# Patient Record
Sex: Female | Born: 1955 | Race: White | Hispanic: No | Marital: Single | Smoking: Former smoker
Health system: Southern US, Community
[De-identification: ages and names within clinical notes are randomized; demographics above are authoritative.]

## PROBLEM LIST (undated history)

## (undated) DIAGNOSIS — I10 Essential (primary) hypertension: Secondary | ICD-10-CM

## (undated) DIAGNOSIS — F200 Paranoid schizophrenia: Secondary | ICD-10-CM

## (undated) DIAGNOSIS — I872 Venous insufficiency (chronic) (peripheral): Secondary | ICD-10-CM

## (undated) DIAGNOSIS — I2699 Other pulmonary embolism without acute cor pulmonale: Secondary | ICD-10-CM

## (undated) DIAGNOSIS — E871 Hypo-osmolality and hyponatremia: Secondary | ICD-10-CM

---

## 2000-09-18 ENCOUNTER — Encounter: Admission: RE | Admit: 2000-09-18 | Discharge: 2000-09-18 | Payer: Self-pay | Admitting: Internal Medicine

## 2000-09-18 ENCOUNTER — Encounter: Payer: Self-pay | Admitting: Internal Medicine

## 2001-08-24 ENCOUNTER — Encounter: Admission: RE | Admit: 2001-08-24 | Discharge: 2001-08-24 | Payer: Self-pay | Admitting: Internal Medicine

## 2001-09-04 ENCOUNTER — Ambulatory Visit: Admission: RE | Admit: 2001-09-04 | Discharge: 2001-09-04 | Payer: Self-pay | Admitting: Internal Medicine

## 2003-04-18 ENCOUNTER — Other Ambulatory Visit: Admission: RE | Admit: 2003-04-18 | Discharge: 2003-04-18 | Payer: Self-pay | Admitting: Internal Medicine

## 2003-04-22 ENCOUNTER — Encounter: Admission: RE | Admit: 2003-04-22 | Discharge: 2003-04-22 | Payer: Self-pay | Admitting: Internal Medicine

## 2003-04-22 ENCOUNTER — Encounter: Payer: Self-pay | Admitting: Internal Medicine

## 2004-04-04 ENCOUNTER — Encounter: Admission: RE | Admit: 2004-04-04 | Discharge: 2004-04-04 | Payer: Self-pay | Admitting: Internal Medicine

## 2004-04-25 ENCOUNTER — Encounter: Admission: RE | Admit: 2004-04-25 | Discharge: 2004-04-25 | Payer: Self-pay | Admitting: Internal Medicine

## 2004-06-26 ENCOUNTER — Other Ambulatory Visit: Admission: RE | Admit: 2004-06-26 | Discharge: 2004-06-26 | Payer: Self-pay | Admitting: Internal Medicine

## 2005-08-08 ENCOUNTER — Other Ambulatory Visit: Admission: RE | Admit: 2005-08-08 | Discharge: 2005-08-08 | Payer: Self-pay | Admitting: Internal Medicine

## 2005-08-15 ENCOUNTER — Encounter: Admission: RE | Admit: 2005-08-15 | Discharge: 2005-08-15 | Payer: Self-pay | Admitting: Internal Medicine

## 2006-09-23 ENCOUNTER — Other Ambulatory Visit: Admission: RE | Admit: 2006-09-23 | Discharge: 2006-09-23 | Payer: Self-pay | Admitting: *Deleted

## 2006-11-03 ENCOUNTER — Other Ambulatory Visit: Admission: RE | Admit: 2006-11-03 | Discharge: 2006-11-03 | Payer: Self-pay | Admitting: Obstetrics and Gynecology

## 2007-10-28 ENCOUNTER — Other Ambulatory Visit: Admission: RE | Admit: 2007-10-28 | Discharge: 2007-10-28 | Payer: Self-pay | Admitting: *Deleted

## 2010-12-21 NOTE — Procedures (Signed)
[facility]  Patient:    Nancy Erickson, Nancy Erickson Visit Number: [id number] MRN: [id number]          Service Type: DSU Location: CARD Attending Physician:  Avie Echevaria Dictated by:   Charlaine Dalton. Sherene Sires, M.D. [facility] Proc. Date: 09/04/01 Admit Date:  09/04/2001                             Procedure Report  REFERRING PHYSICIAN:  Self referred.  HISTORY:  This is a 55 year old white female, smoker, with a history of chronic psychosis and atypical cough that I thought was probably upper airway in nature. She has had a normal chest x-ray and CT scan done within the past several weeks and continues to have coughing paroxysms, hoarseness, and a sensation of "swelling in her throat" that I though was probably, again, reflux related but might be functional but because she was a smoker, wanted to exclude the possibility of an upper airway mechanical abnormality or neoplasm. I discussed these issues further with the patient and her mother on August 18, 2001 and obtained consent for a procedure today after full discussion of risks, benefits, and alternatives.  PROCEDURE:  Fiberoptic bronchoscopy.  DESCRIPTION OF PROCEDURE:  The procedure was performed in the bronchoscopy suite with continuous monitoring by surface ECG and oximetry. The patient maintained greater than 90% saturation throughout the procedure in sinus rhythm. She received a total of 5 mg of IV Versed and 50 mg IV Demerol for adequate sedation and cough suppression.  The patient was premedicated with 1% lidocaine aerosol by _______ nebulizer. The right nares was additionally prepared with 2% lidocaine jelly.  Using a standard video fiberoptic bronchoscope, the right nares was easily cannulated with good visualization of the entire oropharynx and larynx. The cords moved normally on inspiration. On expiration, there was marked adduction of the vocal cords to the point where she had classic  "pseudo-wheeze" visualized that corresponded to upper airway wheezing that was audible with and without the stethoscope. In the absence of the vocal cord adduction that occurred paroxysmally on expiration, there was no wheezing at all nor any coughing.  However, the larynx and vocal cords were perfectly normal otherwise.  Using an additional 1% lidocaine as indicated, the entire tracheobronchial tree was explored bilaterally with the following findings: 1. Trachea, carina, and all the major airways to the subsegmental level were    widely patent with normal mucosa, no evidence of excessive secretions or    mucosal abnormalities to explain her chronic cough.  IMPRESSION:  Probable functional upper airway coughing/vocal cord dysfunction.  RECOMMENDATION:  The patient will be taught to how to use a flutter valve to help during paroxysms of cough and dyspnea and will see her back in the office in the next one to two weeks to see how she is doing with this. If this is not helpful, refer to the [facility] will be considered. Dictated by:   Charlaine Dalton. Sherene Sires, M.D. [facility] Attending Physician:  Avie Echevaria DD:  09/04/01 TD:  09/05/01 Job: [id number]

## 2011-09-04 DIAGNOSIS — F209 Schizophrenia, unspecified: Secondary | ICD-10-CM | POA: Diagnosis not present

## 2011-09-24 DIAGNOSIS — Z79899 Other long term (current) drug therapy: Secondary | ICD-10-CM | POA: Diagnosis not present

## 2012-03-10 DIAGNOSIS — F209 Schizophrenia, unspecified: Secondary | ICD-10-CM | POA: Diagnosis not present

## 2012-05-13 DIAGNOSIS — Z23 Encounter for immunization: Secondary | ICD-10-CM | POA: Diagnosis not present

## 2012-06-09 DIAGNOSIS — F209 Schizophrenia, unspecified: Secondary | ICD-10-CM | POA: Diagnosis not present

## 2012-09-08 DIAGNOSIS — F209 Schizophrenia, unspecified: Secondary | ICD-10-CM | POA: Diagnosis not present

## 2012-09-11 DIAGNOSIS — L259 Unspecified contact dermatitis, unspecified cause: Secondary | ICD-10-CM | POA: Diagnosis not present

## 2012-12-08 DIAGNOSIS — F209 Schizophrenia, unspecified: Secondary | ICD-10-CM | POA: Diagnosis not present

## 2013-03-02 DIAGNOSIS — F209 Schizophrenia, unspecified: Secondary | ICD-10-CM | POA: Diagnosis not present

## 2013-06-01 DIAGNOSIS — F209 Schizophrenia, unspecified: Secondary | ICD-10-CM | POA: Diagnosis not present

## 2013-10-11 ENCOUNTER — Inpatient Hospital Stay (HOSPITAL_COMMUNITY)
Admission: EM | Admit: 2013-10-11 | Discharge: 2013-10-14 | DRG: 305 | Disposition: A | Discharge status: Home / Self Care | Payer: Medicare Other | Attending: Family Medicine | Admitting: Family Medicine

## 2013-10-11 ENCOUNTER — Encounter (HOSPITAL_COMMUNITY): Payer: Self-pay | Admitting: Emergency Medicine

## 2013-10-11 ENCOUNTER — Emergency Department (HOSPITAL_COMMUNITY): Payer: Medicare Other

## 2013-10-11 DIAGNOSIS — Z885 Allergy status to narcotic agent status: Secondary | ICD-10-CM | POA: Diagnosis not present

## 2013-10-11 DIAGNOSIS — I498 Other specified cardiac arrhythmias: Secondary | ICD-10-CM | POA: Diagnosis present

## 2013-10-11 DIAGNOSIS — R4182 Altered mental status, unspecified: Secondary | ICD-10-CM | POA: Diagnosis not present

## 2013-10-11 DIAGNOSIS — Z79899 Other long term (current) drug therapy: Secondary | ICD-10-CM | POA: Diagnosis not present

## 2013-10-11 DIAGNOSIS — Z598 Other problems related to housing and economic circumstances: Secondary | ICD-10-CM | POA: Diagnosis not present

## 2013-10-11 DIAGNOSIS — Z888 Allergy status to other drugs, medicaments and biological substances status: Secondary | ICD-10-CM

## 2013-10-11 DIAGNOSIS — F250 Schizoaffective disorder, bipolar type: Secondary | ICD-10-CM | POA: Diagnosis not present

## 2013-10-11 DIAGNOSIS — I10 Essential (primary) hypertension: Secondary | ICD-10-CM | POA: Diagnosis not present

## 2013-10-11 DIAGNOSIS — R000 Tachycardia, unspecified: Secondary | ICD-10-CM

## 2013-10-11 DIAGNOSIS — I160 Hypertensive urgency: Secondary | ICD-10-CM | POA: Diagnosis present

## 2013-10-11 DIAGNOSIS — R631 Polydipsia: Secondary | ICD-10-CM | POA: Diagnosis present

## 2013-10-11 DIAGNOSIS — F209 Schizophrenia, unspecified: Secondary | ICD-10-CM | POA: Diagnosis not present

## 2013-10-11 DIAGNOSIS — F200 Paranoid schizophrenia: Secondary | ICD-10-CM | POA: Diagnosis not present

## 2013-10-11 DIAGNOSIS — F259 Schizoaffective disorder, unspecified: Secondary | ICD-10-CM | POA: Diagnosis present

## 2013-10-11 DIAGNOSIS — E871 Hypo-osmolality and hyponatremia: Secondary | ICD-10-CM | POA: Diagnosis present

## 2013-10-11 DIAGNOSIS — F17200 Nicotine dependence, unspecified, uncomplicated: Secondary | ICD-10-CM | POA: Diagnosis present

## 2013-10-11 DIAGNOSIS — I517 Cardiomegaly: Secondary | ICD-10-CM | POA: Diagnosis not present

## 2013-10-11 DIAGNOSIS — F29 Unspecified psychosis not due to a substance or known physiological condition: Secondary | ICD-10-CM | POA: Diagnosis not present

## 2013-10-11 DIAGNOSIS — Z5987 Material hardship due to limited financial resources, not elsewhere classified: Secondary | ICD-10-CM

## 2013-10-11 DIAGNOSIS — R0602 Shortness of breath: Secondary | ICD-10-CM | POA: Diagnosis not present

## 2013-10-11 HISTORY — DX: Paranoid schizophrenia: F20.0

## 2013-10-11 LAB — CBC WITH DIFFERENTIAL/PLATELET
Basophils Absolute: 0 10*3/uL (ref 0.0–0.1)
Basophils Relative: 0 % (ref 0–1)
Eosinophils Absolute: 0.1 10*3/uL (ref 0.0–0.7)
Eosinophils Relative: 1 % (ref 0–5)
HCT: 39 % (ref 36.0–46.0)
Hemoglobin: 14.2 g/dL (ref 12.0–15.0)
Lymphocytes Relative: 24 % (ref 12–46)
Lymphs Abs: 2 K/uL (ref 0.7–4.0)
MCH: 30.7 pg (ref 26.0–34.0)
MCHC: 36.4 g/dL — ABNORMAL HIGH (ref 30.0–36.0)
MCV: 84.4 fL (ref 78.0–100.0)
Monocytes Absolute: 0.6 K/uL (ref 0.1–1.0)
Monocytes Relative: 8 % (ref 3–12)
Neutro Abs: 5.6 10*3/uL (ref 1.7–7.7)
Neutrophils Relative %: 67 % (ref 43–77)
Platelets: 427 10*3/uL — ABNORMAL HIGH (ref 150–400)
RBC: 4.62 MIL/uL (ref 3.87–5.11)
RDW: 13.4 % (ref 11.5–15.5)
WBC: 8.3 10*3/uL (ref 4.0–10.5)

## 2013-10-11 LAB — BASIC METABOLIC PANEL
BUN: <3 mg/dL — ABNORMAL LOW (ref 6–23)
CHLORIDE: 99 meq/L (ref 96–112)
CO2: 28 meq/L (ref 19–32)
Calcium: 9.3 mg/dL (ref 8.4–10.5)
Creatinine, Ser: 0.55 mg/dL (ref 0.50–1.10)
GFR calc Af Amer: >90 mL/min (ref >90)
GFR calc non Af Amer: >90 mL/min (ref >90)
Glucose, Bld: 109 mg/dL — ABNORMAL HIGH (ref 70–99)
POTASSIUM: 3.8 meq/L (ref 3.7–5.3)
Sodium: 138 mEq/L (ref 137–147)

## 2013-10-11 LAB — I-STAT CHEM 8, ED
BUN: <3 mg/dL — ABNORMAL LOW (ref 6–23)
Calcium, Ion: 1.13 mmol/L (ref 1.12–1.23)
Chloride: 91 mEq/L — ABNORMAL LOW (ref 96–112)
Creatinine, Ser: 0.5 mg/dL (ref 0.50–1.10)
Glucose, Bld: 116 mg/dL — ABNORMAL HIGH (ref 70–99)
HCT: 44 % (ref 36.0–46.0)
Hemoglobin: 15 g/dL (ref 12.0–15.0)
Potassium: 3.6 meq/L — ABNORMAL LOW (ref 3.7–5.3)
Sodium: 129 meq/L — ABNORMAL LOW (ref 137–147)
TCO2: 26 mmol/L (ref 0–100)

## 2013-10-11 LAB — COMPREHENSIVE METABOLIC PANEL
ALT: 21 U/L (ref 0–35)
AST: 21 U/L (ref 0–37)
CO2: 24 mEq/L (ref 19–32)
Chloride: 89 mEq/L — ABNORMAL LOW (ref 96–112)
GFR calc non Af Amer: >90 mL/min (ref >90)
Sodium: 128 mEq/L — ABNORMAL LOW (ref 137–147)
Total Bilirubin: 0.4 mg/dL (ref 0.3–1.2)

## 2013-10-11 LAB — COMPREHENSIVE METABOLIC PANEL WITH GFR
Albumin: 4.1 g/dL (ref 3.5–5.2)
Alkaline Phosphatase: 68 U/L (ref 39–117)
BUN: <3 mg/dL — ABNORMAL LOW (ref 6–23)
Calcium: 9.4 mg/dL (ref 8.4–10.5)
Creatinine, Ser: 0.48 mg/dL — ABNORMAL LOW (ref 0.50–1.10)
GFR calc Af Amer: >90 mL/min (ref >90)
Glucose, Bld: 114 mg/dL — ABNORMAL HIGH (ref 70–99)
Potassium: 3.8 meq/L (ref 3.7–5.3)
Total Protein: 6.9 g/dL (ref 6.0–8.3)

## 2013-10-11 LAB — RAPID URINE DRUG SCREEN, HOSP PERFORMED
AMPHETAMINES: NOT DETECTED
Barbiturates: NOT DETECTED
Benzodiazepines: NOT DETECTED
COCAINE: NOT DETECTED
OPIATES: NOT DETECTED
Tetrahydrocannabinol: NOT DETECTED

## 2013-10-11 LAB — URINALYSIS, ROUTINE W REFLEX MICROSCOPIC
BILIRUBIN URINE: NEGATIVE
Glucose, UA: NEGATIVE mg/dL
HGB URINE DIPSTICK: NEGATIVE
Ketones, ur: NEGATIVE mg/dL
Leukocytes, UA: NEGATIVE
Nitrite: NEGATIVE
PROTEIN: NEGATIVE mg/dL
Specific Gravity, Urine: 1.005 (ref 1.005–1.030)
Urobilinogen, UA: 0.2 mg/dL (ref 0.0–1.0)
pH: 7.5 (ref 5.0–8.0)

## 2013-10-11 LAB — ETHANOL

## 2013-10-11 LAB — POC URINE PREG, ED: Preg Test, Ur: NEGATIVE

## 2013-10-11 MED ORDER — LORAZEPAM 2 MG/ML IJ SOLN
0.5000 mg | Freq: Once | INTRAMUSCULAR | Status: AC
  Administered 2013-10-11: 0.5 mg via INTRAVENOUS
  Filled 2013-10-11: qty 1

## 2013-10-11 MED ORDER — ACETAMINOPHEN 650 MG RE SUPP: 650.0000 mg | Freq: Four times a day (QID) | RECTAL | Status: DC | PRN

## 2013-10-11 MED ORDER — HEPARIN SODIUM (PORCINE) 5000 UNIT/ML IJ SOLN
5000.0000 [IU] | Freq: Three times a day (TID) | INTRAMUSCULAR | Status: DC
  Administered 2013-10-12 – 2013-10-13 (×3): 5000 [IU] via SUBCUTANEOUS
  Filled 2013-10-11 (×10): qty 1

## 2013-10-11 MED ORDER — LORAZEPAM 2 MG/ML IJ SOLN
1.0000 mg | Freq: Once | INTRAMUSCULAR | Status: AC
  Administered 2013-10-11: 20:00:00 via INTRAVENOUS
  Filled 2013-10-11: qty 1

## 2013-10-11 MED ORDER — SODIUM CHLORIDE 0.9 % IV BOLUS (SEPSIS)
1000.0000 mL | Freq: Once | INTRAVENOUS | Status: AC
  Administered 2013-10-11: 1000 mL via INTRAVENOUS

## 2013-10-11 MED ORDER — BENZTROPINE MESYLATE 1 MG PO TABS
1.0000 mg | ORAL_TABLET | Freq: Every day | ORAL | Status: DC
  Administered 2013-10-12 – 2013-10-14 (×3): 1 mg via ORAL
  Filled 2013-10-11 (×3): qty 1

## 2013-10-11 MED ORDER — ACETAMINOPHEN 325 MG PO TABS: 650.0000 mg | ORAL_TABLET | Freq: Four times a day (QID) | ORAL | Status: DC | PRN

## 2013-10-11 MED ORDER — ONDANSETRON HCL 4 MG PO TABS: 4.0000 mg | ORAL_TABLET | Freq: Four times a day (QID) | ORAL | Status: DC | PRN

## 2013-10-11 MED ORDER — ONDANSETRON HCL 4 MG/2ML IJ SOLN: 4.0000 mg | Freq: Four times a day (QID) | INTRAMUSCULAR | Status: DC | PRN

## 2013-10-11 MED ORDER — SODIUM CHLORIDE 0.9 % IJ SOLN
3.0000 mL | Freq: Two times a day (BID) | INTRAMUSCULAR | Status: DC
  Administered 2013-10-11 – 2013-10-13 (×5): 3 mL via INTRAVENOUS

## 2013-10-11 MED ORDER — HYDRALAZINE HCL 20 MG/ML IJ SOLN
10.0000 mg | INTRAMUSCULAR | Status: DC | PRN
  Administered 2013-10-11: 22:00:00 via INTRAVENOUS
  Filled 2013-10-11: qty 1

## 2013-10-11 MED ORDER — METOPROLOL TARTRATE 1 MG/ML IV SOLN
5.0000 mg | Freq: Once | INTRAVENOUS | Status: AC
  Administered 2013-10-11: 5 mg via INTRAVENOUS
  Filled 2013-10-11: qty 5

## 2013-10-11 MED ORDER — METOPROLOL TARTRATE 1 MG/ML IV SOLN: 5.0000 mg | Freq: Four times a day (QID) | INTRAVENOUS | Status: DC | PRN

## 2013-10-11 MED ORDER — LABETALOL HCL 100 MG PO TABS
100.0000 mg | ORAL_TABLET | Freq: Once | ORAL | Status: AC
  Administered 2013-10-11: 100 mg via ORAL
  Filled 2013-10-11: qty 1

## 2013-10-11 MED ORDER — AMLODIPINE BESYLATE 5 MG PO TABS
5.0000 mg | ORAL_TABLET | Freq: Every day | ORAL | Status: DC
  Administered 2013-10-11 – 2013-10-13 (×3): 5 mg via ORAL
  Filled 2013-10-11 (×3): qty 1

## 2013-10-11 MED ORDER — RISPERIDONE 3 MG PO TABS
6.0000 mg | ORAL_TABLET | Freq: Every day | ORAL | Status: DC
  Administered 2013-10-12 – 2013-10-13 (×2): 6 mg via ORAL
  Filled 2013-10-11 (×3): qty 2

## 2013-10-11 NOTE — ED Notes (Addendum)
Pt presents to department for evaluation of hypertension and confusion. History of paranoid schizophrenia. Family states she is more confused than normal, noticed over the past week, family is concerned she has not been taking medications as directed. Pt has chronic cough and speaking difficulties, but this is normal baseline. Denies pain. Pt is alert, but confused.

## 2013-10-11 NOTE — ED Notes (Signed)
Patel, MD at bedside.  

## 2013-10-11 NOTE — ED Notes (Signed)
[facility]  Pt to go to floor on monitor with EDT accompanying.

## 2013-10-11 NOTE — ED Notes (Signed)
Pt voided >800 ml urine which fell from commode to floor spilling all over one half of the floor from head to foot of bed and to wall.

## 2013-10-11 NOTE — H&P (Signed)
[facility] History and Physical  Patient: Nancy Erickson  ZOX:[id number]  DOB: Jun 27, 1956  DOS: the patient was seen and examined on 10/11/2013 PCP: No primary provider on file.  Chief Complaint: Altered mental status  HPI: Nancy Erickson is a 58 y.o. female with Past medical history of paranoid schizophrenia. The patient is coming from supervised group living facility. The history was obtained from patient as well as Merchandiser, [employer]. As per the supervisor since last one week the patient has not been acting herself. She is more confused. She is knocking on her neighbors to her every night multiple times asking for cigarettes, despite informing her that the neighbors do not have it. She will be out in cold without any protective clothing. She has a prescription that should continue until 20th of March, but she is on her last medications today. She has been having episodes of vomiting. No incontinence noted no syncope or passing episode. No seizure-like activity noted. Patient today was more agitated and was taken to urgent care and was found to have significantly elevated blood pressure and then brought here. There is no alcohol, cigarettes, othe prescription medications identified in her apartment.  Review of Systems: as mentioned in the history of present illness.  A Comprehensive review of the other systems is negative.  Past Medical History  Diagnosis Date  . Paranoid schizophrenia    History reviewed. No pertinent past surgical history. Social History:  reports that she has been smoking Cigarettes.  She has been smoking about 0.00 packs per day. She does not have any smokeless tobacco history on file. She reports that she does not drink alcohol or use illicit drugs. Independent for most of her  ADL.  Allergies  Allergen Reactions  . Phosphate Nausea And Vomiting    No family history on file.  Prior to Admission medications   Medication Sig Start Date End Date Taking?  Authorizing Provider  benztropine (COGENTIN) 1 MG tablet Take 1 mg by mouth daily. 09/23/13  Yes Historical Provider, MD  Multiple Vitamins-Minerals (MULTIVITAMIN WITH MINERALS) tablet Take 1 tablet by mouth daily.   Yes Historical Provider, MD  risperiDONE (RISPERDAL) 3 MG tablet Take 6 mg by mouth at bedtime.  09/22/13  Yes Historical Provider, MD    Physical Exam: Filed Vitals:   10/11/13 2140 10/11/13 2145 10/11/13 2215 10/11/13 2216  BP: 190/94 177/95 193/91 193/91  Pulse:  100  106  Temp:      TempSrc:      Resp: 16 28  28   SpO2: 98% 95%  96%    General: Alert, Awake and Oriented to Time, Place and Person. Appear in mild distress, Slurred speech which is chronic. Eyes: PERRL ENT: Oral Mucosa clear moist. Neck: No JVD Cardiovascular: S1 and S2 Present, no Murmur, Peripheral Pulses Present Respiratory: Bilateral Air entry equal and Decreased, Clear to Auscultation,  No Crackles, no wheezes Abdomen: Bowel Sound Present, Soft and Non tender Skin: No Rash Extremities: No Pedal edema, no calf tenderness Neurologic: Grossly Unremarkable. Labs on Admission:  CBC:  Recent Labs Lab 10/11/13 1417 10/11/13 1440  WBC 8.3  --   NEUTROABS 5.6  --   HGB 14.2 15.0  HCT 39.0 44.0  MCV 84.4  --   PLT 427*  --     CMP     Component Value Date/Time   NA 138 10/11/2013 2133   K 3.8 10/11/2013 2133   CL 99 10/11/2013 2133   CO2 28 10/11/2013 2133   GLUCOSE 109* 10/11/2013  2133   BUN <3* 10/11/2013 2133   CREATININE 0.55 10/11/2013 2133   CALCIUM 9.3 10/11/2013 2133   PROT 6.9 10/11/2013 1417   ALBUMIN 4.1 10/11/2013 1417   AST 21 10/11/2013 1417   ALT 21 10/11/2013 1417   ALKPHOS 68 10/11/2013 1417   BILITOT 0.4 10/11/2013 1417   GFRNONAA >90 10/11/2013 2133   GFRAA >90 10/11/2013 2133    No results found for this basename: LIPASE, AMYLASE,  in the last 168 hours No results found for this basename: AMMONIA,  in the last 168 hours  No results found for this basename: CKTOTAL, CKMB, CKMBINDEX,  TROPONINI,  in the last 168 hours BNP (last 3 results) No results found for this basename: PROBNP,  in the last 8760 hours  Radiological Exams on Admission: Ct Head Wo Contrast  10/11/2013   CLINICAL DATA:  Hypertension and confusion. History of paranoid schizophrenia.  EXAM: CT HEAD WITHOUT CONTRAST  TECHNIQUE: Contiguous axial images were obtained from the base of the skull through the vertex without intravenous contrast.  COMPARISON:  No priors.  FINDINGS: In the right temporal lobe (images 9-12 of series 2) there are areas of low attenuation, with apparent loss of gray-white differentiation. This region is somewhat affected by beam hardening artifact from skullbase, however, the possibility of edema from subacute ischemia in this region is difficult to entirely exclude. The remainder the brain is otherwise normal in appearance. Specifically, no signs of acute intracranial hemorrhage. No definite mass or mass effect. No hydrocephalus. Visualized paranasal sinuses and mastoids are well pneumatized. No acute displaced skull fractures are identified.  IMPRESSION: 1. Findings that could potentially be seen in the setting of subacute ischemia in the right MCA distribution, specifically in the right temporal lobe, as above. Although this may be artifactual, clinical correlation is recommended, and if there is any clinical concern for acute ischemia in this region, further evaluation with brain MRI would be recommended. These results were called by telephone at the time of interpretation on 10/11/2013 at 4:14 PM to Dr. Glynn OctaveSTEPHEN RANCOUR, who verbally acknowledged these results.   Electronically Signed   By: Trudie Reedaniel  Entrikin M.D.   On: 10/11/2013 16:17   Mr Brain Wo Contrast  10/11/2013   CLINICAL DATA:  Altered mental status.  Schizophrenia.  Abnormal CT  EXAM: MRI HEAD WITHOUT CONTRAST  TECHNIQUE: Multiplanar, multiecho pulse sequences of the brain and surrounding structures were obtained without intravenous  contrast.  COMPARISON:  CT head from today  FINDINGS: Incomplete study. The patient was unable to tolerate scanning. Sagittal T1, axial and coronal diffusion-weighted imaging was performed. No T2 weighted imaging. There is motion on the images  Negative for acute infarct. Ventricle size is normal. Right temporal lobe appears normal on diffusion-weighted imaging. Obviously a more complete examination would be helpful however I feel based on the study that the right temporal lobe hypodensity on CT is most likely due to artifact. If symptoms persist, repeat CT or MRI could be performed.  IMPRESSION: No acute abnormality.  No areas of acute infarct identified.  Incomplete study   Electronically Signed   By: Marlan Palauharles  Clark M.D.   On: 10/11/2013 18:24    EKG: Independently reviewed. normal EKG, normal sinus rhythm, sinus tachycardia.  Assessment/Plan Principal Problem:   Hypertensive urgency Active Problems:   Schizo-affective schizophrenia   Hyponatremia   Altered mental state   1. Hypertensive urgency The patient is presenting with significant able to blood pressure with sinus tachycardia. Although she does not  appear to have any signs of withdrawal or other symptoms suggesting serotonin syndrome or any other etiologies. It is also unclear whether she has chronic hypertension which has not been treated. At present I would place her on IV hydralazine IV Lopressor as needed. I will give her one dose of oral labetalol and one dose of amlodipine. U. tox is negative for any substance. CT head is negative for any stroke. Initial troponin is negative. Once medically clear may need psychiatric consultation for schizoaffective disorder.  2. Hyponatremia The supervisor reports that the patient drinks 4 L of soda on a daily basis. I will recheck her BMP. Neuro checks every 4 hours  3. Altered mental status At present the patient appears to be at her baseline pleasantly confused. May need psychiatric  consultation. CT scan is normal urine analysis is normal U. tox is normal LFTs are normal.   DVT Prophylaxis: subcutaneous Heparin Nutrition: Advance as tolerated  Code Status: Full  Family Communication: Brother was present at bedside, opportunity was given to ask question and all questions were answered satisfactorily at the time of interview. Disposition: Admitted to observation in telemetry unit.  Author: Lynden Oxford, MD [facility] Pager: [phone] 10/11/2013, 10:20 PM    If 7PM-7AM, please contact night-coverage [email or web address] Password [email or web address]

## 2013-10-11 NOTE — ED Provider Notes (Signed)
CSN: [id number]     Arrival date & time 10/11/13  1401 History   First MD Initiated Contact with Patient 10/11/13 1504     Chief Complaint  Patient presents with  . Hypertension  . Altered Mental Status     (Consider location/radiation/quality/duration/timing/severity/associated sxs/prior Treatment) Patient is a 58 y.o. female presenting with hypertension and altered mental status. The history is provided by the patient, a caregiver and medical records. No language interpreter was used.  Hypertension Pertinent negatives include no abdominal pain, chest pain, coughing, diaphoresis, fatigue, fever, headaches, nausea, rash or vomiting.  Altered Mental Status Associated symptoms: no abdominal pain, no fever, no headaches, no light-headedness, no nausea, no rash and no vomiting     Lizvette Lightsey is a 58 y.o. female  with a hx of paranoid schizophrenia presents to the Emergency Department with her community mentor after being found more altered than normal onset 1 week ago.  Community mentor reports that pt has only 1 more day of her Risperidone and benzotripine RX, but they were not due to be refilled until 10/22/13.  Mentor reports that people close to her have reported that she has not been herself.  Mentor reports that she has been hospitalized for her schizophrenia before, but it has been many years since.  Mentor also reports that patient has been throwing things away this week like her cell phone, therefore it is unclear whether or not the patient has taken too much medication or not enough this week.  Pt denies all somatic complaints and reports she is taking her medications.  She denies drug or EtOH use. Pt able to provide very little of her own Hx due to her psychosis - Level 5 caveat.    Past Medical History  Diagnosis Date  . Paranoid schizophrenia    History reviewed. No pertinent past surgical history. No family history on file. History  Substance Use Topics  . Smoking status:  Current Every Day Smoker    Types: Cigarettes  . Smokeless tobacco: Not on file  . Alcohol Use: No   OB History   Grav Para Term Preterm Abortions TAB SAB Ect Mult Living                 Review of Systems  Constitutional: Negative for fever, diaphoresis, appetite change, fatigue and unexpected weight change.  HENT: Negative for mouth sores.   Eyes: Negative for visual disturbance.  Respiratory: Negative for cough, chest tightness, shortness of breath and wheezing.   Cardiovascular: Negative for chest pain.  Gastrointestinal: Negative for nausea, vomiting, abdominal pain, diarrhea and constipation.  Endocrine: Negative for polydipsia, polyphagia and polyuria.  Genitourinary: Negative for dysuria, urgency, frequency and hematuria.  Musculoskeletal: Negative for back pain and neck stiffness.  Skin: Negative for rash.  Allergic/Immunologic: Negative for immunocompromised state.  Neurological: Negative for syncope, light-headedness and headaches.  Hematological: Does not bruise/bleed easily.  Psychiatric/Behavioral: Negative for sleep disturbance. The patient is not nervous/anxious.       Allergies  Phosphate  Home Medications   Current Outpatient Rx  Name  Route  Sig  Dispense  Refill  . benztropine (COGENTIN) 1 MG tablet   Oral   Take 1 mg by mouth daily.         . Multiple Vitamins-Minerals (MULTIVITAMIN WITH MINERALS) tablet   Oral   Take 1 tablet by mouth daily.         . risperiDONE (RISPERDAL) 3 MG tablet   Oral   Take 6 mg  by mouth at bedtime.           BP 189/112  Pulse 80  Temp(Src) 98.2 F (36.8 C) (Oral)  Resp 18  SpO2 99% Physical Exam  Nursing note and vitals reviewed. Constitutional: She is oriented to person, place, and time. She appears well-developed and well-nourished. No distress.  Awake, alert, nontoxic appearance  HENT:  Head: Normocephalic and atraumatic.  Mouth/Throat: Oropharynx is clear and moist. No oropharyngeal exudate.   Eyes: Conjunctivae and EOM are normal. Pupils are equal, round, and reactive to light. No scleral icterus.  Neck: Normal range of motion. Neck supple.  Cardiovascular: Regular rhythm, normal heart sounds and intact distal pulses.   No murmur heard. Tachycardia to 120  Pulmonary/Chest: Effort normal and breath sounds normal. No respiratory distress. She has no wheezes. She has no rales.  Clear and equal breath sounds  Abdominal: Soft. Bowel sounds are normal. She exhibits no distension and no mass. There is no tenderness. There is no rebound and no guarding.  Soft and nontender abd  Musculoskeletal: Normal range of motion. She exhibits no edema.  Lymphadenopathy:    She has no cervical adenopathy.  Neurological: She is alert and oriented to person, place, and time. She has normal reflexes. No cranial nerve deficit. She exhibits normal muscle tone. Coordination normal. GCS eye subscore is 4. GCS verbal subscore is 5. GCS motor subscore is 6.  Speech is goal oriented, follows commands Cranial nerves III - XII without deficit, no facial droop Normal strength in upper and lower extremities bilaterally, strong and equal grip strength Sensation normal to light and sharp touch Moves extremities without ataxia, coordination intact Normal finger to nose and rapid alternating movements Neg romberg, no pronator drift Normal gait No clonus  Skin: Skin is warm and dry. No rash noted. She is not diaphoretic. No erythema.  No erythema or induration  Psychiatric: Her behavior is normal. Her speech is rapid and/or pressured. She expresses no homicidal and no suicidal ideation.  Denies SI/HI Denies auditory hallucinations    ED Course  Procedures (including critical care time) Labs Review Labs Reviewed  CBC WITH DIFFERENTIAL - Abnormal; Notable for the following:    MCHC 36.4 (*)    Platelets 427 (*)    All other components within normal limits  COMPREHENSIVE METABOLIC PANEL - Abnormal; Notable  for the following:    Sodium 128 (*)    Chloride 89 (*)    Glucose, Bld 114 (*)    BUN <3 (*)    Creatinine, Ser 0.48 (*)    All other components within normal limits  I-STAT CHEM 8, ED - Abnormal; Notable for the following:    Sodium 129 (*)    Potassium 3.6 (*)    Chloride 91 (*)    BUN <3 (*)    Glucose, Bld 116 (*)    All other components within normal limits  ETHANOL  URINE RAPID DRUG SCREEN (HOSP PERFORMED)  URINALYSIS, ROUTINE W REFLEX MICROSCOPIC  CK  MYOGLOBIN, URINE  TSH  T4, FREE  POC URINE PREG, ED   Imaging Review Ct Head Wo Contrast  10/11/2013   CLINICAL DATA:  Hypertension and confusion. History of paranoid schizophrenia.  EXAM: CT HEAD WITHOUT CONTRAST  TECHNIQUE: Contiguous axial images were obtained from the base of the skull through the vertex without intravenous contrast.  COMPARISON:  No priors.  FINDINGS: In the right temporal lobe (images 9-12 of series 2) there are areas of low attenuation, with apparent loss  of gray-white differentiation. This region is somewhat affected by beam hardening artifact from skullbase, however, the possibility of edema from subacute ischemia in this region is difficult to entirely exclude. The remainder the brain is otherwise normal in appearance. Specifically, no signs of acute intracranial hemorrhage. No definite mass or mass effect. No hydrocephalus. Visualized paranasal sinuses and mastoids are well pneumatized. No acute displaced skull fractures are identified.  IMPRESSION: 1. Findings that could potentially be seen in the setting of subacute ischemia in the right MCA distribution, specifically in the right temporal lobe, as above. Although this may be artifactual, clinical correlation is recommended, and if there is any clinical concern for acute ischemia in this region, further evaluation with brain MRI would be recommended. These results were called by telephone at the time of interpretation on 10/11/2013 at 4:14 PM to Dr. Glynn Octave, who verbally acknowledged these results.   Electronically Signed   By: Trudie Reed M.D.   On: 10/11/2013 16:17   Mr Brain Wo Contrast  10/11/2013   CLINICAL DATA:  Altered mental status.  Schizophrenia.  Abnormal CT  EXAM: MRI HEAD WITHOUT CONTRAST  TECHNIQUE: Multiplanar, multiecho pulse sequences of the brain and surrounding structures were obtained without intravenous contrast.  COMPARISON:  CT head from today  FINDINGS: Incomplete study. The patient was unable to tolerate scanning. Sagittal T1, axial and coronal diffusion-weighted imaging was performed. No T2 weighted imaging. There is motion on the images  Negative for acute infarct. Ventricle size is normal. Right temporal lobe appears normal on diffusion-weighted imaging. Obviously a more complete examination would be helpful however I feel based on the study that the right temporal lobe hypodensity on CT is most likely due to artifact. If symptoms persist, repeat CT or MRI could be performed.  IMPRESSION: No acute abnormality.  No areas of acute infarct identified.  Incomplete study   Electronically Signed   By: Marlan Palau M.D.   On: 10/11/2013 18:24     EKG Interpretation   Date/Time:  Monday October 11 2013 14:15:52 EDT Ventricular Rate:  125 PR Interval:  146 QRS Duration: 70 QT Interval:  302 QTC Calculation: 435 R Axis:   80 Text Interpretation:  Sinus tachycardia with occasional Premature  ventricular complexes Possible Left atrial enlargement Abnormal ECG No  previous tracing Confirmed by [facility]  MD, Russella Dar ([id number]) on 10/11/2013  4:31:58 PM      MDM   Final diagnoses:  HTN (hypertension)  Tachycardia  Altered mental status  Schizophrenia, paranoid type   Rush Landmark presents with rapid and pressured speech, HTN and tachycardia. She has had an alteration in her medications but it is unclear if this was too much or too little.  Pt is A&Ox4, but unable to provide much history for me.  Perhaps she is  withdrawing from something vs an exacerbation of her schizophrenia, but her HTN is concerning.  Will give ativan and reassess.    Pt labs unremarkable, she remains tachycardic.  CT with low attenuation areas of the right temporal lobe, questionable for subacute ischemia.  Will MRI.  MRI incomplete, but without evidence of acute infarct.  Pt continues to be tachycardic and hypertensive.  No evidence of hypertensive urgency as pt remains asymptomatic.  Will give metoprolol.  Myoglobin pending, but pt is without fever or clonus to indicate anti-NMDA receptor encephalopathy.   I personally reviewed the imaging tests through PACS system  I reviewed available ER/hospitalization records through the EMR  Pt with improved HR,  but persistent HTN.  Will consult for admission until vitals have stabilized enough for psychiatry consult.    PCP: [facility] [place]  BP 189/112  Pulse 80  Temp(Src) 98.2 F (36.8 C) (Oral)  Resp 18  SpO2 99%   Dierdre ForthHannah Trenika Hudson, PA-C 10/11/13 2048

## 2013-10-11 NOTE — ED Provider Notes (Signed)
Medical screening examination/treatment/procedure(s) were conducted as a shared visit with non-physician practitioner(s) and myself.  I personally evaluated the patient during the encounter.   EKG Interpretation   Date/Time:  Monday October 11 2013 14:15:52 EDT Ventricular Rate:  125 PR Interval:  146 QRS Duration: 70 QT Interval:  302 QTC Calculation: 435 R Axis:   80 Text Interpretation:  Sinus tachycardia with occasional Premature  ventricular complexes Possible Left atrial enlargement Abnormal ECG No  previous tracing Confirmed by [facility]  MD, MICHEAL ([id number]) on 10/11/2013  4:31:58 PM      Pt with h/o schizophrenia, has been acting differently for > 1 week, noted by other tenants.  Speech is slurred but at baseline per apartment attendant here with her.   Pt is pressured, memory poor, seems manic almost at present, not paranoid.  No arm drift, no facial droop.  Head CT is abn.  Will get follow up MRI.  Plan to obtain MRI, likely will need medical admission with psych consultation while inpt.     6:53 PM MRI was incomplete, but essentially no acute stroke.  Clinically, pt doesn't have focal weakness or arm drift or facial droop to suggest CVA.  Will give more ativan and metoprolol for abn VS, but I suspect will need medical consult for admission, or at the very least clearance prior to psych assessment as I do not feel pt is medically cleared.    Gavin PoundMichael Y. Oletta LamasGhim, MD 10/11/13 [id number]

## 2013-10-11 NOTE — ED Notes (Signed)
Transporting patient to new room assignment. 

## 2013-10-12 DIAGNOSIS — E871 Hypo-osmolality and hyponatremia: Secondary | ICD-10-CM | POA: Diagnosis present

## 2013-10-12 DIAGNOSIS — R000 Tachycardia, unspecified: Secondary | ICD-10-CM

## 2013-10-12 DIAGNOSIS — R4182 Altered mental status, unspecified: Secondary | ICD-10-CM | POA: Diagnosis present

## 2013-10-12 DIAGNOSIS — I517 Cardiomegaly: Secondary | ICD-10-CM

## 2013-10-12 DIAGNOSIS — F209 Schizophrenia, unspecified: Secondary | ICD-10-CM | POA: Diagnosis present

## 2013-10-12 LAB — TSH: TSH: 1.584 u[IU]/mL (ref 0.350–4.500)

## 2013-10-12 LAB — BASIC METABOLIC PANEL
BUN: 3 mg/dL — AB (ref 6–23)
BUN: 4 mg/dL — ABNORMAL LOW (ref 6–23)
BUN: 5 mg/dL — ABNORMAL LOW (ref 6–23)
CALCIUM: 9.1 mg/dL (ref 8.4–10.5)
CALCIUM: 9 mg/dL (ref 8.4–10.5)
CO2: 24 mEq/L (ref 19–32)
CO2: 25 meq/L (ref 19–32)
CO2: 24 mEq/L (ref 19–32)
CREATININE: 0.5 mg/dL (ref 0.50–1.10)
Calcium: 8.9 mg/dL (ref 8.4–10.5)
Chloride: 99 mEq/L (ref 96–112)
Chloride: 99 mEq/L (ref 96–112)
Chloride: 97 mEq/L (ref 96–112)
Creatinine, Ser: 0.48 mg/dL — ABNORMAL LOW (ref 0.50–1.10)
Creatinine, Ser: 0.55 mg/dL (ref 0.50–1.10)
GFR calc Af Amer: >90 mL/min (ref >90)
GFR calc Af Amer: >90 mL/min (ref >90)
GFR calc Af Amer: >90 mL/min (ref >90)
GFR calc non Af Amer: >90 mL/min (ref >90)
GFR calc non Af Amer: >90 mL/min (ref >90)
GLUCOSE: 119 mg/dL — AB (ref 70–99)
Glucose, Bld: 96 mg/dL (ref 70–99)
Glucose, Bld: 120 mg/dL — ABNORMAL HIGH (ref 70–99)
Potassium: 3.5 mEq/L — ABNORMAL LOW (ref 3.7–5.3)
Potassium: 3.6 mEq/L — ABNORMAL LOW (ref 3.7–5.3)
Potassium: 3.9 mEq/L (ref 3.7–5.3)
SODIUM: 137 meq/L (ref 137–147)
Sodium: 136 mEq/L — ABNORMAL LOW (ref 137–147)
Sodium: 136 mEq/L — ABNORMAL LOW (ref 137–147)

## 2013-10-12 LAB — COMPREHENSIVE METABOLIC PANEL
ALT: 18 U/L (ref 0–35)
AST: 15 U/L (ref 0–37)
Albumin: 3.4 g/dL — ABNORMAL LOW (ref 3.5–5.2)
Alkaline Phosphatase: 63 U/L (ref 39–117)
BUN: 4 mg/dL — AB (ref 6–23)
CHLORIDE: 99 meq/L (ref 96–112)
CO2: 24 meq/L (ref 19–32)
CREATININE: 0.51 mg/dL (ref 0.50–1.10)
Calcium: 9.1 mg/dL (ref 8.4–10.5)
GFR calc Af Amer: >90 mL/min (ref >90)
Glucose, Bld: 122 mg/dL — ABNORMAL HIGH (ref 70–99)
Potassium: 3.7 mEq/L (ref 3.7–5.3)
Sodium: 138 mEq/L (ref 137–147)
Total Bilirubin: 0.2 mg/dL — ABNORMAL LOW (ref 0.3–1.2)
Total Protein: 6.3 g/dL (ref 6.0–8.3)

## 2013-10-12 LAB — TROPONIN I
Troponin I: <0.3 ng/mL (ref <0.30)
Troponin I: <0.3 ng/mL (ref <0.30)

## 2013-10-12 LAB — CBC
HCT: 40.2 % (ref 36.0–46.0)
Hemoglobin: 14.3 g/dL (ref 12.0–15.0)
MCH: 30.3 pg (ref 26.0–34.0)
MCHC: 35.6 g/dL (ref 30.0–36.0)
MCV: 85.2 fL (ref 78.0–100.0)
PLATELETS: 456 10*3/uL — AB (ref 150–400)
RBC: 4.72 MIL/uL (ref 3.87–5.11)
RDW: 13.5 % (ref 11.5–15.5)
WBC: 8.3 10*3/uL (ref 4.0–10.5)

## 2013-10-12 LAB — T4, FREE: Free T4: 1.21 ng/dL (ref 0.80–1.80)

## 2013-10-12 LAB — CK: CK TOTAL: 153 U/L (ref 7–177)

## 2013-10-12 MED ORDER — METOPROLOL TARTRATE 25 MG PO TABS
25.0000 mg | ORAL_TABLET | Freq: Two times a day (BID) | ORAL | Status: DC
  Administered 2013-10-12 – 2013-10-13 (×4): 25 mg via ORAL
  Filled 2013-10-12 (×6): qty 1

## 2013-10-12 NOTE — Progress Notes (Signed)
PROGRESS NOTE  Nancy Erickson ZOX:[id number] DOB: [birth year] DOA: 10/11/2013 PCP: No primary provider on file.  HPI/Subjective: 58 yo female admitted 3/9 for hypertensive emergency with progressive confusion over the last week. pmh of schizo affective disorder but not Htn. Supervisor informs that patient has been taking too much of her psych mediations.   Assessment/Plan:  1. Hypertensive urgency  -Patient presented with significantly elevated blood pressure with sinus tachycardia. No symptoms on admission or currently.  -unclear whether she has chronic hypertension which has not been treated. Suspected it is chronic and will begin po meds on d/c - on IV hydralazine IV Lopressor as needed.  - one dose of oral labetalol and one dose of amlodipine on admission -U. tox is negative for any substance.  -CT head is negative for any stroke.  -Started on Norvasc which might increase the reflex tachycardia, add metoprolol.  2. Hyponatremia  -supervisor reports that the patient drinks 4 L of soda on a daily basis.  -monitor BMP. 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 -Neuro checks every 4 hours   3. Altered mental status  -At present the patient appears to be at her baseline with possibly mania-hypomania.  -CT scan is normal urine analysis is normal U. tox is normal -LFTs are normal.  - psych consulted ordered   4. Schizoaffective Disorder -continue med regimen - awaiting psych consultation   DVT Prophylaxis: subcutaneous Heparin  Nutrition: Advance as tolerated    Code Status: Full  Family Communication: contacting mother today. brother was present on arrival  Disposition: Admitted to observation in telemetry unit. Awaiting psych consultation for recommendation before d/c   Objective: Filed Vitals:   10/12/13 0400 10/12/13 0632 10/12/13 0827 10/12/13 0913  BP: 167/77 169/83  154/82  Pulse: 106 103    Temp: 98.8 F (37.1 C)     TempSrc:      Resp: 20     Height:        Weight:   57.652 kg (127 lb 1.6 oz)   SpO2: 96%       Intake/Output Summary (Last 24 hours) at 10/12/13 1114 Last data filed at 10/12/13 0825  Gross per 24 hour  Intake    363 ml  Output   2750 ml  Net  -2387 ml   Filed Weights   10/11/13 2252 10/12/13 0827  Weight: 58.605 kg (129 lb 3.2 oz) 57.652 kg (127 lb 1.6 oz)    Exam: General: Well developed, well nourished, NAD, appears stated age  HEENT:  PERR. No pharyngeal erythema or exudates  Neck: Supple, no JVD, no masses  Cardiovascular: RRR, S1 S2 auscultated, no rubs, murmurs or gallops.   Respiratory: Clear to auscultation bilaterally with equal chest rise  Abdomen: Soft, nontender, nondistended, + bowel sounds  Extremities: warm dry without cyanosis clubbing or edema.  Neuro: AAOx3 Skin: Without rashes exudates or nodules.   Psych: slight confusion when answering questions and mild euphoria. May be baseline  Data Reviewed: Basic Metabolic Panel:  Recent Labs Lab 10/11/13 1417 10/11/13 1440 10/11/13 2133 10/12/13 0103 10/12/13 0512 10/12/13 0920  NA 128* 129* 138 137 136*  138 136*  K 3.8 3.6* 3.8 3.5* 3.6*  3.7 3.9  CL 89* 91* 99 99 99  99 97  CO2 24  --  28 24 25  24 24   GLUCOSE 114* 116* 109* 96 120*  122* 119*  BUN <3* <3* <3* 3* 4*  4* 5*  CREATININE 0.48* 0.50 0.55 0.48* 0.50  0.51 0.55  CALCIUM 9.4  --  9.3 8.9 9.1  9.1 9.0   Liver Function Tests:  Recent Labs Lab 10/11/13 1417 10/12/13 0512  AST 21 15  ALT 21 18  ALKPHOS 68 63  BILITOT 0.4 0.2*  PROT 6.9 6.3  ALBUMIN 4.1 3.4*  CBC:  Recent Labs Lab 10/11/13 1417 10/11/13 1440 10/12/13 0512  WBC 8.3  --  8.3  NEUTROABS 5.6  --   --   HGB 14.2 15.0 14.3  HCT 39.0 44.0 40.2  MCV 84.4  --  85.2  PLT 427*  --  456*   Cardiac Enzymes:  Recent Labs Lab 10/12/13 0020 10/12/13 0512  CKTOTAL 153  --   TROPONINI <0.30 <0.30    Studies: Ct Head Wo Contrast  10/11/2013   CLINICAL DATA:  Hypertension and confusion. History of  paranoid schizophrenia.  EXAM: CT HEAD WITHOUT CONTRAST  TECHNIQUE: Contiguous axial images were obtained from the base of the skull through the vertex without intravenous contrast.  COMPARISON:  No priors.  FINDINGS: In the right temporal lobe (images 9-12 of series 2) there are areas of low attenuation, with apparent loss of gray-white differentiation. This region is somewhat affected by beam hardening artifact from skullbase, however, the possibility of edema from subacute ischemia in this region is difficult to entirely exclude. The remainder the brain is otherwise normal in appearance. Specifically, no signs of acute intracranial hemorrhage. No definite mass or mass effect. No hydrocephalus. Visualized paranasal sinuses and mastoids are well pneumatized. No acute displaced skull fractures are identified.  IMPRESSION: 1. Findings that could potentially be seen in the setting of subacute ischemia in the right MCA distribution, specifically in the right temporal lobe, as above. Although this may be artifactual, clinical correlation is recommended, and if there is any clinical concern for acute ischemia in this region, further evaluation with brain MRI would be recommended. These results were called by telephone at the time of interpretation on 10/11/2013 at 4:14 PM to Dr. Glynn Octave, who verbally acknowledged these results.   Electronically Signed   By: Trudie Reed M.D.   On: 10/11/2013 16:17   Mr Brain Wo Contrast  10/11/2013   CLINICAL DATA:  Altered mental status.  Schizophrenia.  Abnormal CT  EXAM: MRI HEAD WITHOUT CONTRAST  TECHNIQUE: Multiplanar, multiecho pulse sequences of the brain and surrounding structures were obtained without intravenous contrast.  COMPARISON:  CT head from today  FINDINGS: Incomplete study. The patient was unable to tolerate scanning. Sagittal T1, axial and coronal diffusion-weighted imaging was performed. No T2 weighted imaging. There is motion on the images  Negative for  acute infarct. Ventricle size is normal. Right temporal lobe appears normal on diffusion-weighted imaging. Obviously a more complete examination would be helpful however I feel based on the study that the right temporal lobe hypodensity on CT is most likely due to artifact. If symptoms persist, repeat CT or MRI could be performed.  IMPRESSION: No acute abnormality.  No areas of acute infarct identified.  Incomplete study   Electronically Signed   By: Marlan Erickson M.D.   On: 10/11/2013 18:24    Scheduled Meds: . amLODipine  5 mg Oral Daily  . benztropine  1 mg Oral Daily  . heparin  5,000 Units Subcutaneous 3 times per day  . risperiDONE  6 mg Oral QHS  . sodium chloride  3 mL Intravenous Q12H    Elease Hashimoto, PA-S  [facility] Pager [phone]. If 7PM-7AM, please contact night-coverage at [email or web address], password  [email or web address] 10/12/2013, 11:14 AM  LOS: 1 day    Addendum  Patient seen and examined, chart and data base reviewed.  I agree with the above assessment and plan.  For full details please see Mrs. Elease HashimotoJacqueline Kocot, PA-S note.  I reviewed and addended the above note as needed   Clint LippsMutaz A Uno Esau, MD [facility] Pager: [phone] 10/12/2013, 1:33 PM

## 2013-10-12 NOTE — Consult Note (Signed)
[name] Face-to-Face Psychiatry Consult   Reason for Consult:  Schizophrenia Referring Physician:  Dr. Earvin Hansen is an 58 y.o. female. Total Time spent with patient: 45 minutes  Assessment: AXIS I:  Schizoaffective Disorder AXIS II:  Deferred AXIS III:   Past Medical History  Diagnosis Date  . Paranoid schizophrenia    AXIS IV:  economic problems, occupational problems, other psychosocial or environmental problems, problems related to social environment and problems with primary support group AXIS V:  51-60 moderate symptoms  Plan: Continue home medication Risperidone and cogentin No evidence of imminent risk to self or others at present.   Patient does not meet criteria for psychiatric inpatient admission. Supportive therapy provided about ongoing stressors.  Subjective:   Nancy Erickson is a 58 y.o. female patient admitted with altered mental status.  HPI:  Nancy Erickson is a 58 y.o. female admitted to [facility] with confusion and disorientation, bizarre behavirors and questionable non compliant with her medications. She has been diagnosed with paranoid schizophrenia and has been taking medication from Wandra Scot from [facility]. Patient was seen sitting in nursing station and wearing her top/shirt on top of hospital gown. She is calm, quiet and cooperative. She has denied symptoms of depression, anxiety and psychosis. She has denied suicidal or homicidal ideations, intention or plans. She was seen in the office about four months ago and needs to be go back for appropriate medication changes if needed. The patient is coming from supervised group living facility. She has a prescription that should continue until 20th of March, but she is on her last medications today. Patient has significantly elevated blood pressure and then brought here.   Review of Systems: as mentioned in the history of present illness.  A Comprehensive review  of the other systems is negative.  Past Psychiatric History: Past Medical History  Diagnosis Date  . Paranoid schizophrenia     reports that she has been smoking Cigarettes.  She has been smoking about 0.00 packs per day. She does not have any smokeless tobacco history on file. She reports that she does not drink alcohol or use illicit drugs. No family history on file.         Allergies:   Allergies  Allergen Reactions  . Codeine Other (See Comments)    unsure  . Phosphate Nausea And Vomiting    ACT Assessment Complete: Objective: Blood pressure 183/97, pulse 111, temperature 98.4 F (36.9 C), temperature source Oral, resp. rate 18, height 5' 6"  (1.676 m), weight 57.652 kg (127 lb 1.6 oz), SpO2 95.00%.Body mass index is 20.52 kg/(m^2). Results for orders placed during the hospital encounter of 10/11/13 (from the past 72 hour(s))  CBC WITH DIFFERENTIAL     Status: Abnormal   Collection Time    10/11/13  2:17 PM      Result Value Ref Range   WBC 8.3  4.0 - 10.5 K/uL   RBC 4.62  3.87 - 5.11 MIL/uL   Hemoglobin 14.2  12.0 - 15.0 g/dL   HCT 39.0  36.0 - 46.0 %   MCV 84.4  78.0 - 100.0 fL   MCH 30.7  26.0 - 34.0 pg   MCHC 36.4 (*) 30.0 - 36.0 g/dL   RDW 13.4  11.5 - 15.5 %   Platelets 427 (*) 150 - 400 K/uL   Neutrophils Relative % 67  43 - 77 %   Neutro Abs 5.6  1.7 - 7.7 K/uL   Lymphocytes Relative 24  12 - 46 %   Lymphs Abs 2.0  0.7 - 4.0 K/uL   Monocytes Relative 8  3 - 12 %   Monocytes Absolute 0.6  0.1 - 1.0 K/uL   Eosinophils Relative 1  0 - 5 %   Eosinophils Absolute 0.1  0.0 - 0.7 K/uL   Basophils Relative 0  0 - 1 %   Basophils Absolute 0.0  0.0 - 0.1 K/uL  COMPREHENSIVE METABOLIC PANEL     Status: Abnormal   Collection Time    10/11/13  2:17 PM      Result Value Ref Range   Sodium 128 (*) 137 - 147 mEq/L   Potassium 3.8  3.7 - 5.3 mEq/L   Chloride 89 (*) 96 - 112 mEq/L   CO2 24  19 - 32 mEq/L   Glucose, Bld 114 (*) 70 - 99 mg/dL   BUN <3 (*) 6 - 23 mg/dL    Creatinine, Ser 0.48 (*) 0.50 - 1.10 mg/dL   Calcium 9.4  8.4 - 10.5 mg/dL   Total Protein 6.9  6.0 - 8.3 g/dL   Albumin 4.1  3.5 - 5.2 g/dL   AST 21  0 - 37 U/L   ALT 21  0 - 35 U/L   Alkaline Phosphatase 68  39 - 117 U/L   Total Bilirubin 0.4  0.3 - 1.2 mg/dL   GFR calc non Af Amer >90  >90 mL/min   GFR calc Af Amer >90  >90 mL/min   Comment: (NOTE)     The eGFR has been calculated using the CKD EPI equation.     This calculation has not been validated in all clinical situations.     eGFR's persistently <90 mL/min signify possible Chronic Kidney     Disease.  I-STAT CHEM 8, ED     Status: Abnormal   Collection Time    10/11/13  2:40 PM      Result Value Ref Range   Sodium 129 (*) 137 - 147 mEq/L   Potassium 3.6 (*) 3.7 - 5.3 mEq/L   Chloride 91 (*) 96 - 112 mEq/L   BUN <3 (*) 6 - 23 mg/dL   Creatinine, Ser 0.50  0.50 - 1.10 mg/dL   Glucose, Bld 116 (*) 70 - 99 mg/dL   Calcium, Ion 1.13  1.12 - 1.23 mmol/L   TCO2 26  0 - 100 mmol/L   Hemoglobin 15.0  12.0 - 15.0 g/dL   HCT 44.0  36.0 - 46.0 %  ETHANOL     Status: None   Collection Time    10/11/13  3:44 PM      Result Value Ref Range   Alcohol, Ethyl (B) <11  0 - 11 mg/dL   Comment:            LOWEST DETECTABLE LIMIT FOR     SERUM ALCOHOL IS 11 mg/dL     FOR MEDICAL PURPOSES ONLY  URINE RAPID DRUG SCREEN (HOSP PERFORMED)     Status: None   Collection Time    10/11/13  4:58 PM      Result Value Ref Range   Opiates NONE DETECTED  NONE DETECTED   Cocaine NONE DETECTED  NONE DETECTED   Benzodiazepines NONE DETECTED  NONE DETECTED   Amphetamines NONE DETECTED  NONE DETECTED   Tetrahydrocannabinol NONE DETECTED  NONE DETECTED   Barbiturates NONE DETECTED  NONE DETECTED   Comment:            DRUG  SCREEN FOR MEDICAL PURPOSES     ONLY.  IF CONFIRMATION IS NEEDED     FOR ANY PURPOSE, NOTIFY LAB     WITHIN 5 DAYS.                LOWEST DETECTABLE LIMITS     FOR URINE DRUG SCREEN     Drug Class       Cutoff (ng/mL)      Amphetamine      1000     Barbiturate      200     Benzodiazepine   412     Tricyclics       878     Opiates          300     Cocaine          300     THC              50  URINALYSIS, ROUTINE W REFLEX MICROSCOPIC     Status: None   Collection Time    10/11/13  4:58 PM      Result Value Ref Range   Color, Urine YELLOW  YELLOW   APPearance CLEAR  CLEAR   Specific Gravity, Urine 1.005  1.005 - 1.030   pH 7.5  5.0 - 8.0   Glucose, UA NEGATIVE  NEGATIVE mg/dL   Hgb urine dipstick NEGATIVE  NEGATIVE   Bilirubin Urine NEGATIVE  NEGATIVE   Ketones, ur NEGATIVE  NEGATIVE mg/dL   Protein, ur NEGATIVE  NEGATIVE mg/dL   Urobilinogen, UA 0.2  0.0 - 1.0 mg/dL   Nitrite NEGATIVE  NEGATIVE   Leukocytes, UA NEGATIVE  NEGATIVE   Comment: MICROSCOPIC NOT DONE ON URINES WITH NEGATIVE PROTEIN, BLOOD, LEUKOCYTES, NITRITE, OR GLUCOSE <1000 mg/dL.  POC URINE PREG, ED     Status: None   Collection Time    10/11/13  5:19 PM      Result Value Ref Range   Preg Test, Ur NEGATIVE  NEGATIVE   Comment:            THE SENSITIVITY OF THIS     METHODOLOGY IS >24 mIU/mL  BASIC METABOLIC PANEL     Status: Abnormal   Collection Time    10/11/13  9:33 PM      Result Value Ref Range   Sodium 138  137 - 147 mEq/L   Comment: DELTA CHECK NOTED   Potassium 3.8  3.7 - 5.3 mEq/L   Chloride 99  96 - 112 mEq/L   Comment: DELTA CHECK NOTED   CO2 28  19 - 32 mEq/L   Glucose, Bld 109 (*) 70 - 99 mg/dL   BUN <3 (*) 6 - 23 mg/dL   Creatinine, Ser 0.55  0.50 - 1.10 mg/dL   Calcium 9.3  8.4 - 10.5 mg/dL   GFR calc non Af Amer >90  >90 mL/min   GFR calc Af Amer >90  >90 mL/min   Comment: (NOTE)     The eGFR has been calculated using the CKD EPI equation.     This calculation has not been validated in all clinical situations.     eGFR's persistently <90 mL/min signify possible Chronic Kidney     Disease.  CK     Status: None   Collection Time    10/12/13 12:20 AM      Result Value Ref Range   Total CK 153  7 - 177  U/L  TSH  Status: None   Collection Time    10/12/13 12:20 AM      Result Value Ref Range   TSH 1.584  0.350 - 4.500 uIU/mL   Comment: Performed at [facility]  T4, FREE     Status: None   Collection Time    10/12/13 12:20 AM      Result Value Ref Range   Free T4 1.21  0.80 - 1.80 ng/dL   Comment: Performed at [facility]  TROPONIN I     Status: None   Collection Time    10/12/13 12:20 AM      Result Value Ref Range   Troponin I <0.30  <0.30 ng/mL   Comment:            Due to the release kinetics of cTnI,     a negative result within the first hours     of the onset of symptoms does not rule out     myocardial infarction with certainty.     If myocardial infarction is still suspected,     repeat the test at appropriate intervals.  BASIC METABOLIC PANEL     Status: Abnormal   Collection Time    10/12/13  1:03 AM      Result Value Ref Range   Sodium 137  137 - 147 mEq/L   Potassium 3.5 (*) 3.7 - 5.3 mEq/L   Chloride 99  96 - 112 mEq/L   CO2 24  19 - 32 mEq/L   Glucose, Bld 96  70 - 99 mg/dL   BUN 3 (*) 6 - 23 mg/dL   Creatinine, Ser 0.48 (*) 0.50 - 1.10 mg/dL   Calcium 8.9  8.4 - 10.5 mg/dL   GFR calc non Af Amer >90  >90 mL/min   GFR calc Af Amer >90  >90 mL/min   Comment: (NOTE)     The eGFR has been calculated using the CKD EPI equation.     This calculation has not been validated in all clinical situations.     eGFR's persistently <90 mL/min signify possible Chronic Kidney     Disease.  BASIC METABOLIC PANEL     Status: Abnormal   Collection Time    10/12/13  5:12 AM      Result Value Ref Range   Sodium 136 (*) 137 - 147 mEq/L   Potassium 3.6 (*) 3.7 - 5.3 mEq/L   Chloride 99  96 - 112 mEq/L   CO2 25  19 - 32 mEq/L   Glucose, Bld 120 (*) 70 - 99 mg/dL   BUN 4 (*) 6 - 23 mg/dL   Creatinine, Ser 0.50  0.50 - 1.10 mg/dL   Calcium 9.1  8.4 - 10.5 mg/dL   GFR calc non Af Amer >90  >90 mL/min   GFR calc Af Amer >90  >90 mL/min   Comment:  (NOTE)     The eGFR has been calculated using the CKD EPI equation.     This calculation has not been validated in all clinical situations.     eGFR's persistently <90 mL/min signify possible Chronic Kidney     Disease.  TROPONIN I     Status: None   Collection Time    10/12/13  5:12 AM      Result Value Ref Range   Troponin I <0.30  <0.30 ng/mL   Comment:            Due to the release kinetics of cTnI,  a negative result within the first hours     of the onset of symptoms does not rule out     myocardial infarction with certainty.     If myocardial infarction is still suspected,     repeat the test at appropriate intervals.  COMPREHENSIVE METABOLIC PANEL     Status: Abnormal   Collection Time    10/12/13  5:12 AM      Result Value Ref Range   Sodium 138  137 - 147 mEq/L   Potassium 3.7  3.7 - 5.3 mEq/L   Chloride 99  96 - 112 mEq/L   CO2 24  19 - 32 mEq/L   Glucose, Bld 122 (*) 70 - 99 mg/dL   BUN 4 (*) 6 - 23 mg/dL   Creatinine, Ser 0.51  0.50 - 1.10 mg/dL   Calcium 9.1  8.4 - 10.5 mg/dL   Total Protein 6.3  6.0 - 8.3 g/dL   Albumin 3.4 (*) 3.5 - 5.2 g/dL   AST 15  0 - 37 U/L   ALT 18  0 - 35 U/L   Alkaline Phosphatase 63  39 - 117 U/L   Total Bilirubin 0.2 (*) 0.3 - 1.2 mg/dL   GFR calc non Af Amer >90  >90 mL/min   GFR calc Af Amer >90  >90 mL/min   Comment: (NOTE)     The eGFR has been calculated using the CKD EPI equation.     This calculation has not been validated in all clinical situations.     eGFR's persistently <90 mL/min signify possible Chronic Kidney     Disease.  CBC     Status: Abnormal   Collection Time    10/12/13  5:12 AM      Result Value Ref Range   WBC 8.3  4.0 - 10.5 K/uL   RBC 4.72  3.87 - 5.11 MIL/uL   Hemoglobin 14.3  12.0 - 15.0 g/dL   HCT 40.2  36.0 - 46.0 %   MCV 85.2  78.0 - 100.0 fL   MCH 30.3  26.0 - 34.0 pg   MCHC 35.6  30.0 - 36.0 g/dL   RDW 13.5  11.5 - 15.5 %   Platelets 456 (*) 150 - 400 K/uL  BASIC METABOLIC PANEL      Status: Abnormal   Collection Time    10/12/13  9:20 AM      Result Value Ref Range   Sodium 136 (*) 137 - 147 mEq/L   Potassium 3.9  3.7 - 5.3 mEq/L   Chloride 97  96 - 112 mEq/L   CO2 24  19 - 32 mEq/L   Glucose, Bld 119 (*) 70 - 99 mg/dL   BUN 5 (*) 6 - 23 mg/dL   Creatinine, Ser 0.55  0.50 - 1.10 mg/dL   Calcium 9.0  8.4 - 10.5 mg/dL   GFR calc non Af Amer >90  >90 mL/min   GFR calc Af Amer >90  >90 mL/min   Comment: (NOTE)     The eGFR has been calculated using the CKD EPI equation.     This calculation has not been validated in all clinical situations.     eGFR's persistently <90 mL/min signify possible Chronic Kidney     Disease.  TROPONIN I     Status: None   Collection Time    10/12/13 10:45 AM      Result Value Ref Range   Troponin I <0.30  <0.30 ng/mL  Comment:            Due to the release kinetics of cTnI,     a negative result within the first hours     of the onset of symptoms does not rule out     myocardial infarction with certainty.     If myocardial infarction is still suspected,     repeat the test at appropriate intervals.   Labs are reviewed and are pertinent for electrolyte abnl.  Current Facility-Administered Medications  Medication Dose Route Frequency Provider Last Rate Last Dose  . acetaminophen (TYLENOL) tablet 650 mg  650 mg Oral Q6H PRN Berle Mull, MD       Or  . acetaminophen (TYLENOL) suppository 650 mg  650 mg Rectal Q6H PRN Berle Mull, MD      . amLODipine (NORVASC) tablet 5 mg  5 mg Oral Daily Berle Mull, MD   5 mg at 10/12/13 1038  . benztropine (COGENTIN) tablet 1 mg  1 mg Oral Daily Berle Mull, MD   1 mg at 10/12/13 1038  . heparin injection 5,000 Units  5,000 Units Subcutaneous 3 times per day Berle Mull, MD   5,000 Units at 10/12/13 [phone]  . hydrALAZINE (APRESOLINE) injection 10 mg  10 mg Intravenous Q4H PRN Berle Mull, MD      . metoprolol (LOPRESSOR) injection 5 mg  5 mg Intravenous Q6H PRN Berle Mull, MD      .  metoprolol tartrate (LOPRESSOR) tablet 25 mg  25 mg Oral BID Verlee Monte, MD      . ondansetron (ZOFRAN) tablet 4 mg  4 mg Oral Q6H PRN Berle Mull, MD       Or  . ondansetron (ZOFRAN) injection 4 mg  4 mg Intravenous Q6H PRN Berle Mull, MD      . risperiDONE (RISPERDAL) tablet 6 mg  6 mg Oral QHS Berle Mull, MD      . sodium chloride 0.9 % injection 3 mL  3 mL Intravenous Q12H Berle Mull, MD   3 mL at 10/12/13 1038    Psychiatric Specialty Exam: Physical Exam  ROS  Blood pressure 183/97, pulse 111, temperature 98.4 F (36.9 C), temperature source Oral, resp. rate 18, height 5' 6"  (1.676 m), weight 57.652 kg (127 lb 1.6 oz), SpO2 95.00%.Body mass index is 20.52 kg/(m^2).  General Appearance: Casual  Eye Contact::  Good  Speech:  Clear and Coherent and speech expression deficilties, dysarthria.  Volume:  Normal  Mood:  Anxious  Affect:  Appropriate and Congruent  Thought Process:  Goal Directed and Intact  Orientation:  Full (Time, Place, and Person)  Thought Content:  WDL  Suicidal Thoughts:  No  Homicidal Thoughts:  No  Memory:  Immediate;   Fair  Judgement:  Intact  Insight:  Fair  Psychomotor Activity:  Normal  Concentration:  Fair  Recall:  [employer] of Knowledge:Good  Language: Fair  Akathisia:  NA  Handed:  Right  AIMS (if indicated):     Assets:  Communication Skills Desire for Improvement Housing Physical Health Resilience Social Support  Sleep:      Musculoskeletal: Strength & Muscle Tone: within normal limits Gait & Station: normal Patient leans: N/A  Treatment Plan Summary: Daily contact with patient to assess and evaluate symptoms and progress in treatment Medication management  Kavari Parrillo,JANARDHAHA R. 10/12/2013 2:03 PM

## 2013-10-12 NOTE — Progress Notes (Signed)
  Echocardiogram 2D Echocardiogram has been performed.  Georgian CoWILLIAMS, Malasia Torain 10/12/2013, 4:13 PM

## 2013-10-13 DIAGNOSIS — F259 Schizoaffective disorder, unspecified: Secondary | ICD-10-CM

## 2013-10-13 LAB — BASIC METABOLIC PANEL
BUN: 6 mg/dL (ref 6–23)
CALCIUM: 9 mg/dL (ref 8.4–10.5)
CO2: 25 meq/L (ref 19–32)
CREATININE: 0.61 mg/dL (ref 0.50–1.10)
Chloride: 94 mEq/L — ABNORMAL LOW (ref 96–112)
GFR calc Af Amer: >90 mL/min (ref >90)
GFR calc non Af Amer: >90 mL/min (ref >90)
GLUCOSE: 97 mg/dL (ref 70–99)
Potassium: 3.8 mEq/L (ref 3.7–5.3)
Sodium: 132 mEq/L — ABNORMAL LOW (ref 137–147)

## 2013-10-13 MED ORDER — AMLODIPINE BESYLATE 5 MG PO TABS
5.0000 mg | ORAL_TABLET | Freq: Once | ORAL | Status: AC
  Administered 2013-10-13: 5 mg via ORAL
  Filled 2013-10-13: qty 1

## 2013-10-13 MED ORDER — PNEUMOCOCCAL VAC POLYVALENT 25 MCG/0.5ML IJ INJ
0.5000 mL | INJECTION | INTRAMUSCULAR | Status: DC
  Filled 2013-10-13: qty 0.5

## 2013-10-13 MED ORDER — INFLUENZA VAC SPLIT QUAD 0.5 ML IM SUSP
0.5000 mL | INTRAMUSCULAR | Status: AC
  Administered 2013-10-14: 0.5 mL via INTRAMUSCULAR
  Filled 2013-10-13: qty 0.5

## 2013-10-13 MED ORDER — AMLODIPINE BESYLATE 10 MG PO TABS
10.0000 mg | ORAL_TABLET | Freq: Every day | ORAL | Status: DC
Start: 2013-10-14 — End: 2013-10-14
  Administered 2013-10-14: 10 mg via ORAL
  Filled 2013-10-13: qty 1

## 2013-10-13 NOTE — Clinical Documentation Improvement (Signed)
  Admitting diagnosis is "Altered mental status". In the Coding world this term is considered nonspecific and low weighted. If possible, please clarify possible/suspected/likely underlying cause of "altered mental status" to better illustrate severity of illness and risk of mortality. Thank you.  Possible Clinical Conditions?  Encephalopathy (describe type if known)                       Hypertensive                       Metabolic                       Toxic Schizophrenia Hyponatremia / Hypernatremia Other Condition  Supporting Information: - Hx paranoid schizophrenia - Hypertensive on admit:  3/9 190/94,  177/95,  193/91 - Signs & Symptoms: confusion, agitation  Thank You, Beverley FiedlerLaurie E Lamaya Hyneman ,RN Clinical Documentation Specialist:  [phone]  [facility]- Health Information Management

## 2013-10-13 NOTE — Progress Notes (Addendum)
PROGRESS NOTE  Nancy Erickson ZOX:[id number] DOB: 10/25/1955 DOA: 10/11/2013 PCP: No primary provider on file.  HPI/Subjective: 58 yo female with history of paranoid schizophrenia, no prior history of hypertension, resident of supervised group living facility, presented to the ED on 10/11/13 with altered mental status (not acting herself, more confused, being out in the cold without clothing, knocking her neighbors doors repeatedly). She was taken to an urgent care where she was found to have significantly elevated blood pressures and was admitted for hypertensive emergency. Supervisor informs that patient has been taking too much of her psych mediations.   Assessment/Plan:  1. Hypertensive urgency  - Patient presented with significantly elevated blood pressure with sinus tachycardia. No symptoms on admission or currently.  - As per nurses discussion with family, no known history of hypertension. Family history positive for heart disease but not hypertension. - U. tox is negative for any substance.  - MRI brain negative for any stroke.  - Started on Norvasc-adjust dose, added metoprolol-continue. - She could still have previously undiagnosed chronic hypotension. Due to the degree of hypertension, consider secondary causes. Will check TSH and renal artery Dopplers. - 2-D echo shows mild LVH.  2. Hyponatremia  -supervisor reports that the patient drinks 4 L of soda on a daily basis.  -monitor BMP. 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 - Low but stable.   3. Altered mental status  -CT scan is normal urine analysis is normal U. tox is normal -LFTs are normal.  - Psychiatric consultation appreciated. Recommend continuing home medications including risperidone and Cogentin for her schizoaffective disorder   4. Schizoaffective Disorder -continue med regimen as above  -  psychiatry input appreciated.   DVT Prophylaxis: subcutaneous Heparin  Nutrition: Advance as tolerated    Code  Status: Full  Family Communication: Discussed with patient's father. Disposition:  return to prior level of living when medically stable.    Objective: Filed Vitals:   10/12/13 2000 10/13/13 0000 10/13/13 0400 10/13/13 0800  BP: 181/91 182/90 185/91 187/98  Pulse: 94  84 85  Temp: 98.9 F (37.2 C)  97.9 F (36.6 C) 98.2 F (36.8 C)  TempSrc: Oral  Oral Oral  Resp:   18 18  Height:      Weight:      SpO2: 96%  99% 98%    Intake/Output Summary (Last 24 hours) at 10/13/13 1056 Last data filed at 10/13/13 1019  Gross per 24 hour  Intake      6 ml  Output      0 ml  Net      6 ml   Filed Weights   10/11/13 2252 10/12/13 0827  Weight: 58.605 kg (129 lb 3.2 oz) 57.652 kg (127 lb 1.6 oz)    Exam: General:  middle-aged female sitting comfortably in bed.  Cardiovascular: RRR, S1 S2 auscultated, no rubs, murmurs or gallops.   telemetry: Sinus rhythm/sinus tachycardia Respiratory: Clear to auscultation bilaterally. No increased work of breathing  Abdomen: Soft, nontender, nondistended, + bowel sounds  Neuro: AAOx3. No focal deficits Psych:  keeps repeating same thing at times. Pleasant   Data Reviewed: Basic Metabolic Panel:  Recent Labs Lab 10/11/13 2133 10/12/13 0103 10/12/13 0512 10/12/13 0920 10/13/13 0350  NA 138 137 136*  138 136* 132*  K 3.8 3.5* 3.6*  3.7 3.9 3.8  CL 99 99 99  99 97 94*  CO2 28 24 25  24 24 25   GLUCOSE 109* 96 120*  122* 119* 97  BUN <  3* 3* 4*  4* 5* 6  CREATININE 0.55 0.48* 0.50  0.51 0.55 0.61  CALCIUM 9.3 8.9 9.1  9.1 9.0 9.0   Liver Function Tests:  Recent Labs Lab 10/11/13 1417 10/12/13 0512  AST 21 15  ALT 21 18  ALKPHOS 68 63  BILITOT 0.4 0.2*  PROT 6.9 6.3  ALBUMIN 4.1 3.4*  CBC:  Recent Labs Lab 10/11/13 1417 10/11/13 1440 10/12/13 0512  WBC 8.3  --  8.3  NEUTROABS 5.6  --   --   HGB 14.2 15.0 14.3  HCT 39.0 44.0 40.2  MCV 84.4  --  85.2  PLT 427*  --  456*   Cardiac Enzymes:  Recent Labs Lab  10/12/13 0020 10/12/13 0512 10/12/13 1045  CKTOTAL 153  --   --   TROPONINI <0.30 <0.30 <0.30   Additional tests  Urine pregnancy test: Negative  TSH: 1.584, free T4: 1.21  Urine microscopy: Unremarkable  UDS: Negative  Blood alcohol level <11  2-D echo 10/12/13: Study Conclusions  - Left ventricle: The cavity size was normal. Wall thickness was increased in a pattern of mild LVH. Systolic function was normal. The estimated ejection fraction was in the range of 60% to 65%. Wall motion was normal; there were no regional wall motion abnormalities. Doppler parameters are consistent with abnormal left ventricular relaxation (grade 1 diastolic dysfunction). - Atrial septum: No defect or patent foramen ovale was identified.   Studies: Ct Head Wo Contrast  10/11/2013   CLINICAL DATA:  Hypertension and confusion. History of paranoid schizophrenia.  EXAM: CT HEAD WITHOUT CONTRAST  TECHNIQUE: Contiguous axial images were obtained from the base of the skull through the vertex without intravenous contrast.  COMPARISON:  No priors.  FINDINGS: In the right temporal lobe (images 9-12 of series 2) there are areas of low attenuation, with apparent loss of gray-white differentiation. This region is somewhat affected by beam hardening artifact from skullbase, however, the possibility of edema from subacute ischemia in this region is difficult to entirely exclude. The remainder the brain is otherwise normal in appearance. Specifically, no signs of acute intracranial hemorrhage. No definite mass or mass effect. No hydrocephalus. Visualized paranasal sinuses and mastoids are well pneumatized. No acute displaced skull fractures are identified.  IMPRESSION: 1. Findings that could potentially be seen in the setting of subacute ischemia in the right MCA distribution, specifically in the right temporal lobe, as above. Although this may be artifactual, clinical correlation is recommended, and if there is any  clinical concern for acute ischemia in this region, further evaluation with brain MRI would be recommended. These results were called by telephone at the time of interpretation on 10/11/2013 at 4:14 PM to Dr. Glynn OctaveSTEPHEN RANCOUR, who verbally acknowledged these results.   Electronically Signed   By: Trudie Reedaniel  Entrikin M.D.   On: 10/11/2013 16:17   Mr Brain Wo Contrast  10/11/2013   CLINICAL DATA:  Altered mental status.  Schizophrenia.  Abnormal CT  EXAM: MRI HEAD WITHOUT CONTRAST  TECHNIQUE: Multiplanar, multiecho pulse sequences of the brain and surrounding structures were obtained without intravenous contrast.  COMPARISON:  CT head from today  FINDINGS: Incomplete study. The patient was unable to tolerate scanning. Sagittal T1, axial and coronal diffusion-weighted imaging was performed. No T2 weighted imaging. There is motion on the images  Negative for acute infarct. Ventricle size is normal. Right temporal lobe appears normal on diffusion-weighted imaging. Obviously a more complete examination would be helpful however I feel based on the study  that the right temporal lobe hypodensity on CT is most likely due to artifact. If symptoms persist, repeat CT or MRI could be performed.  IMPRESSION: No acute abnormality.  No areas of acute infarct identified.  Incomplete study   Electronically Signed   By: Marlan Palau M.D.   On: 10/11/2013 18:24    Scheduled Meds: . amLODipine  5 mg Oral Daily  . benztropine  1 mg Oral Daily  . heparin  5,000 Units Subcutaneous 3 times per day  . metoprolol tartrate  25 mg Oral BID  . risperiDONE  6 mg Oral QHS  . sodium chloride  3 mL Intravenous Q12H    Bearett Porcaro, MD, FACP, FHM. [facility] Pager [phone]  If 7PM-7AM, please contact night-coverage [email or web address] Password [facility] 10/13/2013, 4:42 PM  Time: 40 minutes.

## 2013-10-14 DIAGNOSIS — I10 Essential (primary) hypertension: Secondary | ICD-10-CM

## 2013-10-14 LAB — BASIC METABOLIC PANEL
BUN: 8 mg/dL (ref 6–23)
CALCIUM: 8.6 mg/dL (ref 8.4–10.5)
CO2: 26 mEq/L (ref 19–32)
CREATININE: 0.61 mg/dL (ref 0.50–1.10)
Chloride: 97 mEq/L (ref 96–112)
GLUCOSE: 95 mg/dL (ref 70–99)
Potassium: 3.8 mEq/L (ref 3.7–5.3)
Sodium: 136 mEq/L — ABNORMAL LOW (ref 137–147)

## 2013-10-14 LAB — MYOGLOBIN, URINE: Myoglobin, Ur: <27 mcg/L (ref <28)

## 2013-10-14 MED ORDER — METOPROLOL TARTRATE 50 MG PO TABS: 50.0000 mg | ORAL_TABLET | Freq: Two times a day (BID) | ORAL | Status: DC

## 2013-10-14 MED ORDER — RISPERIDONE 3 MG PO TABS: 6.0000 mg | ORAL_TABLET | Freq: Every day | ORAL | Status: DC

## 2013-10-14 MED ORDER — METOPROLOL TARTRATE 50 MG PO TABS
50.0000 mg | ORAL_TABLET | Freq: Two times a day (BID) | ORAL | Status: DC
  Administered 2013-10-14: 50 mg via ORAL
  Filled 2013-10-14 (×2): qty 1

## 2013-10-14 MED ORDER — AMLODIPINE BESYLATE 10 MG PO TABS: 10.0000 mg | ORAL_TABLET | Freq: Every day | ORAL | Status: DC

## 2013-10-14 MED ORDER — BENZTROPINE MESYLATE 1 MG PO TABS: 1.0000 mg | ORAL_TABLET | Freq: Every day | ORAL | Status: DC

## 2013-10-14 MED ORDER — METOPROLOL TARTRATE 50 MG PO TABS
50.0000 mg | ORAL_TABLET | Freq: Two times a day (BID) | ORAL | Status: DC
  Filled 2013-10-14: qty 1

## 2013-10-14 NOTE — Discharge Summary (Signed)
Physician Discharge Summary  Nancy LandmarkMarilyn Erickson MVH:[id number] DOB: August 29, 1955 DOA: 10/11/2013  PCP: No primary provider on file.  Admit date: 10/11/2013 Discharge date: 10/14/2013  Time spent: 35 minutes  Recommendations for Outpatient Follow-up:  1. Needs lipid panel, regular preventive care in about 3 months 2. Consider chem 12, CBC in about a week 3. Close followup with outpatient psychiatrist 4. Continue medications such as metoprolol and amlodipine that have been started his hospital stay  Discharge Diagnoses:  Principal Problem:   Hypertensive urgency Active Problems:   Schizo-affective schizophrenia   Hyponatremia   Altered mental state   Discharge Condition: Stable  Diet recommendation: Heart healthy  Filed Weights   10/11/13 2252 10/12/13 0827 10/14/13 0400  Weight: 58.605 kg (129 lb 3.2 oz) 57.652 kg (127 lb 1.6 oz) 57.153 kg (126 lb)    History of present illness:  58 y.o. female with Past medical history of paranoid schizophrenia. Supervisor that facility with hypertensive urgency, confusion, more schizophrenic than usual. No voices heard, no seizure activity, Blood pressure found to be in the 200s and patient is him at his hypertensive crisis Psychiatrist consulted as below and recommended supportive management   Hospital Course:   1. Hypertensive urgency  - Patient presented with significantly elevated blood pressure with sinus tachycardia. No symptoms on admission or currently.  - As per nurses discussion with family, no known history of hypertension. Family history positive for heart disease but not hypertension.  - U. tox is negative for any substance.  - MRI brain negative for any stroke.  - Continue amlodipine 10, Metoprolol 50 bid - Renal dopplers were neg per report, TSH was neg - 2-D echo shows mild LVH.   2. Hyponatremia  -supervisor reports that the patient drinks 4 L of soda on a daily basis.  -monitor BMP. 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  - Low but stable.   3. Altered mental status  -CT scan is normal urine analysis is normal U. tox is normal  -LFTs are normal.  - Psychiatric consultation appreciated. Recommend continuing home medications including risperidone and Cogentin for her schizoaffective disorder   4. Schizoaffective Disorder  -continue med regimen as above  - psychiatry input appreciated.     Discharge Exam: Filed Vitals:   10/14/13 0400  BP: 165/94  Pulse: 79  Temp: 98.4 F (36.9 C)  Resp: 18   Alert pleasant Strange affect  General:  Eomi, ncat Cardiovascular: s1 s2 no m/r/g Respiratory: clear  Discharge Instructions  Discharge Orders   Future Orders Complete By Expires   Diet - low sodium heart healthy  As directed    Discharge instructions  As directed    Comments:     You have been diagnosed with Htn.  Recommend continuing the medications that have been started here in the hospital. Recommend lab work from your outpatient physician Recommend close followup with psychiatrist   Increase activity slowly  As directed        Medication List         amLODipine 10 MG tablet  Commonly known as:  NORVASC  Take 1 tablet (10 mg total) by mouth daily.     benztropine 1 MG tablet  Commonly known as:  COGENTIN  Take 1 mg by mouth daily.     metoprolol 50 MG tablet  Commonly known as:  LOPRESSOR  Take 1 tablet (50 mg total) by mouth 2 (two) times daily.     multivitamin with minerals tablet  Take 1 tablet by  mouth daily.     risperiDONE 3 MG tablet  Commonly known as:  RISPERDAL  Take 6 mg by mouth at bedtime.       Allergies  Allergen Reactions  . Codeine Other (See Comments)    unsure  . Phosphate Nausea And Vomiting      The results of significant diagnostics from this hospitalization (including imaging, microbiology, ancillary and laboratory) are listed below for reference.    Significant Diagnostic Studies: Ct Head Wo Contrast  10/11/2013   CLINICAL  DATA:  Hypertension and confusion. History of paranoid schizophrenia.  EXAM: CT HEAD WITHOUT CONTRAST  TECHNIQUE: Contiguous axial images were obtained from the base of the skull through the vertex without intravenous contrast.  COMPARISON:  No priors.  FINDINGS: In the right temporal lobe (images 9-12 of series 2) there are areas of low attenuation, with apparent loss of gray-white differentiation. This region is somewhat affected by beam hardening artifact from skullbase, however, the possibility of edema from subacute ischemia in this region is difficult to entirely exclude. The remainder the brain is otherwise normal in appearance. Specifically, no signs of acute intracranial hemorrhage. No definite mass or mass effect. No hydrocephalus. Visualized paranasal sinuses and mastoids are well pneumatized. No acute displaced skull fractures are identified.  IMPRESSION: 1. Findings that could potentially be seen in the setting of subacute ischemia in the right MCA distribution, specifically in the right temporal lobe, as above. Although this may be artifactual, clinical correlation is recommended, and if there is any clinical concern for acute ischemia in this region, further evaluation with brain MRI would be recommended. These results were called by telephone at the time of interpretation on 10/11/2013 at 4:14 PM to Dr. Glynn Erickson, who verbally acknowledged these results.   Electronically Signed   By: Trudie Reed M.D.   On: 10/11/2013 16:17   Mr Brain Wo Contrast  10/11/2013   CLINICAL DATA:  Altered mental status.  Schizophrenia.  Abnormal CT  EXAM: MRI HEAD WITHOUT CONTRAST  TECHNIQUE: Multiplanar, multiecho pulse sequences of the brain and surrounding structures were obtained without intravenous contrast.  COMPARISON:  CT head from today  FINDINGS: Incomplete study. The patient was unable to tolerate scanning. Sagittal T1, axial and coronal diffusion-weighted imaging was performed. No T2 weighted  imaging. There is motion on the images  Negative for acute infarct. Ventricle size is normal. Right temporal lobe appears normal on diffusion-weighted imaging. Obviously a more complete examination would be helpful however I feel based on the study that the right temporal lobe hypodensity on CT is most likely due to artifact. If symptoms persist, repeat CT or MRI could be performed.  IMPRESSION: No acute abnormality.  No areas of acute infarct identified.  Incomplete study   Electronically Signed   By: Marlan Palau M.D.   On: 10/11/2013 18:24    Microbiology: No results found for this or any previous visit (from the past 240 hour(s)).   Labs: Basic Metabolic Panel:  Recent Labs Lab 10/12/13 0103 10/12/13 0512 10/12/13 0920 10/13/13 0350 10/14/13 0350  NA 137 136*  138 136* 132* 136*  K 3.5* 3.6*  3.7 3.9 3.8 3.8  CL 99 99  99 97 94* 97  CO2 24 25  24 24 25 26   GLUCOSE 96 120*  122* 119* 97 95  BUN 3* 4*  4* 5* 6 8  CREATININE 0.48* 0.50  0.51 0.55 0.61 0.61  CALCIUM 8.9 9.1  9.1 9.0 9.0 8.6   Liver  Function Tests:  Recent Labs Lab 10/11/13 1417 10/12/13 0512  AST 21 15  ALT 21 18  ALKPHOS 68 63  BILITOT 0.4 0.2*  PROT 6.9 6.3  ALBUMIN 4.1 3.4*   No results found for this basename: LIPASE, AMYLASE,  in the last 168 hours No results found for this basename: AMMONIA,  in the last 168 hours CBC:  Recent Labs Lab 10/11/13 1417 10/11/13 1440 10/12/13 0512  WBC 8.3  --  8.3  NEUTROABS 5.6  --   --   HGB 14.2 15.0 14.3  HCT 39.0 44.0 40.2  MCV 84.4  --  85.2  PLT 427*  --  456*   Cardiac Enzymes:  Recent Labs Lab 10/12/13 0020 10/12/13 0512 10/12/13 1045  CKTOTAL 153  --   --   TROPONINI <0.30 <0.30 <0.30   BNP: BNP (last 3 results) No results found for this basename: PROBNP,  in the last 8760 hours CBG: No results found for this basename: GLUCAP,  in the last 168 hours     Signed:  Rhetta Mura  [facility] 10/14/2013,  11:19 AM

## 2013-10-14 NOTE — Progress Notes (Signed)
D/c ordered received; IV removed with gauze on, pt remains in stable condition, pt meds and instructions reviewed and given to pt; also called pts father and reviewed changes to medications, [facility] for them to deliver meds. [facility], father taking back; Lela called and made aware

## 2013-10-14 NOTE — Progress Notes (Signed)
VASCULAR LAB PRELIMINARY  PRELIMINARY  PRELIMINARY  PRELIMINARY  Renal artery duplex completed.    Preliminary report:  No evidence of renal artery stenosis.  Duplicate right renal artery.    Nancy Erickson, RVT 10/14/2013, 2:55 PM

## 2013-10-22 DIAGNOSIS — I10 Essential (primary) hypertension: Secondary | ICD-10-CM | POA: Diagnosis not present

## 2013-10-22 DIAGNOSIS — E871 Hypo-osmolality and hyponatremia: Secondary | ICD-10-CM | POA: Diagnosis not present

## 2013-10-25 DIAGNOSIS — E871 Hypo-osmolality and hyponatremia: Secondary | ICD-10-CM | POA: Diagnosis not present

## 2013-10-25 DIAGNOSIS — Z23 Encounter for immunization: Secondary | ICD-10-CM | POA: Diagnosis not present

## 2013-10-29 DIAGNOSIS — E871 Hypo-osmolality and hyponatremia: Secondary | ICD-10-CM | POA: Diagnosis not present

## 2013-11-03 DIAGNOSIS — R7989 Other specified abnormal findings of blood chemistry: Secondary | ICD-10-CM | POA: Diagnosis not present

## 2013-11-03 DIAGNOSIS — I872 Venous insufficiency (chronic) (peripheral): Secondary | ICD-10-CM | POA: Diagnosis not present

## 2013-11-03 DIAGNOSIS — E871 Hypo-osmolality and hyponatremia: Secondary | ICD-10-CM | POA: Diagnosis not present

## 2013-11-03 DIAGNOSIS — F17200 Nicotine dependence, unspecified, uncomplicated: Secondary | ICD-10-CM | POA: Diagnosis not present

## 2013-11-07 ENCOUNTER — Emergency Department (HOSPITAL_COMMUNITY)
Admission: EM | Admit: 2013-11-07 | Discharge: 2013-11-07 | Disposition: A | Discharge status: Home / Self Care | Payer: Medicare Other | Attending: Emergency Medicine | Admitting: Emergency Medicine

## 2013-11-07 ENCOUNTER — Emergency Department (HOSPITAL_COMMUNITY): Payer: Medicare Other

## 2013-11-07 ENCOUNTER — Encounter (HOSPITAL_COMMUNITY): Payer: Self-pay | Admitting: Emergency Medicine

## 2013-11-07 DIAGNOSIS — Z9119 Patient's noncompliance with other medical treatment and regimen: Secondary | ICD-10-CM | POA: Insufficient documentation

## 2013-11-07 DIAGNOSIS — F200 Paranoid schizophrenia: Secondary | ICD-10-CM | POA: Insufficient documentation

## 2013-11-07 DIAGNOSIS — R000 Tachycardia, unspecified: Secondary | ICD-10-CM | POA: Insufficient documentation

## 2013-11-07 DIAGNOSIS — F17200 Nicotine dependence, unspecified, uncomplicated: Secondary | ICD-10-CM | POA: Insufficient documentation

## 2013-11-07 DIAGNOSIS — R6889 Other general symptoms and signs: Secondary | ICD-10-CM | POA: Diagnosis not present

## 2013-11-07 DIAGNOSIS — Z885 Allergy status to narcotic agent status: Secondary | ICD-10-CM | POA: Diagnosis not present

## 2013-11-07 DIAGNOSIS — Z79899 Other long term (current) drug therapy: Secondary | ICD-10-CM | POA: Insufficient documentation

## 2013-11-07 DIAGNOSIS — E871 Hypo-osmolality and hyponatremia: Secondary | ICD-10-CM | POA: Diagnosis not present

## 2013-11-07 DIAGNOSIS — Z9114 Patient's other noncompliance with medication regimen: Secondary | ICD-10-CM

## 2013-11-07 DIAGNOSIS — Z888 Allergy status to other drugs, medicaments and biological substances status: Secondary | ICD-10-CM | POA: Insufficient documentation

## 2013-11-07 DIAGNOSIS — Z91199 Patient's noncompliance with other medical treatment and regimen due to unspecified reason: Secondary | ICD-10-CM | POA: Diagnosis not present

## 2013-11-07 DIAGNOSIS — R4182 Altered mental status, unspecified: Secondary | ICD-10-CM | POA: Diagnosis not present

## 2013-11-07 DIAGNOSIS — I119 Hypertensive heart disease without heart failure: Secondary | ICD-10-CM | POA: Diagnosis not present

## 2013-11-07 LAB — URINALYSIS, ROUTINE W REFLEX MICROSCOPIC
Bilirubin Urine: NEGATIVE
Glucose, UA: NEGATIVE mg/dL
Hgb urine dipstick: NEGATIVE
Ketones, ur: NEGATIVE mg/dL
LEUKOCYTES UA: NEGATIVE
Nitrite: NEGATIVE
PROTEIN: NEGATIVE mg/dL
Specific Gravity, Urine: 1.009 (ref 1.005–1.030)
UROBILINOGEN UA: 0.2 mg/dL (ref 0.0–1.0)
pH: 6.5 (ref 5.0–8.0)

## 2013-11-07 LAB — CBC WITH DIFFERENTIAL/PLATELET
BASOS PCT: 0 % (ref 0–1)
Basophils Absolute: 0 10*3/uL (ref 0.0–0.1)
Eosinophils Absolute: 0 10*3/uL (ref 0.0–0.7)
Eosinophils Relative: 0 % (ref 0–5)
HCT: 38.1 % (ref 36.0–46.0)
Hemoglobin: 13.7 g/dL (ref 12.0–15.0)
LYMPHS PCT: 18 % (ref 12–46)
Lymphs Abs: 1.8 10*3/uL (ref 0.7–4.0)
MCH: 30 pg (ref 26.0–34.0)
MCHC: 36 g/dL (ref 30.0–36.0)
MCV: 83.6 fL (ref 78.0–100.0)
MONO ABS: 0.6 10*3/uL (ref 0.1–1.0)
MONOS PCT: 6 % (ref 3–12)
NEUTROS ABS: 7.3 10*3/uL (ref 1.7–7.7)
Neutrophils Relative %: 76 % (ref 43–77)
Platelets: 426 10*3/uL — ABNORMAL HIGH (ref 150–400)
RBC: 4.56 MIL/uL (ref 3.87–5.11)
RDW: 13.6 % (ref 11.5–15.5)
WBC: 9.7 10*3/uL (ref 4.0–10.5)

## 2013-11-07 LAB — I-STAT CHEM 8, ED
BUN: 11 mg/dL (ref 6–23)
CALCIUM ION: 1.08 mmol/L — AB (ref 1.12–1.23)
Chloride: 100 mEq/L (ref 96–112)
Creatinine, Ser: 0.7 mg/dL (ref 0.50–1.10)
GLUCOSE: 114 mg/dL — AB (ref 70–99)
HCT: 42 % (ref 36.0–46.0)
Hemoglobin: 14.3 g/dL (ref 12.0–15.0)
Potassium: 4.3 mEq/L (ref 3.7–5.3)
Sodium: 134 mEq/L — ABNORMAL LOW (ref 137–147)
TCO2: 23 mmol/L (ref 0–100)

## 2013-11-07 LAB — RAPID URINE DRUG SCREEN, HOSP PERFORMED
AMPHETAMINES: NOT DETECTED
Barbiturates: NOT DETECTED
Benzodiazepines: NOT DETECTED
Cocaine: NOT DETECTED
Opiates: NOT DETECTED
TETRAHYDROCANNABINOL: NOT DETECTED

## 2013-11-07 MED ORDER — AMLODIPINE BESYLATE 10 MG PO TABS
10.0000 mg | ORAL_TABLET | Freq: Once | ORAL | Status: AC
  Administered 2013-11-07: 10 mg via ORAL
  Filled 2013-11-07: qty 1

## 2013-11-07 MED ORDER — BENZTROPINE MESYLATE 1 MG/ML IJ SOLN: 1.0000 mg | Freq: Once | INTRAMUSCULAR | Status: DC

## 2013-11-07 MED ORDER — METOPROLOL TARTRATE 25 MG PO TABS
50.0000 mg | ORAL_TABLET | Freq: Once | ORAL | Status: AC
  Administered 2013-11-07: 50 mg via ORAL
  Filled 2013-11-07: qty 2

## 2013-11-07 MED ORDER — RISPERIDONE 0.5 MG PO TABS
3.0000 mg | ORAL_TABLET | Freq: Once | ORAL | Status: AC
  Administered 2013-11-07: 3 mg via ORAL
  Filled 2013-11-07: qty 6

## 2013-11-07 MED ORDER — BENZTROPINE MESYLATE 1 MG PO TABS
1.0000 mg | ORAL_TABLET | Freq: Every day | ORAL | Status: DC
  Administered 2013-11-07: 1 mg via ORAL
  Filled 2013-11-07: qty 1

## 2013-11-07 NOTE — ED Provider Notes (Signed)
CSN: [id number]     Arrival date & time 11/07/13  1859 History   First MD Initiated Contact with Patient 11/07/13 1958     Chief Complaint  Patient presents with  . Altered Mental Status     (Consider location/radiation/quality/duration/timing/severity/associated sxs/prior Treatment) HPI Comments: Patient was sent from [facility], independent living because of behavioral change.  States, that she's been acting differently, but a very nonspecific changes.  Patient is mumbling at this time.  It was hard to ascertain, exactly what's going on.  She does answer yes and no questions.  She denies having any pain.  She states her answers yes that she is eating.  She states no she is not having diarrhea, or constipation.  Patient is a 58 y.o. female presenting with altered mental status. The history is provided by a caregiver.  Altered Mental Status Presenting symptoms: behavior changes   Severity:  Moderate Most recent episode:  More than 2 days ago Episode history:  Unable to specify Timing:  Constant Associated symptoms: no abdominal pain, no fever and no headaches     Past Medical History  Diagnosis Date  . Paranoid schizophrenia    History reviewed. No pertinent past surgical history. No family history on file. History  Substance Use Topics  . Smoking status: Current Every Day Smoker -- 1.00 packs/day    Types: Cigarettes  . Smokeless tobacco: Not on file  . Alcohol Use: No   OB History   Grav Para Term Preterm Abortions TAB SAB Ect Mult Living                 Review of Systems  Constitutional: Negative for fever.  Gastrointestinal: Negative for abdominal pain.  Musculoskeletal: Negative for back pain.  Neurological: Negative for headaches.  All other systems reviewed and are negative.      Allergies  Phosphate; Artane; and Codeine  Home Medications   No current outpatient prescriptions on file. BP 177/88  Pulse 100  Temp(Src) 98.4 F (36.9 C) (Oral)  Resp 16   Ht 5\' 6"  (1.676 m)  Wt 140 lb (63.504 kg)  BMI 22.61 kg/m2  SpO2 96% Physical Exam  Nursing note and vitals reviewed. Constitutional: She appears well-developed and well-nourished.  HENT:  Head: Normocephalic.  Mouth/Throat: Uvula is midline.  Loops appear chapped  Eyes: Pupils are equal, round, and reactive to light.  Neck: Normal range of motion.  Cardiovascular: Regular rhythm.  Tachycardia present.   Pulmonary/Chest: Effort normal.  Abdominal: Soft.  Musculoskeletal: Normal range of motion. She exhibits no edema and no tenderness.  Neurological:  Patient follows commands moving all extremities  Skin: Skin is warm and dry. No rash noted.    ED Course  Procedures (including critical care time) Labs Review Labs Reviewed  CBC WITH DIFFERENTIAL - Abnormal; Notable for the following:    Platelets 426 (*)    All other components within normal limits  I-STAT CHEM 8, ED - Abnormal; Notable for the following:    Sodium 134 (*)    Glucose, Bld 114 (*)    Calcium, Ion 1.08 (*)    All other components within normal limits  URINE RAPID DRUG SCREEN (HOSP PERFORMED)  URINALYSIS, ROUTINE W REFLEX MICROSCOPIC   Imaging Review Ct Head Wo Contrast  11/07/2013   CLINICAL DATA:  Altered mental status.  EXAM: CT HEAD WITHOUT CONTRAST  TECHNIQUE: Contiguous axial images were obtained from the base of the skull through the vertex without intravenous contrast.  COMPARISON:  CT  of the head and MRI of the brain performed 10/11/2013  FINDINGS: There is no evidence of acute infarction, mass lesion, or intra- or extra-axial hemorrhage on CT.  The slightly unusual decreased attenuation within the right temporal lobe is grossly unchanged and appears to reflect the patient's baseline.  The posterior fossa, including the cerebellum, brainstem and fourth ventricle, is within normal limits. The third and lateral ventricles, and basal ganglia are unremarkable in appearance. The cerebral hemispheres are  otherwise symmetric in appearance, with normal gray-white differentiation. No mass effect or midline shift is seen.  There is no evidence of fracture; visualized osseous structures are unremarkable in appearance. The visualized portions of the orbits are within normal limits. The paranasal sinuses and mastoid air cells are well-aerated. No significant soft tissue abnormalities are seen.  IMPRESSION: Unremarkable noncontrast CT of the head.   Electronically Signed   By: Roanna RaiderJeffery  Chang M.D.   On: 11/07/2013 23:23     EKG Interpretation None      MDM  Family arrived stating patient is at her baseline mental status but has not take her medications for the past 2 days because she could not fill them over the hoiliday weekend  Will give Risperidone, Lopressor and Norvasc and DC home  Patient writes that she will be able to refill her prescriptions in the morning  Family agrees that they will make sure that happens  She denies SI/HI has been cooperative and calm throughout the ED visit I feel this patient is no danger ot self or other and does not need a Psy evalaution at this time She is does not appear ill, she is mildly hypertensive and has been give her normal HTN medication prior to discharge  Final diagnoses:  Non compliance w medication regimen  Hyponatremia         Arman FilterGail K Gracelin Weisberg, NP 11/07/13 2346

## 2013-11-07 NOTE — ED Notes (Addendum)
[facility] EMS [facility], staff states pt has been acting differently but could not explain how or when it started but staff sts they "knew" it was going to happen because they found soda bottles in her apartment on Friday and pt is not supposed to have soda. EMS sts mother is enroute.   Per EMS pt alert and oriented x4 and passed stroke screen. Staff states pt speech is at baseline.

## 2013-11-07 NOTE — ED Notes (Signed)
Noel ChristmasHarold Hetzfeld (father) sts up to her previous visit [facility] she was stable, pt was well controlled paranoid schizophrenia, then all of a sudden she ends up in the hospital a couple of months ago, and she came back and followed up for hyponatremia told to reduce salt and soda intake. Father sts saw patient Tuesday and she looked fine.

## 2013-11-07 NOTE — Discharge Instructions (Signed)
Hyponatremia  Hyponatremia is when the amount of salt (sodium) in your blood is too low. When sodium levels are low, your cells will absorb extra water and swell. The swelling happens throughout the body, but it mostly affects the brain. Severe brain swelling (cerebral edema), seizures, or coma can happen.  CAUSES   Heart, kidney, or liver problems.  Thyroid problems.  Adrenal gland problems.  Severe vomiting and diarrhea.  Certain medicines or illegal drugs.  Dehydration.  Drinking too much water.  Low-sodium diet. SYMPTOMS   Nausea and vomiting.  Confusion.  Lethargy.  Agitation.  Headache.  Twitching or shaking (seizures).  Unconsciousness.  Appetite loss.  Muscle weakness and cramping. DIAGNOSIS  Hyponatremia is identified by a simple blood test. Your caregiver will perform a history and physical exam to try to find the cause and type of hyponatremia. Other tests may be needed to measure the amount of sodium in your blood and urine. TREATMENT  Treatment will depend on the cause.   Fluids may be given through the vein (IV).  Medicines may be used to correct the sodium imbalance. If medicines are causing the problem, they will need to be adjusted.  Water or fluid intake may be restricted to restore proper balance. The speed of correcting the sodium problem is very important. If the problem is corrected too fast, nerve damage (sometimes unchangeable) can happen. HOME CARE INSTRUCTIONS   Only take medicines as directed by your caregiver. Many medicines can make hyponatremia worse. Discuss all your medicines with your caregiver.  Carefully follow any recommended diet, including any fluid restrictions.  You may be asked to repeat lab tests. Follow these directions.  Avoid alcohol and recreational drugs. SEEK MEDICAL CARE IF:   You develop worsening nausea, fatigue, headache, confusion, or weakness.  Your original hyponatremia symptoms return.  You have  problems following the recommended diet. SEEK IMMEDIATE MEDICAL CARE IF:   You have a seizure.  You faint.  You have ongoing diarrhea or vomiting. MAKE SURE YOU:   Understand these instructions.  Will watch your condition.  Will get help right away if you are not doing well or get worse. Document Released: 07/12/2002 Document Revised: 10/14/2011 Document Reviewed: 01/06/2011 [facility] Patient Information 2014 [place], [place]. It is okay to drink soda  BUT you must have a glass of water of the same size of water following it and before your next soda  Make sure to fill your prescriptions in the morning and take all medications as prescribed on a regular basis

## 2013-11-08 NOTE — ED Provider Notes (Signed)
Medical screening examination/treatment/procedure(s) were performed by non-physician practitioner and as supervising physician I was immediately available for consultation/collaboration.   EKG Interpretation None        Layla MawKristen N Zimri Brennen, DO 11/08/13 0005

## 2013-11-09 DIAGNOSIS — F209 Schizophrenia, unspecified: Secondary | ICD-10-CM | POA: Diagnosis not present

## 2013-11-22 ENCOUNTER — Encounter (HOSPITAL_COMMUNITY): Payer: Self-pay | Admitting: Emergency Medicine

## 2013-11-22 ENCOUNTER — Emergency Department (HOSPITAL_COMMUNITY)
Admission: EM | Admit: 2013-11-22 | Discharge: 2013-11-22 | Disposition: A | Discharge status: Home / Self Care | Payer: Medicare Other | Attending: Emergency Medicine | Admitting: Emergency Medicine

## 2013-11-22 DIAGNOSIS — T6591XA Toxic effect of unspecified substance, accidental (unintentional), initial encounter: Secondary | ICD-10-CM

## 2013-11-22 DIAGNOSIS — F200 Paranoid schizophrenia: Secondary | ICD-10-CM | POA: Diagnosis not present

## 2013-11-22 DIAGNOSIS — Z79899 Other long term (current) drug therapy: Secondary | ICD-10-CM | POA: Insufficient documentation

## 2013-11-22 DIAGNOSIS — IMO0002 Reserved for concepts with insufficient information to code with codable children: Secondary | ICD-10-CM | POA: Diagnosis not present

## 2013-11-22 DIAGNOSIS — Y9389 Activity, other specified: Secondary | ICD-10-CM | POA: Insufficient documentation

## 2013-11-22 DIAGNOSIS — F17200 Nicotine dependence, unspecified, uncomplicated: Secondary | ICD-10-CM | POA: Diagnosis not present

## 2013-11-22 DIAGNOSIS — T465X1A Poisoning by other antihypertensive drugs, accidental (unintentional), initial encounter: Secondary | ICD-10-CM | POA: Diagnosis not present

## 2013-11-22 DIAGNOSIS — T50991A Poisoning by other drugs, medicaments and biological substances, accidental (unintentional), initial encounter: Secondary | ICD-10-CM | POA: Diagnosis not present

## 2013-11-22 DIAGNOSIS — T46901A Poisoning by unspecified agents primarily affecting the cardiovascular system, accidental (unintentional), initial encounter: Secondary | ICD-10-CM | POA: Insufficient documentation

## 2013-11-22 DIAGNOSIS — Y92009 Unspecified place in unspecified non-institutional (private) residence as the place of occurrence of the external cause: Secondary | ICD-10-CM | POA: Insufficient documentation

## 2013-11-22 DIAGNOSIS — T50901A Poisoning by unspecified drugs, medicaments and biological substances, accidental (unintentional), initial encounter: Secondary | ICD-10-CM | POA: Diagnosis not present

## 2013-11-22 NOTE — ED Notes (Signed)
Pt lives at assisted living facility for schizophrenia.  Per staff, pt took her regular medications at 0930 am, forgot, and took all of her meds again at 1430.  Staff called poison control and they advised staff to come to ED for metoprolol only, seeing as pt has only been on this med for several weeks.

## 2013-11-22 NOTE — Progress Notes (Signed)
ED CM consulted by Dr. Venora Maples to meet with patient concerning home medication management. Patient presented to [facility] ED with possible accidentally ingesting too many of her medications. Pt has had 3 ED visits and 1 hospitalization in the past 6 months. PMH HTN (hypertension) Hypertensive urgency Schizo-affective schizophrenia Hyponatremia Altered mental state. Pt is resident at [facility] for the past 20 years. Met with patient and family in consultation room "B",  Consent given to discuss care in the presence of  Nancy Erickson [phone] and Nancy Erickson [phone]. Family is supportive and involved in care. Pt stated, "I am not sure if I took too many medicines today." Information was unclear as if she is being followed by [facility]. As per patient and family patient has an appointment 4/22 with PCP Dr.Thacther. She stated, she gets confused with the medicines. Discussed [facility] services for medication management and [facility] services. Pt and family verbalized understanding and agreeable with  Plan. List was provided and [facility] was selected. Referral was placed by email with Pearletha Forge RN. Explained that we would  need to contact [facility] in the am to place a referral for f/u and request an ACT counselor. Patient and family verbalized understanding and is agreeable with discharge plan. Informed that someone from [facility] will be contacting them within 24-48 hours, Patient and family agreeable. ED CM will contact them tomorrow once contact have been made with CSW and [facility]. Discuss plan with Dr. Venora Maples, and he is agreeable with discharge plan. Provided  Patient and family with my contact information, if any further questions or concerns should arise.

## 2013-11-22 NOTE — ED Provider Notes (Signed)
CSN: [id number]     Arrival date & time 11/22/13  1635 History   First MD Initiated Contact with Patient 11/22/13 1713     Chief Complaint  Patient presents with  . Ingestion     HPI Patient brought to the emergency department because of possible accidental ingestion of her home medications including her metoprolol.  Family suspects that she may have taken for her metoprolol dose is today.  Patient has no complaints.  Patient schizophrenia and stays at a facility.   Past Medical History  Diagnosis Date  . Paranoid schizophrenia    No past surgical history on file. No family history on file. History  Substance Use Topics  . Smoking status: Current Every Day Smoker -- 1.00 packs/day    Types: Cigarettes  . Smokeless tobacco: Not on file  . Alcohol Use: No   OB History   Grav Para Term Preterm Abortions TAB SAB Ect Mult Living                 Review of Systems  All other systems reviewed and are negative.     Allergies  Phosphate; Artane; and Codeine  Home Medications   Prior to Admission medications   Medication Sig Start Date End Date Taking? Authorizing Provider  amLODipine (NORVASC) 10 MG tablet Take 10 mg by mouth daily.   Yes Historical Provider, MD  ARIPiprazole (ABILIFY) 10 MG tablet Take 10 mg by mouth daily.   Yes Historical Provider, MD  benztropine (COGENTIN) 1 MG tablet Take 1 mg by mouth daily.   Yes Historical Provider, MD  metoprolol (LOPRESSOR) 50 MG tablet Take 50 mg by mouth 2 (two) times daily.   Yes Historical Provider, MD  risperiDONE (RISPERDAL) 3 MG tablet Take 3 mg by mouth 2 (two) times daily.   Yes Historical Provider, MD   BP 114/71  Pulse 78  Temp(Src) 97.5 F (36.4 C) (Oral)  Resp 16  SpO2 97% Physical Exam  Nursing note and vitals reviewed. Constitutional: She is oriented to person, place, and time. She appears well-developed and well-nourished. No distress.  HENT:  Head: Normocephalic and atraumatic.  Eyes: EOM are normal.   Neck: Normal range of motion.  Cardiovascular: Normal rate, regular rhythm and normal heart sounds.   Pulmonary/Chest: Effort normal and breath sounds normal.  Abdominal: Soft. She exhibits no distension. There is no tenderness.  Musculoskeletal: Normal range of motion.  Neurological: She is alert and oriented to person, place, and time.  Skin: Skin is warm and dry.  Psychiatric: She has a normal mood and affect. Judgment normal.    ED Course  Procedures (including critical care time) Labs Review Labs Reviewed - No data to display  Imaging Review No results found.   EKG Interpretation   Date/Time:  Monday November 22 2013 18:41:27 EDT Ventricular Rate:  72 PR Interval:  146 QRS Duration: 76 QT Interval:  394 QTC Calculation: 431 R Axis:   82 Text Interpretation:  Normal sinus rhythm Possible Left atrial enlargement  Borderline ECG No significant change was found Confirmed by Valleri Hendricksen  MD,  Caryn BeeKEVIN ([id number]) on 11/22/2013 7:41:35 PM      MDM   Final diagnoses:  Accidental ingestion of substance    Overall well-appearing.  He now breakout he appeared no AV block.  Discharge home in good condition.  I spoke with case management who is able to assist the patient and the patient's family with medication delivering at her facility.    Vania ReaKevin M  Patria Maneampos, MD 11/22/13 [phone]

## 2013-11-22 NOTE — ED Notes (Signed)
Pt denies any pain or dizziness.  VS stable.

## 2013-11-23 NOTE — Progress Notes (Signed)
CSW spoke with pt/family in regards [facility] CSW informed pt/family that pt has an appt scheduled May 13,2015 at 1:40 pm. CSW informed pt/family that it is [facility]'s policy that after a pt leaves the hospital they request that the pt follow up within a 24 to 48 hour period and walk in to be seen.Also, pt/family was informed about ACTT services and the step to pursue. Pt/family voiced understanding and reported they will take necessary steps to pursue services.    [address]is Deondray [place], [place]CS[place] [phone]

## 2013-11-24 ENCOUNTER — Other Ambulatory Visit: Payer: Self-pay | Admitting: Family Medicine

## 2013-11-24 ENCOUNTER — Ambulatory Visit
Admission: RE | Admit: 2013-11-24 | Discharge: 2013-11-24 | Disposition: A | Discharge status: Home / Self Care | Payer: Medicare Other | Source: Ambulatory Visit | Attending: Family Medicine | Admitting: Family Medicine

## 2013-11-24 ENCOUNTER — Encounter (INDEPENDENT_AMBULATORY_CARE_PROVIDER_SITE_OTHER): Payer: Self-pay

## 2013-11-24 DIAGNOSIS — R059 Cough, unspecified: Secondary | ICD-10-CM

## 2013-11-24 DIAGNOSIS — R05 Cough: Secondary | ICD-10-CM

## 2013-11-24 DIAGNOSIS — Z9119 Patient's noncompliance with other medical treatment and regimen: Secondary | ICD-10-CM | POA: Diagnosis not present

## 2013-11-24 DIAGNOSIS — Z91199 Patient's noncompliance with other medical treatment and regimen due to unspecified reason: Secondary | ICD-10-CM | POA: Diagnosis not present

## 2013-11-24 DIAGNOSIS — I10 Essential (primary) hypertension: Secondary | ICD-10-CM | POA: Diagnosis not present

## 2013-11-24 DIAGNOSIS — F29 Unspecified psychosis not due to a substance or known physiological condition: Secondary | ICD-10-CM | POA: Diagnosis not present

## 2013-11-24 DIAGNOSIS — L089 Local infection of the skin and subcutaneous tissue, unspecified: Secondary | ICD-10-CM | POA: Diagnosis not present

## 2013-11-24 DIAGNOSIS — F200 Paranoid schizophrenia: Secondary | ICD-10-CM | POA: Diagnosis not present

## 2013-11-24 DIAGNOSIS — E871 Hypo-osmolality and hyponatremia: Secondary | ICD-10-CM | POA: Diagnosis not present

## 2013-11-25 DIAGNOSIS — L089 Local infection of the skin and subcutaneous tissue, unspecified: Secondary | ICD-10-CM | POA: Diagnosis not present

## 2013-11-25 DIAGNOSIS — F200 Paranoid schizophrenia: Secondary | ICD-10-CM | POA: Diagnosis not present

## 2013-11-25 DIAGNOSIS — Z9119 Patient's noncompliance with other medical treatment and regimen: Secondary | ICD-10-CM | POA: Diagnosis not present

## 2013-11-25 DIAGNOSIS — Z91199 Patient's noncompliance with other medical treatment and regimen due to unspecified reason: Secondary | ICD-10-CM | POA: Diagnosis not present

## 2013-11-25 DIAGNOSIS — I10 Essential (primary) hypertension: Secondary | ICD-10-CM | POA: Diagnosis not present

## 2013-11-26 DIAGNOSIS — F200 Paranoid schizophrenia: Secondary | ICD-10-CM | POA: Diagnosis not present

## 2013-11-26 DIAGNOSIS — I10 Essential (primary) hypertension: Secondary | ICD-10-CM | POA: Diagnosis not present

## 2013-11-26 DIAGNOSIS — Z9119 Patient's noncompliance with other medical treatment and regimen: Secondary | ICD-10-CM | POA: Diagnosis not present

## 2013-11-26 DIAGNOSIS — L089 Local infection of the skin and subcutaneous tissue, unspecified: Secondary | ICD-10-CM | POA: Diagnosis not present

## 2013-11-26 DIAGNOSIS — Z91199 Patient's noncompliance with other medical treatment and regimen due to unspecified reason: Secondary | ICD-10-CM | POA: Diagnosis not present

## 2013-11-29 DIAGNOSIS — I10 Essential (primary) hypertension: Secondary | ICD-10-CM | POA: Diagnosis not present

## 2013-11-29 DIAGNOSIS — L089 Local infection of the skin and subcutaneous tissue, unspecified: Secondary | ICD-10-CM | POA: Diagnosis not present

## 2013-11-29 DIAGNOSIS — Z91199 Patient's noncompliance with other medical treatment and regimen due to unspecified reason: Secondary | ICD-10-CM | POA: Diagnosis not present

## 2013-11-29 DIAGNOSIS — Z9119 Patient's noncompliance with other medical treatment and regimen: Secondary | ICD-10-CM | POA: Diagnosis not present

## 2013-11-29 DIAGNOSIS — F200 Paranoid schizophrenia: Secondary | ICD-10-CM | POA: Diagnosis not present

## 2013-11-30 DIAGNOSIS — L089 Local infection of the skin and subcutaneous tissue, unspecified: Secondary | ICD-10-CM | POA: Diagnosis not present

## 2013-11-30 DIAGNOSIS — F200 Paranoid schizophrenia: Secondary | ICD-10-CM | POA: Diagnosis not present

## 2013-11-30 DIAGNOSIS — I10 Essential (primary) hypertension: Secondary | ICD-10-CM | POA: Diagnosis not present

## 2013-11-30 DIAGNOSIS — Z91199 Patient's noncompliance with other medical treatment and regimen due to unspecified reason: Secondary | ICD-10-CM | POA: Diagnosis not present

## 2013-11-30 DIAGNOSIS — Z9119 Patient's noncompliance with other medical treatment and regimen: Secondary | ICD-10-CM | POA: Diagnosis not present

## 2013-11-30 NOTE — Progress Notes (Signed)
EDCM [facility]. Returned  Call made to [phone], Spoke with mother Sedalia MutaDiane, family wanting assistance with finding a MicrobiologistGeriatric Psychiatrist locally.  Discussed assisting with  Google search or contacting the insurance to find a network provider.  Dr. Donell BeersPlovsky from [facility] listed in Google. Phone number was given. Encouraged to also check insurance network. Verbalized understanding. No further ED CM needs identified.

## 2013-12-01 DIAGNOSIS — Z91199 Patient's noncompliance with other medical treatment and regimen due to unspecified reason: Secondary | ICD-10-CM | POA: Diagnosis not present

## 2013-12-01 DIAGNOSIS — L089 Local infection of the skin and subcutaneous tissue, unspecified: Secondary | ICD-10-CM | POA: Diagnosis not present

## 2013-12-01 DIAGNOSIS — Z9119 Patient's noncompliance with other medical treatment and regimen: Secondary | ICD-10-CM | POA: Diagnosis not present

## 2013-12-01 DIAGNOSIS — E871 Hypo-osmolality and hyponatremia: Secondary | ICD-10-CM | POA: Diagnosis not present

## 2013-12-01 DIAGNOSIS — I10 Essential (primary) hypertension: Secondary | ICD-10-CM | POA: Diagnosis not present

## 2013-12-01 DIAGNOSIS — I8310 Varicose veins of unspecified lower extremity with inflammation: Secondary | ICD-10-CM | POA: Diagnosis not present

## 2013-12-01 DIAGNOSIS — F200 Paranoid schizophrenia: Secondary | ICD-10-CM | POA: Diagnosis not present

## 2013-12-02 DIAGNOSIS — F200 Paranoid schizophrenia: Secondary | ICD-10-CM | POA: Diagnosis not present

## 2013-12-02 DIAGNOSIS — Z91199 Patient's noncompliance with other medical treatment and regimen due to unspecified reason: Secondary | ICD-10-CM | POA: Diagnosis not present

## 2013-12-02 DIAGNOSIS — I10 Essential (primary) hypertension: Secondary | ICD-10-CM | POA: Diagnosis not present

## 2013-12-02 DIAGNOSIS — Z9119 Patient's noncompliance with other medical treatment and regimen: Secondary | ICD-10-CM | POA: Diagnosis not present

## 2013-12-02 DIAGNOSIS — L089 Local infection of the skin and subcutaneous tissue, unspecified: Secondary | ICD-10-CM | POA: Diagnosis not present

## 2013-12-03 DIAGNOSIS — F200 Paranoid schizophrenia: Secondary | ICD-10-CM | POA: Diagnosis not present

## 2013-12-03 DIAGNOSIS — Z91199 Patient's noncompliance with other medical treatment and regimen due to unspecified reason: Secondary | ICD-10-CM | POA: Diagnosis not present

## 2013-12-03 DIAGNOSIS — I10 Essential (primary) hypertension: Secondary | ICD-10-CM | POA: Diagnosis not present

## 2013-12-03 DIAGNOSIS — Z9119 Patient's noncompliance with other medical treatment and regimen: Secondary | ICD-10-CM | POA: Diagnosis not present

## 2013-12-03 DIAGNOSIS — L089 Local infection of the skin and subcutaneous tissue, unspecified: Secondary | ICD-10-CM | POA: Diagnosis not present

## 2013-12-06 NOTE — Progress Notes (Signed)
CM received phone call from [facility] Stockert, the patient's father, with questions regarding the [facility] last ED discharge. Jake SharkHarold stated that Cleaster CorinLeela Glancy, [employer] manager, took the patient [facility] but he has not seen or heard of any resources that have transpired in the patients home since the assessment visit. CM then called [facility] and spoke with Rogelio SeenMcCall and she stated that due to HIPPA they cannot release any information about the patient. Rogelio SeenMcCall stated that the father would have to be the legal guardian in order to receive information about the patient. CM called Jake SharkHarold back and explained that that [facility] did confirm that his daughter was assessed on 11/26/13 but due to confidentiality could not share any further information. Encouraged Jake SharkHarold to call [facility] at [phone] and inquire about having his daughter sign consent for release of information so he can access the patient plan and play a role in her metal health care. Also encouraged Jake SharkHarold or his wife to attend any medical or mental health appointments with daughter to better assist and follow up on plan of care. Jake SharkHarold was appreciative of information provided. Ferdinand CavaAndrea Schettino, RN, BSN, Case Managers 12/06/2013 11:36 AM

## 2013-12-07 DIAGNOSIS — F200 Paranoid schizophrenia: Secondary | ICD-10-CM | POA: Diagnosis not present

## 2013-12-07 DIAGNOSIS — I10 Essential (primary) hypertension: Secondary | ICD-10-CM | POA: Diagnosis not present

## 2013-12-07 DIAGNOSIS — Z91199 Patient's noncompliance with other medical treatment and regimen due to unspecified reason: Secondary | ICD-10-CM | POA: Diagnosis not present

## 2013-12-07 DIAGNOSIS — L089 Local infection of the skin and subcutaneous tissue, unspecified: Secondary | ICD-10-CM | POA: Diagnosis not present

## 2013-12-07 DIAGNOSIS — Z9119 Patient's noncompliance with other medical treatment and regimen: Secondary | ICD-10-CM | POA: Diagnosis not present

## 2013-12-08 DIAGNOSIS — L089 Local infection of the skin and subcutaneous tissue, unspecified: Secondary | ICD-10-CM | POA: Diagnosis not present

## 2013-12-08 DIAGNOSIS — Z9119 Patient's noncompliance with other medical treatment and regimen: Secondary | ICD-10-CM | POA: Diagnosis not present

## 2013-12-08 DIAGNOSIS — I10 Essential (primary) hypertension: Secondary | ICD-10-CM | POA: Diagnosis not present

## 2013-12-08 DIAGNOSIS — Z91199 Patient's noncompliance with other medical treatment and regimen due to unspecified reason: Secondary | ICD-10-CM | POA: Diagnosis not present

## 2013-12-08 DIAGNOSIS — F200 Paranoid schizophrenia: Secondary | ICD-10-CM | POA: Diagnosis not present

## 2013-12-09 DIAGNOSIS — Z91199 Patient's noncompliance with other medical treatment and regimen due to unspecified reason: Secondary | ICD-10-CM | POA: Diagnosis not present

## 2013-12-09 DIAGNOSIS — I10 Essential (primary) hypertension: Secondary | ICD-10-CM | POA: Diagnosis not present

## 2013-12-09 DIAGNOSIS — F200 Paranoid schizophrenia: Secondary | ICD-10-CM | POA: Diagnosis not present

## 2013-12-09 DIAGNOSIS — Z9119 Patient's noncompliance with other medical treatment and regimen: Secondary | ICD-10-CM | POA: Diagnosis not present

## 2013-12-09 DIAGNOSIS — L089 Local infection of the skin and subcutaneous tissue, unspecified: Secondary | ICD-10-CM | POA: Diagnosis not present

## 2013-12-10 DIAGNOSIS — F209 Schizophrenia, unspecified: Secondary | ICD-10-CM | POA: Diagnosis not present

## 2013-12-14 ENCOUNTER — Other Ambulatory Visit: Payer: Self-pay | Admitting: Obstetrics & Gynecology

## 2013-12-14 ENCOUNTER — Other Ambulatory Visit (HOSPITAL_COMMUNITY)
Admission: RE | Admit: 2013-12-14 | Discharge: 2013-12-14 | Disposition: A | Discharge status: Home / Self Care | Payer: Medicare Other | Source: Ambulatory Visit | Attending: Obstetrics & Gynecology | Admitting: Obstetrics & Gynecology

## 2013-12-14 DIAGNOSIS — N950 Postmenopausal bleeding: Secondary | ICD-10-CM | POA: Diagnosis not present

## 2013-12-14 DIAGNOSIS — Z124 Encounter for screening for malignant neoplasm of cervix: Secondary | ICD-10-CM | POA: Insufficient documentation

## 2013-12-14 DIAGNOSIS — Z01419 Encounter for gynecological examination (general) (routine) without abnormal findings: Secondary | ICD-10-CM | POA: Diagnosis not present

## 2013-12-14 DIAGNOSIS — Z1151 Encounter for screening for human papillomavirus (HPV): Secondary | ICD-10-CM | POA: Diagnosis not present

## 2013-12-14 DIAGNOSIS — R87619 Unspecified abnormal cytological findings in specimens from cervix uteri: Secondary | ICD-10-CM | POA: Insufficient documentation

## 2013-12-20 DIAGNOSIS — L089 Local infection of the skin and subcutaneous tissue, unspecified: Secondary | ICD-10-CM | POA: Diagnosis not present

## 2013-12-20 DIAGNOSIS — Z91199 Patient's noncompliance with other medical treatment and regimen due to unspecified reason: Secondary | ICD-10-CM | POA: Diagnosis not present

## 2013-12-20 DIAGNOSIS — F200 Paranoid schizophrenia: Secondary | ICD-10-CM | POA: Diagnosis not present

## 2013-12-20 DIAGNOSIS — Z9119 Patient's noncompliance with other medical treatment and regimen: Secondary | ICD-10-CM | POA: Diagnosis not present

## 2013-12-20 DIAGNOSIS — I10 Essential (primary) hypertension: Secondary | ICD-10-CM | POA: Diagnosis not present

## 2013-12-21 ENCOUNTER — Other Ambulatory Visit: Payer: Self-pay | Admitting: Obstetrics & Gynecology

## 2013-12-21 DIAGNOSIS — N950 Postmenopausal bleeding: Secondary | ICD-10-CM | POA: Diagnosis not present

## 2013-12-21 DIAGNOSIS — Z1231 Encounter for screening mammogram for malignant neoplasm of breast: Secondary | ICD-10-CM

## 2014-01-03 DIAGNOSIS — Z91199 Patient's noncompliance with other medical treatment and regimen due to unspecified reason: Secondary | ICD-10-CM | POA: Diagnosis not present

## 2014-01-03 DIAGNOSIS — L089 Local infection of the skin and subcutaneous tissue, unspecified: Secondary | ICD-10-CM | POA: Diagnosis not present

## 2014-01-03 DIAGNOSIS — I10 Essential (primary) hypertension: Secondary | ICD-10-CM | POA: Diagnosis not present

## 2014-01-03 DIAGNOSIS — F200 Paranoid schizophrenia: Secondary | ICD-10-CM | POA: Diagnosis not present

## 2014-01-03 DIAGNOSIS — Z9119 Patient's noncompliance with other medical treatment and regimen: Secondary | ICD-10-CM | POA: Diagnosis not present

## 2014-01-04 ENCOUNTER — Ambulatory Visit: Payer: Self-pay

## 2014-01-04 DIAGNOSIS — I872 Venous insufficiency (chronic) (peripheral): Secondary | ICD-10-CM | POA: Diagnosis not present

## 2014-01-04 DIAGNOSIS — F319 Bipolar disorder, unspecified: Secondary | ICD-10-CM | POA: Diagnosis not present

## 2014-01-05 ENCOUNTER — Ambulatory Visit
Admission: RE | Admit: 2014-01-05 | Discharge: 2014-01-05 | Disposition: A | Discharge status: Home / Self Care | Payer: Medicare Other | Source: Ambulatory Visit | Attending: Obstetrics & Gynecology | Admitting: Obstetrics & Gynecology

## 2014-01-05 DIAGNOSIS — Z1231 Encounter for screening mammogram for malignant neoplasm of breast: Secondary | ICD-10-CM

## 2014-01-06 DIAGNOSIS — F209 Schizophrenia, unspecified: Secondary | ICD-10-CM | POA: Diagnosis not present

## 2014-01-10 ENCOUNTER — Other Ambulatory Visit: Payer: Self-pay | Admitting: Family Medicine

## 2014-01-10 DIAGNOSIS — I872 Venous insufficiency (chronic) (peripheral): Secondary | ICD-10-CM

## 2014-01-18 DIAGNOSIS — F209 Schizophrenia, unspecified: Secondary | ICD-10-CM | POA: Diagnosis not present

## 2014-01-20 ENCOUNTER — Ambulatory Visit
Admission: RE | Admit: 2014-01-20 | Discharge: 2014-01-20 | Disposition: A | Discharge status: Home / Self Care | Payer: Medicare Other | Source: Ambulatory Visit | Attending: Family Medicine | Admitting: Family Medicine

## 2014-01-20 DIAGNOSIS — L089 Local infection of the skin and subcutaneous tissue, unspecified: Secondary | ICD-10-CM | POA: Diagnosis not present

## 2014-01-20 DIAGNOSIS — I872 Venous insufficiency (chronic) (peripheral): Secondary | ICD-10-CM | POA: Diagnosis not present

## 2014-01-20 DIAGNOSIS — F200 Paranoid schizophrenia: Secondary | ICD-10-CM | POA: Diagnosis not present

## 2014-01-20 DIAGNOSIS — Z91199 Patient's noncompliance with other medical treatment and regimen due to unspecified reason: Secondary | ICD-10-CM | POA: Diagnosis not present

## 2014-01-20 DIAGNOSIS — Z9119 Patient's noncompliance with other medical treatment and regimen: Secondary | ICD-10-CM | POA: Diagnosis not present

## 2014-01-20 DIAGNOSIS — I83893 Varicose veins of bilateral lower extremities with other complications: Secondary | ICD-10-CM | POA: Diagnosis not present

## 2014-01-20 DIAGNOSIS — M7989 Other specified soft tissue disorders: Secondary | ICD-10-CM | POA: Diagnosis not present

## 2014-01-20 DIAGNOSIS — I10 Essential (primary) hypertension: Secondary | ICD-10-CM | POA: Diagnosis not present

## 2014-01-20 HISTORY — DX: Venous insufficiency (chronic) (peripheral): I87.2

## 2014-02-08 DIAGNOSIS — F209 Schizophrenia, unspecified: Secondary | ICD-10-CM | POA: Diagnosis not present

## 2014-02-22 DIAGNOSIS — F209 Schizophrenia, unspecified: Secondary | ICD-10-CM | POA: Diagnosis not present

## 2014-03-09 DIAGNOSIS — F209 Schizophrenia, unspecified: Secondary | ICD-10-CM | POA: Diagnosis not present

## 2014-04-06 DIAGNOSIS — F209 Schizophrenia, unspecified: Secondary | ICD-10-CM | POA: Diagnosis not present

## 2014-04-19 ENCOUNTER — Telehealth: Payer: Self-pay | Admitting: Radiology

## 2014-04-19 NOTE — Telephone Encounter (Signed)
3 month follow up call for status update Conservative Rx of Venous Insufficiency  Per patient's father:  Patient noncompliant re: daily use of graduated compression garment, and will most likely refuse recommended use due to her mental health status.    Family will call back if/when patient agrees/needs follow up.  Abhimanyu Cruces Carmell [place], [place] 04/19/2014 10:17 AM

## 2014-04-27 DIAGNOSIS — F17200 Nicotine dependence, unspecified, uncomplicated: Secondary | ICD-10-CM | POA: Diagnosis not present

## 2014-04-27 DIAGNOSIS — I10 Essential (primary) hypertension: Secondary | ICD-10-CM | POA: Diagnosis not present

## 2014-04-27 DIAGNOSIS — Z23 Encounter for immunization: Secondary | ICD-10-CM | POA: Diagnosis not present

## 2014-04-27 DIAGNOSIS — I8310 Varicose veins of unspecified lower extremity with inflammation: Secondary | ICD-10-CM | POA: Diagnosis not present

## 2014-04-27 DIAGNOSIS — E871 Hypo-osmolality and hyponatremia: Secondary | ICD-10-CM | POA: Diagnosis not present

## 2014-05-04 DIAGNOSIS — F209 Schizophrenia, unspecified: Secondary | ICD-10-CM | POA: Diagnosis not present

## 2014-05-09 DIAGNOSIS — Z9109 Other allergy status, other than to drugs and biological substances: Secondary | ICD-10-CM | POA: Diagnosis not present

## 2014-05-09 DIAGNOSIS — L03115 Cellulitis of right lower limb: Secondary | ICD-10-CM | POA: Diagnosis not present

## 2014-05-09 DIAGNOSIS — R062 Wheezing: Secondary | ICD-10-CM | POA: Diagnosis not present

## 2014-05-09 DIAGNOSIS — F17200 Nicotine dependence, unspecified, uncomplicated: Secondary | ICD-10-CM | POA: Diagnosis not present

## 2014-05-13 DIAGNOSIS — L03115 Cellulitis of right lower limb: Secondary | ICD-10-CM | POA: Diagnosis not present

## 2014-05-25 DIAGNOSIS — I8310 Varicose veins of unspecified lower extremity with inflammation: Secondary | ICD-10-CM | POA: Diagnosis not present

## 2014-06-01 DIAGNOSIS — F209 Schizophrenia, unspecified: Secondary | ICD-10-CM | POA: Diagnosis not present

## 2014-06-28 DIAGNOSIS — F209 Schizophrenia, unspecified: Secondary | ICD-10-CM | POA: Diagnosis not present

## 2014-07-26 DIAGNOSIS — F209 Schizophrenia, unspecified: Secondary | ICD-10-CM | POA: Diagnosis not present

## 2014-07-28 IMAGING — MG MM DIGITAL SCREENING BILAT W/ CAD
4 series · 4 of 4 positions shown · non-contrast
Comparison: Previous exam(s).

CLINICAL DATA: Screening.

EXAM:
DIGITAL SCREENING BILATERAL MAMMOGRAM WITH CAD

[R CC]
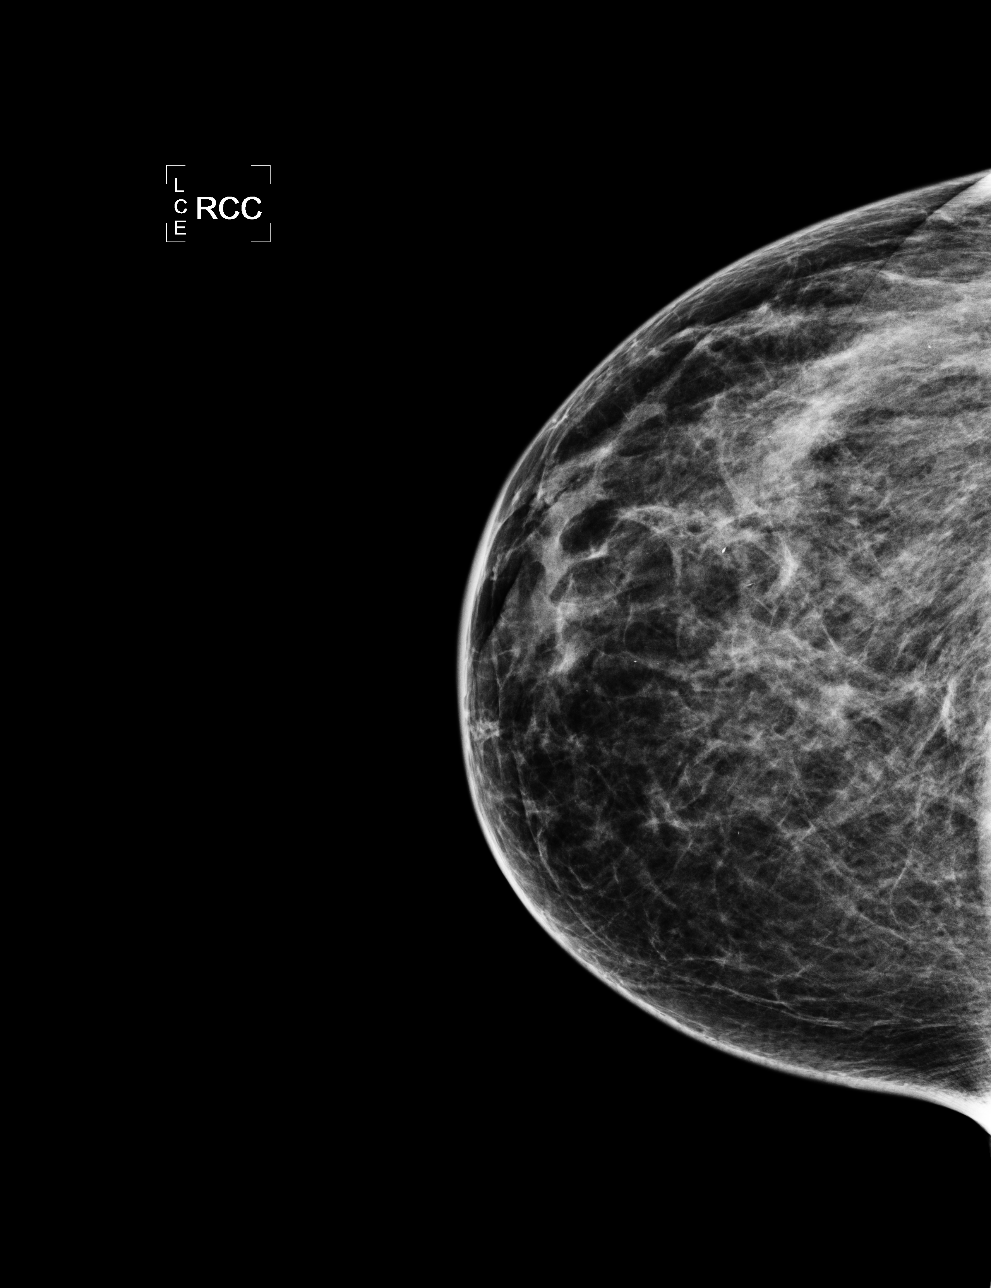

[L CC]
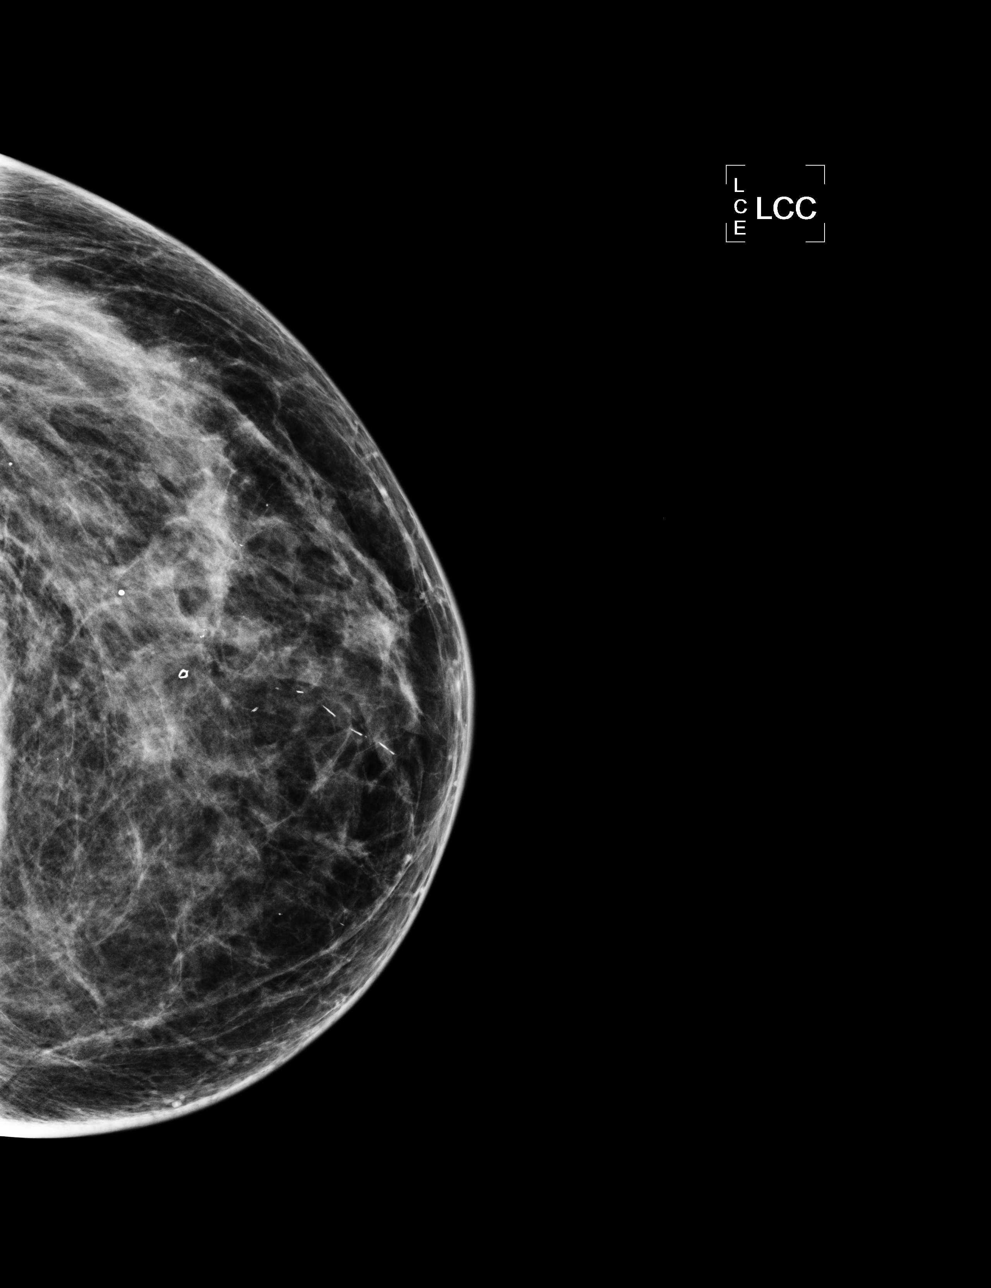

[L MLO]
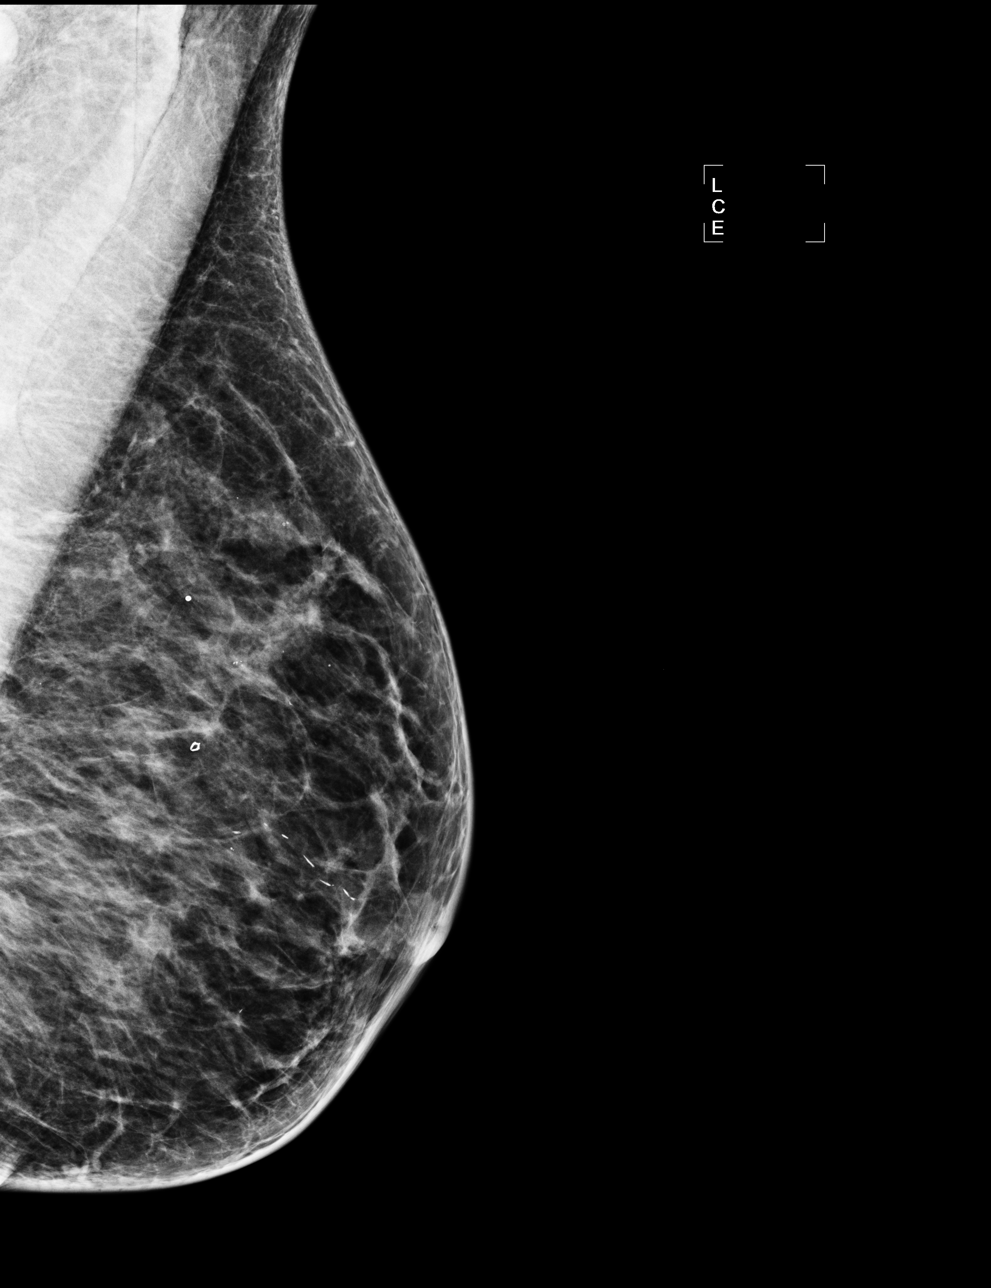

[R MLO]
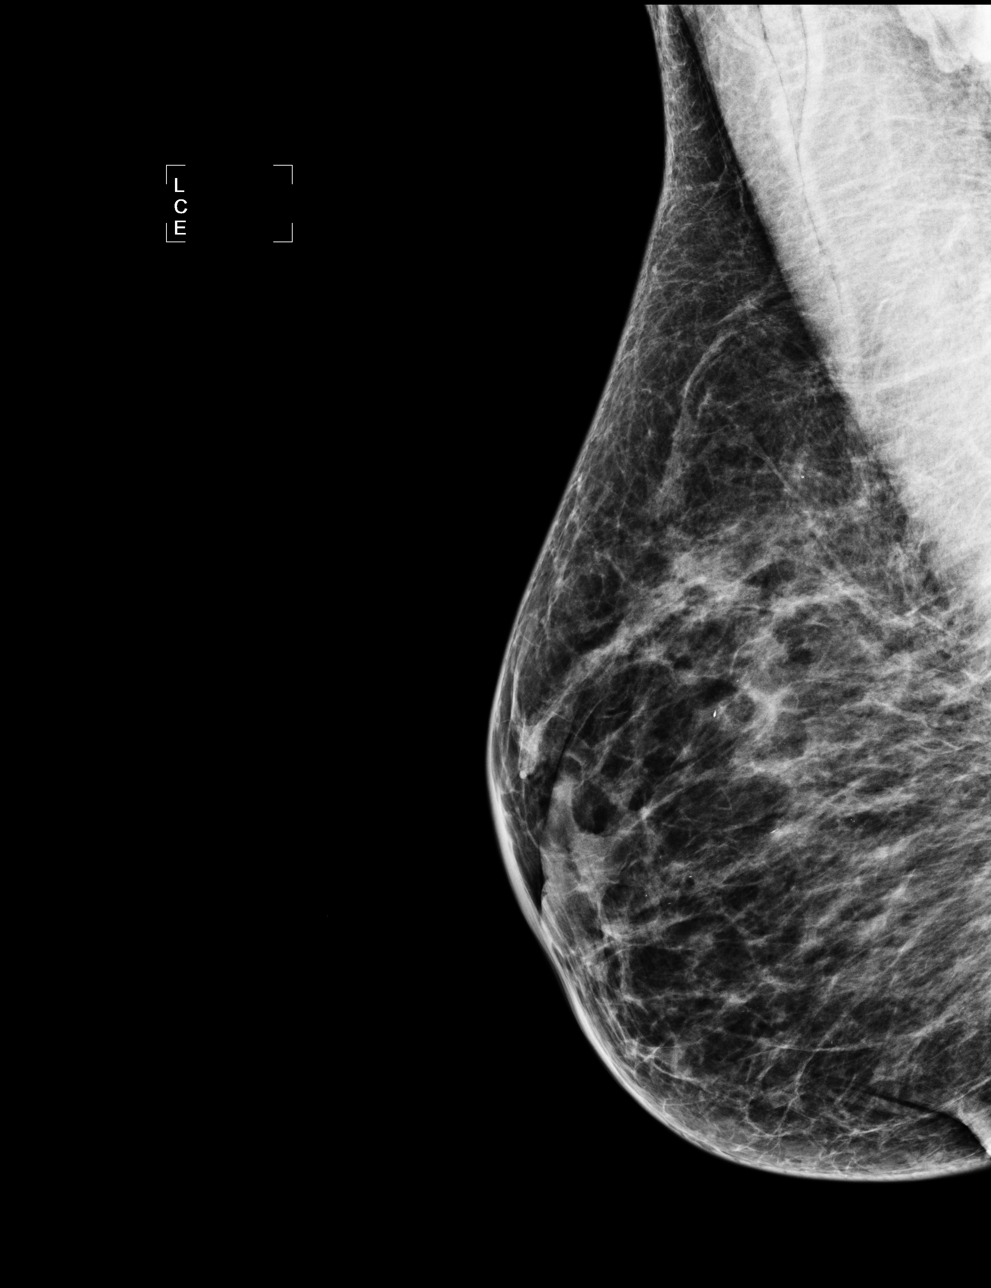

[4 of 4 positions shown; findings below may reference images not displayed]

ACR Breast Density Category b: There are scattered areas of
fibroglandular density.
FINDINGS: There are no findings suspicious for malignancy. Images were
processed with CAD.
IMPRESSION: No mammographic evidence of malignancy. A result letter of this
screening mammogram will be mailed directly to the patient.

RECOMMENDATION:
Screening mammogram in one year. (Code:AS-G-LCT)

BI-RADS CATEGORY  1: Negative.

## 2014-08-16 DIAGNOSIS — F209 Schizophrenia, unspecified: Secondary | ICD-10-CM | POA: Diagnosis not present

## 2014-08-19 ENCOUNTER — Encounter (HOSPITAL_COMMUNITY): Payer: Self-pay | Admitting: Physical Medicine and Rehabilitation

## 2014-08-19 ENCOUNTER — Emergency Department (HOSPITAL_COMMUNITY)
Admission: EM | Admit: 2014-08-19 | Discharge: 2014-08-19 | Disposition: A | Discharge status: Home / Self Care | Payer: Medicare Other | Attending: Emergency Medicine | Admitting: Emergency Medicine

## 2014-08-19 DIAGNOSIS — Z8679 Personal history of other diseases of the circulatory system: Secondary | ICD-10-CM | POA: Insufficient documentation

## 2014-08-19 DIAGNOSIS — X58XXXA Exposure to other specified factors, initial encounter: Secondary | ICD-10-CM | POA: Diagnosis not present

## 2014-08-19 DIAGNOSIS — T464X1A Poisoning by angiotensin-converting-enzyme inhibitors, accidental (unintentional), initial encounter: Secondary | ICD-10-CM | POA: Diagnosis not present

## 2014-08-19 DIAGNOSIS — Y998 Other external cause status: Secondary | ICD-10-CM | POA: Diagnosis not present

## 2014-08-19 DIAGNOSIS — F209 Schizophrenia, unspecified: Secondary | ICD-10-CM | POA: Insufficient documentation

## 2014-08-19 DIAGNOSIS — I10 Essential (primary) hypertension: Secondary | ICD-10-CM | POA: Diagnosis not present

## 2014-08-19 DIAGNOSIS — L0390 Cellulitis, unspecified: Secondary | ICD-10-CM | POA: Diagnosis not present

## 2014-08-19 DIAGNOSIS — T447X1A Poisoning by beta-adrenoreceptor antagonists, accidental (unintentional), initial encounter: Secondary | ICD-10-CM | POA: Insufficient documentation

## 2014-08-19 DIAGNOSIS — Z0389 Encounter for observation for other suspected diseases and conditions ruled out: Secondary | ICD-10-CM | POA: Diagnosis not present

## 2014-08-19 DIAGNOSIS — Z79899 Other long term (current) drug therapy: Secondary | ICD-10-CM | POA: Diagnosis not present

## 2014-08-19 DIAGNOSIS — Y9389 Activity, other specified: Secondary | ICD-10-CM | POA: Diagnosis not present

## 2014-08-19 DIAGNOSIS — Z720 Tobacco use: Secondary | ICD-10-CM | POA: Diagnosis not present

## 2014-08-19 DIAGNOSIS — Y9289 Other specified places as the place of occurrence of the external cause: Secondary | ICD-10-CM | POA: Diagnosis not present

## 2014-08-19 DIAGNOSIS — T50901A Poisoning by unspecified drugs, medicaments and biological substances, accidental (unintentional), initial encounter: Secondary | ICD-10-CM

## 2014-08-19 LAB — COMPREHENSIVE METABOLIC PANEL
ALK PHOS: 85 U/L (ref 39–117)
ALT: 34 U/L (ref 0–35)
ANION GAP: 12 (ref 5–15)
AST: 31 U/L (ref 0–37)
Albumin: 3.9 g/dL (ref 3.5–5.2)
BILIRUBIN TOTAL: 0.4 mg/dL (ref 0.3–1.2)
BUN: 16 mg/dL (ref 6–23)
CALCIUM: 9.5 mg/dL (ref 8.4–10.5)
CHLORIDE: 94 meq/L — AB (ref 96–112)
CO2: 25 mmol/L (ref 19–32)
Creatinine, Ser: 0.55 mg/dL (ref 0.50–1.10)
GFR calc non Af Amer: >90 mL/min (ref >90)
Glucose, Bld: 106 mg/dL — ABNORMAL HIGH (ref 70–99)
POTASSIUM: 4.2 mmol/L (ref 3.5–5.1)
Sodium: 131 mmol/L — ABNORMAL LOW (ref 135–145)
Total Protein: 7 g/dL (ref 6.0–8.3)

## 2014-08-19 LAB — CBC WITH DIFFERENTIAL/PLATELET
BASOS ABS: 0 10*3/uL (ref 0.0–0.1)
Basophils Relative: 1 % (ref 0–1)
EOS PCT: 2 % (ref 0–5)
Eosinophils Absolute: 0.1 10*3/uL (ref 0.0–0.7)
HEMATOCRIT: 43.2 % (ref 36.0–46.0)
Hemoglobin: 15.4 g/dL — ABNORMAL HIGH (ref 12.0–15.0)
LYMPHS ABS: 1.9 10*3/uL (ref 0.7–4.0)
Lymphocytes Relative: 27 % (ref 12–46)
MCH: 30.5 pg (ref 26.0–34.0)
MCHC: 35.6 g/dL (ref 30.0–36.0)
MCV: 85.5 fL (ref 78.0–100.0)
MONOS PCT: 8 % (ref 3–12)
Monocytes Absolute: 0.6 10*3/uL (ref 0.1–1.0)
NEUTROS ABS: 4.6 10*3/uL (ref 1.7–7.7)
Neutrophils Relative %: 62 % (ref 43–77)
PLATELETS: 356 10*3/uL (ref 150–400)
RBC: 5.05 MIL/uL (ref 3.87–5.11)
RDW: 13.5 % (ref 11.5–15.5)
WBC: 7.3 10*3/uL (ref 4.0–10.5)

## 2014-08-19 NOTE — ED Notes (Signed)
Pt. Ambulated approx 500 feet in the hallway independently. Denies SOB/N/V/Dizziness.

## 2014-08-19 NOTE — Discharge Instructions (Signed)
Accidental Overdose  A drug overdose occurs when a chemical substance (drug or medication) is used in amounts large enough to overcome a person. This may result in severe illness or death. This is a type of poisoning. Accidental overdoses of medications or other substances come from a variety of reasons. When this happens accidentally, it is often because the person taking the substance does not know enough about what they have taken. Drugs which commonly cause overdose deaths are alcohol, psychotropic medications (medications which affect the mind), pain medications, illegal drugs (street drugs) such as cocaine and heroin, and multiple drugs taken at the same time. It may result from careless behavior (such as over-indulging at a party). Other causes of overdose may include multiple drug use, a lapse in memory, or drug use after a period of no drug use.   Sometimes overdosing occurs because a person cannot remember if they have taken their medication.   A common unintentional overdose in young children involves multi-vitamins containing iron. Iron is a part of the hemoglobin molecule in blood. It is used to transport oxygen to living cells. When taken in small amounts, iron allows the body to restock hemoglobin. In large amounts, it causes problems in the body. If this overdose is not treated, it can lead to death.  Never take medicines that show signs of tampering or do not seem quite right. Never take medicines in the dark or in poor lighting. Read the label and check each dose of medicine before you take it. When adults are poisoned, it happens most often through carelessness or lack of information. Taking medicines in the dark or taking medicine prescribed for someone else to treat the same type of problem is a dangerous practice.  SYMPTOMS   Symptoms of overdose depend on the medication and amount taken. They can vary from over-activity with stimulant over-dosage, to sleepiness from depressants such as  alcohol, narcotics and tranquilizers. Confusion, dizziness, nausea and vomiting may be present. If problems are severe enough coma and death may result.  DIAGNOSIS   Diagnosis and management are generally straightforward if the drug is known. Otherwise it is more difficult. At times, certain symptoms and signs exhibited by the patient, or blood tests, can reveal the drug in question.   TREATMENT   In an emergency department, most patients can be treated with supportive measures. Antidotes may be available if there has been an overdose of opioids or benzodiazepines. A rapid improvement will often occur if this is the cause of overdose.  At home or away from medical care:   There may be no immediate problems or warning signs in children.   Not everything works well in all cases of poisoning.   Take immediate action. Poisons may act quickly.   If you think someone has swallowed medicine or a household product, and the person is unconscious, having seizures (convulsions), or is not breathing, immediately call for an ambulance.  IF a person is conscious and appears to be doing OK but has swallowed a poison:   Do not wait to see what effect the poison will have. Immediately call a poison control center (listed in the white pages of your telephone book under "Poison Control" or inside the front cover with other emergency numbers). Some poison control centers have TTY capability for the deaf. Check with your local center if you or someone in your family requires this service.   Keep the container so you can read the label on the product for ingredients.     Describe what, when, and how much was taken and the age and condition of the person poisoned. Inform them if the person is vomiting, choking, drowsy, shows a change in color or temperature of skin, is conscious or unconscious, or is convulsing.   Do not cause vomiting unless instructed by medical personnel. Do not induce vomiting or force liquids into a person who  is convulsing, unconscious, or very drowsy.  Stay calm and in control.    Activated charcoal also is sometimes used in certain types of poisoning and you may wish to add a supply to your emergency medicines. It is available without a prescription. Call a poison control center before using this medication.  PREVENTION   Thousands of children die every year from unintentional poisoning. This may be from household chemicals, poisoning from carbon monoxide in a car, taking their parent's medications, or simply taking a few iron pills or vitamins with iron. Poisoning comes from unexpected sources.   Store medicines out of the sight and reach of children, preferably in a locked cabinet. Do not keep medications in a food cabinet. Always store your medicines in a secure place. Get rid of expired medications.   If you have children living with you or have them as occasional guests, you should have child-resistant caps on your medicine containers. Keep everything out of reach. Child proof your home.   If you are called to the telephone or to answer the door while you are taking a medicine, take the container with you or put the medicine out of the reach of small children.   Do not take your medication in front of children. Do not tell your child how good a medication is and how good it is for them. They may get the idea it is more of a treat.   If you are an adult and have accidentally taken an overdose, you need to consider how this happened and what can be done to prevent it from happening again. If this was from a street drug or alcohol, determine if there is a problem that needs addressing. If you are not sure a problems exists, it is easy to talk to a professional and ask them if they think you have a problem. It is better to handle this problem in this way before it happens again and has a much worse consequence.  Document Released: 10/05/2004 Document Revised: 10/14/2011 Document Reviewed: 03/13/2009  ExitCare  Patient Information 2015 [employer], [employer]. This information is not intended to replace advice given to you by your health care provider. Make sure you discuss any questions you have with your health care provider.

## 2014-08-19 NOTE — ED Notes (Signed)
Pt. Informed of need for urine sample. Pt. States she just used restroom in lobby and will notify when needs to urinate.

## 2014-08-19 NOTE — ED Provider Notes (Addendum)
CSN: [id number]     Arrival date & time 08/19/14  1328 History   First MD Initiated Contact with Patient 08/19/14 1540     Chief Complaint  Patient presents with  . Drug Overdose     (Consider location/radiation/quality/duration/timing/severity/associated sxs/prior Treatment) HPI Comments: Patient is a paranoid schizophrenic who lives in a partially independent facility. Her family is responsible for ensuring she takes her medications daily and last night her dad dropped off 3 days worth of medications.  However patient is not reliable when it comes to taking medications and she took all of the medications sometime between 5 PM and 10 AM this morning. Denies suicidal ideation and person from the facility who is in oversee her states that this is typical if she has medication available she will just take it. Patient wanted saw her initial Dr. who sent her here for further evaluation. Patient currently has no complaints.  Secondly today they have noticed patient's right lower extremity is more red than normal. She does a lot of walking and does not wear compression hose. She denies any pain in the leg. No fevers or shortness of breath.  The history is provided by the patient and a caregiver.    Past Medical History  Diagnosis Date  . Paranoid schizophrenia   . Venous insufficiency of right lower extremity    History reviewed. No pertinent past surgical history. No family history on file. History  Substance Use Topics  . Smoking status: Current Every Day Smoker -- 1.00 packs/day    Types: Cigarettes  . Smokeless tobacco: Not on file  . Alcohol Use: No   OB History    No data available     Review of Systems  All other systems reviewed and are negative.     Allergies  Phosphate; Artane; and Codeine  Home Medications   Prior to Admission medications   Medication Sig Start Date End Date Taking? Authorizing Provider  amLODipine (NORVASC) 10 MG tablet Take 10 mg by mouth daily.     Historical Provider, MD  ARIPiprazole (ABILIFY) 10 MG tablet Take 10 mg by mouth daily.    Historical Provider, MD  benztropine (COGENTIN) 1 MG tablet Take 1 mg by mouth daily.    Historical Provider, MD  metoprolol (LOPRESSOR) 50 MG tablet Take 50 mg by mouth 2 (two) times daily.    Historical Provider, MD  risperiDONE (RISPERDAL) 3 MG tablet Take 3 mg by mouth 2 (two) times daily.    Historical Provider, MD   BP 119/73 mmHg  Pulse 85  Temp(Src) 98.8 F (37.1 C) (Oral)  Resp 15  Ht 5\' 6"  (1.676 m)  Wt 135 lb (61.236 kg)  BMI 21.80 kg/m2  SpO2 92%  LMP 12/29/2013 Physical Exam  Constitutional: She is oriented to person, place, and time. She appears well-developed and well-nourished. No distress.  HENT:  Head: Normocephalic and atraumatic.  Mouth/Throat: Oropharynx is clear and moist.  Eyes: Conjunctivae and EOM are normal. Pupils are equal, round, and reactive to light.  Neck: Normal range of motion. Neck supple.  Cardiovascular: Normal rate, regular rhythm and intact distal pulses.   No murmur heard. Pulmonary/Chest: Effort normal and breath sounds normal. No respiratory distress. She has no wheezes. She has no rales.  Abdominal: Soft. She exhibits no distension. There is no tenderness. There is no rebound and no guarding.  Musculoskeletal: Normal range of motion. She exhibits no edema or tenderness.  Neurological: She is alert and oriented to person, place, and  time.  Difficult to understand speech  Skin: Skin is warm and dry. No rash noted. There is erythema.  Venous insufficiency in the right lower extremity with erythema. 1+ pitting edema. Multiple varicose veins. Minimal warmth and no induration or fluctuance.  Bilateral hands are erythematous but nontender and not swollen  Psychiatric: She has a normal mood and affect. Her behavior is normal.  Nursing note and vitals reviewed.   ED Course  Procedures (including critical care time) Labs Review Labs Reviewed  CBC  WITH DIFFERENTIAL - Abnormal; Notable for the following:    Hemoglobin 15.4 (*)    All other components within normal limits  COMPREHENSIVE METABOLIC PANEL - Abnormal; Notable for the following:    Sodium 131 (*)    Chloride 94 (*)    Glucose, Bld 106 (*)    All other components within normal limits  URINALYSIS, ROUTINE W REFLEX MICROSCOPIC    Imaging Review No results found.   EKG Interpretation   Date/Time:  Friday August 19 2014 16:17:06 EST Ventricular Rate:  83 PR Interval:  138 QRS Duration: 82 QT Interval:  377 QTC Calculation: 443 R Axis:   91 Text Interpretation:  Sinus rhythm Borderline right axis deviation No  significant change since last tracing Confirmed by Anitra Lauth  MD, Alphonzo Lemmings  [phone]) on 08/19/2014 4:36:58 PM      MDM   Final diagnoses:  Accidental overdose, initial encounter    Patient presents with accidental medication overdose. She tells approximately 30 mg of amlodipine and 150 mg of metoprolol. She has a history of schizophrenia and cannot reliably take medications. Her family dropped off 3 days worth yesterday instead of getting her only one days worth and locking them up. She had no medications left at her home this morning. She must have taken the medication sometime between 5 PM and 10 AM this morning. It is now 5 hours past 10 AM and patient has no evidence of hypotension or bradycardia. She has no complaints at this time. Patient's blood pressure has remained within normal limits.  Discussed patient's case with Judeth Cornfield from poison control who is states that typically they need to be observed for 6 hours postingestion. Also patient needs to ambulate to ensure that she does not become dizzy.  We'll monitor the patient for an hour and ensure that her blood pressure remained stable. Also ambulate patient to ensure she is asymptomatic. Otherwise she will be okay to be discharged back to her facility. There are no other medications there that would cause  her to be a harm to herself. This was not done as an active suicide.  Secondly patient has a history of venous insufficiency of her right lower extremity and today it is swollen and red. Her PCP felt that this could be cellulitis and prescribed Keflex. Patient does not have a fever the area is minimally warm with evidence of significant varicosities. Feel that by mouth Keflex is reasonable also could just be worsening venous insufficiency as patient walks a lot and does not wear compression stockings.   4:37 PM Patient's blood pressure remained stable. Heart rate remains in the 80s. Patient has no dizziness upon standing and walking. Will discharge home   Gwyneth Sprout, MD 08/19/14 1637  Gwyneth Sprout, MD 08/19/14 1657

## 2014-08-19 NOTE — ED Notes (Signed)
[facility] medication overdose. Pt has schizophrenia, family monitors medications, pt accidentally ingested x3 days worth of home medications (amlopidine 10mg  x3, metoprolol 25mg  x6, primary care physician concerned about possible hypotension. Pt is alert upon arrival, friend at bedside states she is at normal neurological baseline.

## 2014-08-23 DIAGNOSIS — F209 Schizophrenia, unspecified: Secondary | ICD-10-CM | POA: Diagnosis not present

## 2014-08-31 DIAGNOSIS — I8311 Varicose veins of right lower extremity with inflammation: Secondary | ICD-10-CM | POA: Diagnosis not present

## 2014-09-13 ENCOUNTER — Telehealth: Payer: Self-pay | Admitting: Radiology

## 2014-09-13 NOTE — Telephone Encounter (Signed)
Patient's father called to request Rx for compression hose and brochure for [employer].  Mailed requested info.  Regnald Bowens Carmell AustriaGales, RN 09/13/2014 9:41 AM

## 2014-09-22 DIAGNOSIS — F209 Schizophrenia, unspecified: Secondary | ICD-10-CM | POA: Diagnosis not present

## 2014-09-28 DIAGNOSIS — I8311 Varicose veins of right lower extremity with inflammation: Secondary | ICD-10-CM | POA: Diagnosis not present

## 2014-10-14 ENCOUNTER — Encounter (HOSPITAL_COMMUNITY): Payer: Self-pay

## 2014-10-14 ENCOUNTER — Emergency Department (INDEPENDENT_AMBULATORY_CARE_PROVIDER_SITE_OTHER): Payer: Medicare Other

## 2014-10-14 ENCOUNTER — Emergency Department (HOSPITAL_COMMUNITY): Payer: Medicare Other

## 2014-10-14 ENCOUNTER — Emergency Department (INDEPENDENT_AMBULATORY_CARE_PROVIDER_SITE_OTHER)
Admission: EM | Admit: 2014-10-14 | Discharge: 2014-10-14 | Disposition: A | Discharge status: Other Acute Inpatient Hospital | Payer: Medicare Other | Source: Home / Self Care | Attending: Family Medicine | Admitting: Family Medicine

## 2014-10-14 ENCOUNTER — Inpatient Hospital Stay (HOSPITAL_COMMUNITY)
Admission: EM | Admit: 2014-10-14 | Discharge: 2014-10-21 | DRG: 176 | Disposition: A | Discharge status: Skilled Nursing Facility | Payer: Medicare Other | Attending: Internal Medicine | Admitting: Internal Medicine

## 2014-10-14 ENCOUNTER — Encounter (HOSPITAL_COMMUNITY): Payer: Self-pay | Admitting: Cardiology

## 2014-10-14 ENCOUNTER — Other Ambulatory Visit (HOSPITAL_COMMUNITY): Payer: Self-pay

## 2014-10-14 DIAGNOSIS — G4700 Insomnia, unspecified: Secondary | ICD-10-CM | POA: Diagnosis not present

## 2014-10-14 DIAGNOSIS — I517 Cardiomegaly: Secondary | ICD-10-CM | POA: Diagnosis not present

## 2014-10-14 DIAGNOSIS — Z888 Allergy status to other drugs, medicaments and biological substances status: Secondary | ICD-10-CM | POA: Diagnosis not present

## 2014-10-14 DIAGNOSIS — I429 Cardiomyopathy, unspecified: Secondary | ICD-10-CM | POA: Diagnosis present

## 2014-10-14 DIAGNOSIS — E871 Hypo-osmolality and hyponatremia: Secondary | ICD-10-CM | POA: Diagnosis not present

## 2014-10-14 DIAGNOSIS — K922 Gastrointestinal hemorrhage, unspecified: Secondary | ICD-10-CM | POA: Diagnosis not present

## 2014-10-14 DIAGNOSIS — I872 Venous insufficiency (chronic) (peripheral): Secondary | ICD-10-CM | POA: Diagnosis present

## 2014-10-14 DIAGNOSIS — Z885 Allergy status to narcotic agent status: Secondary | ICD-10-CM

## 2014-10-14 DIAGNOSIS — R410 Disorientation, unspecified: Secondary | ICD-10-CM | POA: Diagnosis not present

## 2014-10-14 DIAGNOSIS — I270 Primary pulmonary hypertension: Secondary | ICD-10-CM | POA: Diagnosis not present

## 2014-10-14 DIAGNOSIS — Z79899 Other long term (current) drug therapy: Secondary | ICD-10-CM | POA: Diagnosis not present

## 2014-10-14 DIAGNOSIS — I10 Essential (primary) hypertension: Secondary | ICD-10-CM | POA: Diagnosis present

## 2014-10-14 DIAGNOSIS — I288 Other diseases of pulmonary vessels: Secondary | ICD-10-CM | POA: Diagnosis not present

## 2014-10-14 DIAGNOSIS — F17210 Nicotine dependence, cigarettes, uncomplicated: Secondary | ICD-10-CM | POA: Diagnosis present

## 2014-10-14 DIAGNOSIS — F209 Schizophrenia, unspecified: Secondary | ICD-10-CM | POA: Diagnosis present

## 2014-10-14 DIAGNOSIS — F419 Anxiety disorder, unspecified: Secondary | ICD-10-CM | POA: Diagnosis not present

## 2014-10-14 DIAGNOSIS — J45909 Unspecified asthma, uncomplicated: Secondary | ICD-10-CM | POA: Diagnosis not present

## 2014-10-14 DIAGNOSIS — E876 Hypokalemia: Secondary | ICD-10-CM | POA: Diagnosis present

## 2014-10-14 DIAGNOSIS — E222 Syndrome of inappropriate secretion of antidiuretic hormone: Secondary | ICD-10-CM | POA: Diagnosis not present

## 2014-10-14 DIAGNOSIS — K228 Other specified diseases of esophagus: Secondary | ICD-10-CM | POA: Diagnosis present

## 2014-10-14 DIAGNOSIS — F200 Paranoid schizophrenia: Secondary | ICD-10-CM | POA: Diagnosis not present

## 2014-10-14 DIAGNOSIS — I509 Heart failure, unspecified: Secondary | ICD-10-CM | POA: Diagnosis present

## 2014-10-14 DIAGNOSIS — I2699 Other pulmonary embolism without acute cor pulmonale: Secondary | ICD-10-CM | POA: Diagnosis not present

## 2014-10-14 DIAGNOSIS — R0602 Shortness of breath: Secondary | ICD-10-CM | POA: Diagnosis not present

## 2014-10-14 DIAGNOSIS — F54 Psychological and behavioral factors associated with disorders or diseases classified elsewhere: Secondary | ICD-10-CM

## 2014-10-14 DIAGNOSIS — I272 Other secondary pulmonary hypertension: Secondary | ICD-10-CM | POA: Diagnosis present

## 2014-10-14 DIAGNOSIS — K449 Diaphragmatic hernia without obstruction or gangrene: Secondary | ICD-10-CM | POA: Diagnosis present

## 2014-10-14 DIAGNOSIS — Z9114 Patient's other noncompliance with medication regimen: Secondary | ICD-10-CM | POA: Diagnosis not present

## 2014-10-14 DIAGNOSIS — I878 Other specified disorders of veins: Secondary | ICD-10-CM | POA: Diagnosis present

## 2014-10-14 DIAGNOSIS — F250 Schizoaffective disorder, bipolar type: Secondary | ICD-10-CM | POA: Diagnosis not present

## 2014-10-14 DIAGNOSIS — R631 Polydipsia: Secondary | ICD-10-CM | POA: Diagnosis present

## 2014-10-14 DIAGNOSIS — R0609 Other forms of dyspnea: Secondary | ICD-10-CM | POA: Diagnosis not present

## 2014-10-14 DIAGNOSIS — E042 Nontoxic multinodular goiter: Secondary | ICD-10-CM | POA: Diagnosis present

## 2014-10-14 DIAGNOSIS — R42 Dizziness and giddiness: Secondary | ICD-10-CM | POA: Diagnosis not present

## 2014-10-14 HISTORY — DX: Essential (primary) hypertension: I10

## 2014-10-14 LAB — BASIC METABOLIC PANEL
Anion gap: 10 (ref 5–15)
Anion gap: 10 (ref 5–15)
BUN: <5 mg/dL — ABNORMAL LOW (ref 6–23)
CHLORIDE: 74 mmol/L — AB (ref 96–112)
CO2: 27 mmol/L (ref 19–32)
CO2: 29 mmol/L (ref 19–32)
CREATININE: 0.36 mg/dL — AB (ref 0.50–1.10)
Calcium: 8.5 mg/dL (ref 8.4–10.5)
Calcium: 8.8 mg/dL (ref 8.4–10.5)
Chloride: 73 mmol/L — ABNORMAL LOW (ref 96–112)
Creatinine, Ser: 0.38 mg/dL — ABNORMAL LOW (ref 0.50–1.10)
GFR calc Af Amer: >90 mL/min (ref >90)
GFR calc Af Amer: >90 mL/min (ref >90)
GFR calc non Af Amer: >90 mL/min (ref >90)
GFR calc non Af Amer: >90 mL/min (ref >90)
GLUCOSE: 100 mg/dL — AB (ref 70–99)
Glucose, Bld: 115 mg/dL — ABNORMAL HIGH (ref 70–99)
POTASSIUM: 4.1 mmol/L (ref 3.5–5.1)
POTASSIUM: 4 mmol/L (ref 3.5–5.1)
Sodium: 110 mmol/L — CL (ref 135–145)
Sodium: 113 mmol/L — CL (ref 135–145)

## 2014-10-14 LAB — HEPATIC FUNCTION PANEL
ALT: 67 U/L — ABNORMAL HIGH (ref 0–35)
AST: 47 U/L — ABNORMAL HIGH (ref 0–37)
Albumin: 3.7 g/dL (ref 3.5–5.2)
Alkaline Phosphatase: 113 U/L (ref 39–117)
BILIRUBIN DIRECT: 0.1 mg/dL (ref 0.0–0.5)
BILIRUBIN TOTAL: 0.8 mg/dL (ref 0.3–1.2)
Indirect Bilirubin: 0.7 mg/dL (ref 0.3–0.9)
TOTAL PROTEIN: 6.5 g/dL (ref 6.0–8.3)

## 2014-10-14 LAB — CBC
HCT: 40.8 % (ref 36.0–46.0)
Hemoglobin: 14.6 g/dL (ref 12.0–15.0)
MCH: 29.9 pg (ref 26.0–34.0)
MCHC: 35.8 g/dL (ref 30.0–36.0)
MCV: 83.4 fL (ref 78.0–100.0)
PLATELETS: 360 10*3/uL (ref 150–400)
RBC: 4.89 MIL/uL (ref 3.87–5.11)
RDW: 12.8 % (ref 11.5–15.5)
WBC: 10.1 10*3/uL (ref 4.0–10.5)

## 2014-10-14 LAB — TROPONIN I
Troponin I: <0.03 ng/mL (ref <0.031)
Troponin I: <0.03 ng/mL (ref <0.031)

## 2014-10-14 LAB — SODIUM, URINE, RANDOM: Sodium, Ur: 20 mmol/L

## 2014-10-14 LAB — POC OCCULT BLOOD, ED: Fecal Occult Bld: NEGATIVE

## 2014-10-14 LAB — D-DIMER, QUANTITATIVE (NOT AT ARMC): D DIMER QUANT: 0.66 ug{FEU}/mL — AB (ref 0.00–0.48)

## 2014-10-14 LAB — MRSA PCR SCREENING: MRSA by PCR: NEGATIVE

## 2014-10-14 LAB — URIC ACID: URIC ACID, SERUM: 2.4 mg/dL (ref 2.4–7.0)

## 2014-10-14 LAB — BRAIN NATRIURETIC PEPTIDE: B Natriuretic Peptide: 88 pg/mL (ref 0.0–100.0)

## 2014-10-14 LAB — LIPASE, BLOOD: Lipase: 39 U/L (ref 11–59)

## 2014-10-14 LAB — TSH: TSH: 0.958 u[IU]/mL (ref 0.350–4.500)

## 2014-10-14 MED ORDER — METOPROLOL TARTRATE 25 MG PO TABS
25.0000 mg | ORAL_TABLET | Freq: Two times a day (BID) | ORAL | Status: DC
Start: 2014-10-14 — End: 2014-10-16
  Administered 2014-10-14 – 2014-10-16 (×4): 25 mg via ORAL
  Filled 2014-10-14 (×5): qty 1

## 2014-10-14 MED ORDER — ACETAMINOPHEN 650 MG RE SUPP: 650.0000 mg | Freq: Four times a day (QID) | RECTAL | Status: DC | PRN

## 2014-10-14 MED ORDER — ALBUTEROL SULFATE (2.5 MG/3ML) 0.083% IN NEBU
INHALATION_SOLUTION | RESPIRATORY_TRACT | Status: AC
  Filled 2014-10-14: qty 6

## 2014-10-14 MED ORDER — ALBUTEROL SULFATE (2.5 MG/3ML) 0.083% IN NEBU
5.0000 mg | INHALATION_SOLUTION | Freq: Once | RESPIRATORY_TRACT | Status: AC
  Administered 2014-10-14: 5 mg via RESPIRATORY_TRACT

## 2014-10-14 MED ORDER — METHYLPREDNISOLONE ACETATE 40 MG/ML IJ SUSP
80.0000 mg | Freq: Once | INTRAMUSCULAR | Status: AC
  Administered 2014-10-14: 80 mg via INTRAMUSCULAR

## 2014-10-14 MED ORDER — HYDRALAZINE HCL 20 MG/ML IJ SOLN: 10.0000 mg | INTRAMUSCULAR | Status: DC | PRN

## 2014-10-14 MED ORDER — IPRATROPIUM BROMIDE 0.02 % IN SOLN
0.5000 mg | Freq: Once | RESPIRATORY_TRACT | Status: AC
  Administered 2014-10-14: 0.5 mg via RESPIRATORY_TRACT

## 2014-10-14 MED ORDER — SODIUM CHLORIDE 0.9 % IJ SOLN
3.0000 mL | Freq: Two times a day (BID) | INTRAMUSCULAR | Status: DC
  Administered 2014-10-14 – 2014-10-20 (×12): 3 mL via INTRAVENOUS

## 2014-10-14 MED ORDER — ONDANSETRON HCL 4 MG PO TABS: 4.0000 mg | ORAL_TABLET | Freq: Four times a day (QID) | ORAL | Status: DC | PRN

## 2014-10-14 MED ORDER — ALBUTEROL SULFATE (2.5 MG/3ML) 0.083% IN NEBU
2.5000 mg | INHALATION_SOLUTION | Freq: Four times a day (QID) | RESPIRATORY_TRACT | Status: DC
  Administered 2014-10-14 – 2014-10-15 (×3): 2.5 mg via RESPIRATORY_TRACT
  Filled 2014-10-14 (×3): qty 3

## 2014-10-14 MED ORDER — ACETAMINOPHEN 325 MG PO TABS: 650.0000 mg | ORAL_TABLET | Freq: Four times a day (QID) | ORAL | Status: DC | PRN

## 2014-10-14 MED ORDER — ARIPIPRAZOLE 9.75 MG/1.3ML IM SOLN: 9.7500 mg | INTRAMUSCULAR | Status: DC

## 2014-10-14 MED ORDER — BENZTROPINE MESYLATE 1 MG PO TABS
1.0000 mg | ORAL_TABLET | Freq: Every day | ORAL | Status: DC
  Administered 2014-10-14 – 2014-10-21 (×8): 1 mg via ORAL
  Filled 2014-10-14 (×8): qty 1

## 2014-10-14 MED ORDER — IPRATROPIUM BROMIDE 0.02 % IN SOLN
RESPIRATORY_TRACT | Status: AC
  Filled 2014-10-14: qty 2.5

## 2014-10-14 MED ORDER — AMLODIPINE BESYLATE 10 MG PO TABS
10.0000 mg | ORAL_TABLET | Freq: Every day | ORAL | Status: DC
  Administered 2014-10-14 – 2014-10-16 (×3): 10 mg via ORAL
  Filled 2014-10-14 (×3): qty 1

## 2014-10-14 MED ORDER — ENOXAPARIN SODIUM 40 MG/0.4ML ~~LOC~~ SOLN
40.0000 mg | SUBCUTANEOUS | Status: DC
  Administered 2014-10-14: 40 mg via SUBCUTANEOUS
  Filled 2014-10-14 (×2): qty 0.4

## 2014-10-14 MED ORDER — ALBUTEROL SULFATE (2.5 MG/3ML) 0.083% IN NEBU: 2.5000 mg | INHALATION_SOLUTION | RESPIRATORY_TRACT | Status: DC | PRN

## 2014-10-14 MED ORDER — ONDANSETRON HCL 4 MG/2ML IJ SOLN
4.0000 mg | Freq: Four times a day (QID) | INTRAMUSCULAR | Status: DC | PRN
  Administered 2014-10-15 (×2): 4 mg via INTRAVENOUS
  Filled 2014-10-14 (×2): qty 2

## 2014-10-14 MED ORDER — METHYLPREDNISOLONE ACETATE 80 MG/ML IJ SUSP
INTRAMUSCULAR | Status: AC
  Filled 2014-10-14: qty 1

## 2014-10-14 NOTE — ED Notes (Signed)
Here w caregiver, who states she is not herself and reportedly became disoriented a started to have a problem getting her breath. Skin flushed, breathing shallow , wheezing bilateral

## 2014-10-14 NOTE — ED Provider Notes (Signed)
Patient presented to the ER with complaints of dizziness. She was seen at urgent care earlier and referred to the ER for further evaluation. Upon arrival to the ER, patient is confused, disheveled, cannot answer questions appropriately.  Face to face Exam: HEENT - PERRLA Lungs - CTAB Heart - RRR, no M/R/G Abd - S/NT/ND Neuro - alert, confused  Plan: Patient with significant psychiatric history refer to the ER for dizziness. She cannot answer questions appropriately upon arrival. Patient is disoriented and disheveled. No focal findings on neurologic exam. Lab work reveals significant hyponatremia, likely causing a lot of her symptoms. This could be from psychiatric medications, cannot rule out free water excess. She does have a history of schizophrenia. Patient reports having had a bowel movement earlier, but her rectal exam was heme-negative. Patient will require hospitalization for further management of acute mental status changes in the setting of significant hyponatremia.   [name], MD 10/14/14 [phone]

## 2014-10-14 NOTE — ED Provider Notes (Signed)
CSN: [id number]     Arrival date & time 10/14/14  1351 History   First MD Initiated Contact with Patient 10/14/14 1546     Chief Complaint  Patient presents with  . Dizziness     (Consider location/radiation/quality/duration/timing/severity/associated sxs/prior Treatment) Patient is a 59 y.o. female presenting with dizziness. The history is provided by the patient and a parent. No language interpreter was used.  Dizziness Ms. Speth is a 59 y.o female who has a history of schizophrenia. History by both patient and father are vague.  According to notes in the chart, her caretaker took her shopping this morning and the patient was short of breath with oxygen stats in the 80's. The patient and father do not know how long the shortness of breath lasted or the severity. After bringing her to the hospital, radiology reports that the patient had a large blood tinged bowel movement while getting a chest xray. The patient had no prior treatment before coming to the hospital. It is unclear whether there was associated symptoms and the patient is incoherent.  Past Medical History  Diagnosis Date  . Paranoid schizophrenia   . Venous insufficiency of right lower extremity   . Hypertension    History reviewed. No pertinent past surgical history. Family History  Problem Relation Age of Onset  . Atrial fibrillation Father    History  Substance Use Topics  . Smoking status: Current Every Day Smoker -- 1.00 packs/day    Types: Cigarettes  . Smokeless tobacco: Not on file  . Alcohol Use: No   OB History    No data available     Review of Systems  Unable to perform ROS Neurological: Positive for dizziness.      Allergies  Phosphate; Artane; and Codeine  Home Medications   Prior to Admission medications   Medication Sig Start Date End Date Taking? Authorizing Provider  amLODipine (NORVASC) 10 MG tablet Take 10 mg by mouth daily.    Historical Provider, MD  ARIPiprazole (ABILIFY) 9.75  MG/1.3ML injection Inject into the muscle once. Give 2ml IM once a month per friend    Historical Provider, MD  benztropine (COGENTIN) 1 MG tablet Take 1 mg by mouth daily.    Historical Provider, MD  metoprolol tartrate (LOPRESSOR) 25 MG tablet Take 25 mg by mouth 2 (two) times daily.    Historical Provider, MD   BP 159/89 mmHg  Pulse 104  Temp(Src) 97.9 F (36.6 C) (Oral)  Resp 23  Ht 5\' 6"  (1.676 m)  Wt 157 lb 10.1 oz (71.5 kg)  BMI 25.45 kg/m2  SpO2 91%  LMP 12/29/2013 Physical Exam  Constitutional: She appears well-developed and well-nourished.  The patient is snoring heavily on exam. Upon awakening the patient was incoherent and could not follow commands to perform neurological testing.    HENT:  Head: Normocephalic and atraumatic.  Eyes: Conjunctivae are normal.  Neck: Normal range of motion. Neck supple.  Cardiovascular: Normal rate, regular rhythm and normal heart sounds.   Pulmonary/Chest: Effort normal and breath sounds normal. No respiratory distress.  Abdominal: Soft. There is no tenderness.  Genitourinary:  Rectal exam:  Chaperone presents. Brown stool. No blood per rectum.  No hemorrhoids or anal fissure.   Musculoskeletal: Normal range of motion.  Neurological: She is alert.  Patient is difficult to understand which her father states is normal for her.   Skin: Skin is warm and dry.  Nursing note and vitals reviewed.   ED Course  Procedures (including critical  care time) Labs Review Labs Reviewed  BASIC METABOLIC PANEL - Abnormal; Notable for the following:    Sodium 110 (*)    Chloride 73 (*)    Glucose, Bld 115 (*)    BUN <5 (*)    Creatinine, Ser 0.38 (*)    All other components within normal limits  HEPATIC FUNCTION PANEL - Abnormal; Notable for the following:    AST 47 (*)    ALT 67 (*)    All other components within normal limits  OSMOLALITY, URINE - Abnormal; Notable for the following:    Osmolality, Ur 110 (*)    All other components within  normal limits  OSMOLALITY - Abnormal; Notable for the following:    Osmolality 227 (*)    All other components within normal limits  BASIC METABOLIC PANEL - Abnormal; Notable for the following:    Sodium 113 (*)    Chloride 74 (*)    Glucose, Bld 100 (*)    BUN <5 (*)    Creatinine, Ser 0.36 (*)    All other components within normal limits  D-DIMER, QUANTITATIVE - Abnormal; Notable for the following:    D-Dimer, Quant 0.66 (*)    All other components within normal limits  BASIC METABOLIC PANEL - Abnormal; Notable for the following:    Sodium 118 (*)    Chloride 77 (*)    BUN <5 (*)    Creatinine, Ser 0.41 (*)    All other components within normal limits  BASIC METABOLIC PANEL - Abnormal; Notable for the following:    Sodium 120 (*)    Chloride 80 (*)    CO2 33 (*)    BUN <5 (*)    Creatinine, Ser 0.40 (*)    All other components within normal limits  CBC WITH DIFFERENTIAL/PLATELET - Abnormal; Notable for the following:    WBC 10.6 (*)    Neutrophils Relative % 78 (*)    Neutro Abs 8.2 (*)    Lymphocytes Relative 11 (*)    Monocytes Absolute 1.2 (*)    All other components within normal limits  MRSA PCR SCREENING  CBC  LIPASE, BLOOD  TROPONIN I  SODIUM, URINE, RANDOM  TSH  CORTISOL  URIC ACID  BRAIN NATRIURETIC PEPTIDE  TROPONIN I  TROPONIN I  OCCULT BLOOD X 1 CARD TO LAB, STOOL  TROPONIN I  BASIC METABOLIC PANEL  POC OCCULT BLOOD, ED    Imaging Review Dg Chest 2 View  10/14/2014   CLINICAL DATA:  Shortness of breath, change in color, was given a breathing treatment, history asthma, smoking  EXAM: CHEST  2 VIEW  COMPARISON:  11/24/2013  FINDINGS: Enlargement of cardiac silhouette with pulmonary vascular congestion.  Mediastinal contours normal.  No acute infiltrate, pleural effusion or pneumothorax.  No acute osseous findings.  Anterior density seen on lateral view at the inferior chest is likely related to callus at old LEFT rib fractures.  IMPRESSION: Enlargement  of cardiac silhouette with pulmonary vascular congestion.  No acute abnormalities.   Electronically Signed   By: Ulyses Southward M.D.   On: 10/14/2014 13:25   Ct Head Wo Contrast  10/14/2014   CLINICAL DATA:  Dizziness, confusion, unable to answer questions appropriately, history smoking, paranoid schizophrenia  EXAM: CT HEAD WITHOUT CONTRAST  TECHNIQUE: Contiguous axial images were obtained from the base of the skull through the vertex without intravenous contrast.  COMPARISON:  11/07/2013  FINDINGS: Scattered motion artifacts.  Normal ventricular morphology.  No midline shift  or mass effect.  Normal appearance of brain parenchyma.  No intracranial hemorrhage, mass lesion, or acute infarction.  Visualized paranasal sinuses and mastoid air cells clear.  Bones unremarkable.  IMPRESSION: No definite acute intracranial abnormalities.   Electronically Signed   By: Ulyses Southward M.D.   On: 10/14/2014 18:06   Ct Angio Chest Pe W/cm &/or Wo Cm  10/15/2014   CLINICAL DATA:  Wheezing and shortness of breath.  EXAM: CT ANGIOGRAPHY CHEST WITH CONTRAST  TECHNIQUE: Multidetector CT imaging of the chest was performed using the standard protocol during bolus administration of intravenous contrast. Multiplanar CT image reconstructions and MIPs were obtained to evaluate the vascular anatomy.  CONTRAST:  75mL OMNIPAQUE IOHEXOL 350 MG/ML SOLN  COMPARISON:  Chest radiograph 1 day prior.  FINDINGS: There are probable subsegmental filling defects in the left lower lobe pulmonary arteries consistent with pulmonary emboli. No right-sided pulmonary arterial filling defects are seen. Detailed evaluation is limited by patient breathing motion. There is enlargement of the main pulmonary artery measuring 3.4 cm.  There is multi chamber cardiomegaly. The thoracic aorta is normal in caliber. There is no pleural or pericardial effusion. No mediastinal or hilar adenopathy.  The esophagus is dilated contains intraluminal fluid. There is a moderate  hiatal hernia. Moderate whole body wall edema.  Detailed lung parenchymal evaluation is limited due to motion. There is no large consolidation.  Evaluation of the upper abdomen demonstrates no acute abnormality. Enlarged heterogeneous right lobe of the thyroid gland. There are multiple thyroid nodules within both lobes. Largest nodule measures 11 mm.  There are old left-sided rib fractures. No acute osseous abnormalities seen.  Review of the MIP images confirms the above findings.  IMPRESSION: 1. Suspect subsegmental pulmonary emboli to the left lower lobe. Breathing motion artifact obscures detailed evaluation. 2. Multi chamber cardiomegaly. There is enlargement of the main pulmonary artery measuring up to 3.4 cm. 3. Whole body wall edema, aspect third-spacing. 4. Moderate hiatal hernia with dilated esophagus, small fluid level. This can be seen in the setting of reflux. 5. Multinodular goiter, largest thyroid nodule measures 11 mm. Critical Value/emergent results were called by telephone at the time of interpretation on 10/15/2014 at 4:32 am to NP Burnadette Peter , who verbally acknowledged these results.   Electronically Signed   By: Rubye Oaks M.D.   On: 10/15/2014 04:33     EKG Interpretation None      MDM   Final diagnoses:  Hyponatremia   The patient was difficult to arouse on exam and snoring.  She has a history of schizophrenia. History by both patient and father are vague.  According to notes in the chart, her caretaker took her shopping this morning and the patient was short of breath. After bringing her to the hospital, radiology reports that the patient had a large blood tinged bowel movement while getting a chest xray.   Her rectal exam was negative for fecal occult blood. Her CBC is unremarkable.  She has hyponatremia at 110. I ordered a CT head and cardiac workup which is still pending.   The plan is to admit her once labs are back.  I have discussed this patient with and transferred care  to [facility].      Catha Gosselin, PA-C 10/15/14 7829  Gilda Crease, MD 10/18/14 [phone]

## 2014-10-14 NOTE — ED Notes (Signed)
Contacted lab to add-on labwork.

## 2014-10-14 NOTE — ED Notes (Signed)
[facility] and had a bloody stool in the radiology department.

## 2014-10-14 NOTE — ED Notes (Addendum)
Pt's father leaving, Ronaldo MiyamotoKyle EMT at bedside to sit with patient. Father states may call him for a ride home if patient is discharge. Phone numbers updated in Demographics. Pt is calm and resting at this time.

## 2014-10-14 NOTE — ED Notes (Addendum)
CRITICAL SODIUM 110 from Lab. EDPA aware.

## 2014-10-14 NOTE — ED Notes (Signed)
While in xray, patient had a large red tinged stool in floor

## 2014-10-14 NOTE — ED Provider Notes (Signed)
CSN: [id number]     Arrival date & time 10/14/14  1147 History   First MD Initiated Contact with Patient 10/14/14 1153     Chief Complaint  Patient presents with  . Shortness of Breath   (Consider location/radiation/quality/duration/timing/severity/associated sxs/prior Treatment) Patient is a 59 y.o. female presenting with shortness of breath. The history is provided by the patient, a caregiver and a relative.  Shortness of Breath Severity:  Moderate Onset quality:  Gradual Duration:  2 hours Progression:  Unchanged Chronicity:  New Context: smoke exposure   Context comment:  Rather abrupt onset of sx this am with sob, confusion. and weakness. Relieved by:  None tried Worsened by:  Nothing tried Ineffective treatments:  None tried Associated symptoms: cough, sputum production and wheezing   Associated symptoms: no abdominal pain, no chest pain and no fever   Risk factors: tobacco use   Risk factors comment:  Schizophrenia   Past Medical History  Diagnosis Date  . Paranoid schizophrenia   . Venous insufficiency of right lower extremity    History reviewed. No pertinent past surgical history. History reviewed. No pertinent family history. History  Substance Use Topics  . Smoking status: Current Every Day Smoker -- 1.00 packs/day    Types: Cigarettes  . Smokeless tobacco: Not on file  . Alcohol Use: No   OB History    No data available     Review of Systems  Constitutional: Negative.  Negative for fever.  HENT: Positive for congestion and postnasal drip.   Respiratory: Positive for cough, sputum production, shortness of breath and wheezing.   Cardiovascular: Negative for chest pain.  Gastrointestinal: Negative for abdominal pain.    Allergies  Phosphate; Artane; and Codeine  Home Medications   Prior to Admission medications   Medication Sig Start Date End Date Taking? Authorizing Provider  amLODipine (NORVASC) 10 MG tablet Take 10 mg by mouth daily.     Historical Provider, MD  ARIPiprazole (ABILIFY) 9.75 MG/1.3ML injection Inject into the muscle once. Give 2ml IM once a month per friend    Historical Provider, MD  benztropine (COGENTIN) 1 MG tablet Take 1 mg by mouth daily.    Historical Provider, MD  metoprolol tartrate (LOPRESSOR) 25 MG tablet Take 25 mg by mouth 2 (two) times daily.    Historical Provider, MD   BP 149/85 mmHg  Pulse 72  Resp 26  SpO2 91%  LMP 12/29/2013 Physical Exam  Constitutional: She is oriented to person, place, and time. She appears well-developed and well-nourished.  HENT:  Mouth/Throat: Oropharynx is clear and moist.  Eyes: Pupils are equal, round, and reactive to light.  Neck: Normal range of motion. Neck supple.  Cardiovascular: Normal rate, normal heart sounds and intact distal pulses.   Pulmonary/Chest: She has wheezes.  Neurological: She is alert and oriented to person, place, and time.  Skin: Skin is warm and dry.  Nursing note and vitals reviewed.   ED Course  Procedures (including critical care time) Labs Review Labs Reviewed - No data to display  Imaging Review Dg Chest 2 View  10/14/2014   CLINICAL DATA:  Shortness of breath, change in color, was given a breathing treatment, history asthma, smoking  EXAM: CHEST  2 VIEW  COMPARISON:  11/24/2013  FINDINGS: Enlargement of cardiac silhouette with pulmonary vascular congestion.  Mediastinal contours normal.  No acute infiltrate, pleural effusion or pneumothorax.  No acute osseous findings.  Anterior density seen on lateral view at the inferior chest is likely related to  callus at old LEFT rib fractures.  IMPRESSION: Enlargement of cardiac silhouette with pulmonary vascular congestion.  No acute abnormalities.   Electronically Signed   By: Ulyses Southward M.D.   On: 10/14/2014 13:25    X-rays reviewed and report per radiologist.  MDM   1. Gastrointestinal hemorrhage, unspecified gastritis, unspecified gastrointestinal hemorrhage type   2. SOB  (shortness of breath)    Pt reportedly had a bloody stool on the floor in radiology dept. Will transfer for further eval.    Linna Hoff, MD 10/14/14 [phone]

## 2014-10-14 NOTE — H&P (Signed)
[facility] History and Physical  Amiyrah Lamere ZOX:[id number] DOB: April 10, 1956 DOA: 10/14/2014  Referring physician: ER physician. PCP: Irving Copas, MD   History obtained from ER physician and patient's father.  Chief Complaint: Shortness of breath.  HPI: Nancy Erickson is a 59 y.o. female with history of schizophrenia, hypertension was referred from group home after patient was having shortness of breath. In the ER patient was found to be mildly wheezing and was placed on nebulizers initially. Patient's blood work shows severe hyponatremia with sodium of 110. On exam patient is providing minimal history and as per patient's father patient's mental status is at baseline. Patient also was recently noticed increasing redness and edema around the lower extremity. Chest x-ray shows mild congestion. Patient has per the father has not been started on any new medications. Patient did not have any nausea vomiting abdominal pain diarrhea chest pain. As per patient's father patient drinks a lot of diet soda, ice tea and milk daily. She drinks almost a gallon of milk and a dozen of can soda. Patient also smokes cigarettes as per patient's father. Does not drink alcohol.   Review of Systems: As presented in the history of presenting illness, rest negative.  Past Medical History  Diagnosis Date  . Paranoid schizophrenia   . Venous insufficiency of right lower extremity   . Hypertension    History reviewed. No pertinent past surgical history. Social History:  reports that she has been smoking Cigarettes.  She has been smoking about 1.00 pack per day. She does not have any smokeless tobacco history on file. She reports that she does not drink alcohol or use illicit drugs. Where does patient live group home. Can patient participate in ADLs? No.  Allergies  Allergen Reactions  . Phosphate Nausea And Vomiting  . Artane [Trihexyphenidyl] Other (See Comments)    unknown  . Codeine  Other (See Comments)    unknown    Family History:  Family History  Problem Relation Age of Onset  . Atrial fibrillation Father       Prior to Admission medications   Medication Sig Start Date End Date Taking? Authorizing Provider  amLODipine (NORVASC) 10 MG tablet Take 10 mg by mouth daily.    Historical Provider, MD  ARIPiprazole (ABILIFY) 9.75 MG/1.3ML injection Inject into the muscle once. Give 2ml IM once a month per friend    Historical Provider, MD  benztropine (COGENTIN) 1 MG tablet Take 1 mg by mouth daily.    Historical Provider, MD  metoprolol tartrate (LOPRESSOR) 25 MG tablet Take 25 mg by mouth 2 (two) times daily.    Historical Provider, MD    Physical Exam: Filed Vitals:   10/14/14 1545 10/14/14 1650 10/14/14 1900 10/14/14 1915  BP: 160/90 156/89 176/88 185/91  Pulse: 56 85 95 95  Temp:      TempSrc:      Resp: Height:      Weight:      SpO2: 93% 93% 89% 91%     General:  Moderately built and nourished.  Eyes: Anicteric no pallor.  ENT: No discharge from ears eyes nose more.  Neck: No mass felt.  Cardiovascular: S1-S2 heard.  Respiratory: No rhonchi or crepitation at this time.  Abdomen: Soft nontender bowel sounds present.  Skin: Bilateral lower extremity erythema with petechia.  Musculoskeletal: Bilateral lower extremity edema.  Psychiatric: Patient is very minimally communicative.  Neurologic: Alert awake and oriented to her name. Moves all extremities.  Labs  on Admission:  Basic Metabolic Panel:  Recent Labs Lab 10/14/14 1453  NA 110*  K 4.1  CL 73*  CO2 27  GLUCOSE 115*  BUN <5*  CREATININE 0.38*  CALCIUM 8.5   Liver Function Tests:  Recent Labs Lab 10/14/14 1519  AST 47*  ALT 67*  ALKPHOS 113  BILITOT 0.8  PROT 6.5  ALBUMIN 3.7    Recent Labs Lab 10/14/14 1519  LIPASE 39   No results for input(s): AMMONIA in the last 168 hours. CBC:  Recent Labs Lab 10/14/14 1453  WBC 10.1  HGB 14.6  HCT  40.8  MCV 83.4  PLT 360   Cardiac Enzymes:  Recent Labs Lab 10/14/14 1519  TROPONINI <0.03    BNP (last 3 results) No results for input(s): BNP in the last 8760 hours.  ProBNP (last 3 results) No results for input(s): PROBNP in the last 8760 hours.  CBG: No results for input(s): GLUCAP in the last 168 hours.  Radiological Exams on Admission: Dg Chest 2 View  10/14/2014   CLINICAL DATA:  Shortness of breath, change in color, was given a breathing treatment, history asthma, smoking  EXAM: CHEST  2 VIEW  COMPARISON:  11/24/2013  FINDINGS: Enlargement of cardiac silhouette with pulmonary vascular congestion.  Mediastinal contours normal.  No acute infiltrate, pleural effusion or pneumothorax.  No acute osseous findings.  Anterior density seen on lateral view at the inferior chest is likely related to callus at old LEFT rib fractures.  IMPRESSION: Enlargement of cardiac silhouette with pulmonary vascular congestion.  No acute abnormalities.   Electronically Signed   By: Ulyses SouthwardMark  Boles M.D.   On: 10/14/2014 13:25   Ct Head Wo Contrast  10/14/2014   CLINICAL DATA:  Dizziness, confusion, unable to answer questions appropriately, history smoking, paranoid schizophrenia  EXAM: CT HEAD WITHOUT CONTRAST  TECHNIQUE: Contiguous axial images were obtained from the base of the skull through the vertex without intravenous contrast.  COMPARISON:  11/07/2013  FINDINGS: Scattered motion artifacts.  Normal ventricular morphology.  No midline shift or mass effect.  Normal appearance of brain parenchyma.  No intracranial hemorrhage, mass lesion, or acute infarction.  Visualized paranasal sinuses and mastoid air cells clear.  Bones unremarkable.  IMPRESSION: No definite acute intracranial abnormalities.   Electronically Signed   By: Ulyses SouthwardMark  Boles M.D.   On: 10/14/2014 18:06    EKG: Independently reviewed. Normal sinus rhythm with biatrial enlargement.  Assessment/Plan Principal Problem:   SOB (shortness of  breath) Active Problems:   Schizo-affective schizophrenia   Hyponatremia   Hypertension, uncontrolled   1. 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 Patient's father states that patient drinks a gallon of milk and at least a dozen of canned soda daily along with ice tea. At this time we will fluid restrict patient to 800 mL/h orally. Closely follow metabolic panel. In addition we have ordered urine sodium or osmolality and serum osmolality and TSH and cortisol levels. Closely follow metabolic panel. Seizure precautions. 2. Shortness of breath - cause not clear. The patient was initially mildly wheezing and was given nebulizer which we will continue. Patient also has mild chest congestion will check BNP and d-dimer and 2-D echo. Closely observe. 3. Lower extremity edema with venous congestion stasis dermatitis - check Dopplers of the lower extremity. 4. Hypertension uncontrolled - continue present medication and I have placed patient on when necessary IV hydralazine. Closely follow blood pressure trends. 5. Schizophrenia - continue present medications. Closely follow sodium trends.  DVT Prophylaxis Lovenox.  Code Status: Full code.  Family Communication: Patient's father.  Disposition Plan: Admit to inpatient.    KAKRAKANDY,ARSHAD N. [facility] Pager [phone].  If 7PM-7AM, please contact night-coverage [email or web address] Password [email or web address] 10/14/2014, 8:10 PM

## 2014-10-15 ENCOUNTER — Inpatient Hospital Stay (HOSPITAL_COMMUNITY): Payer: Medicare Other

## 2014-10-15 ENCOUNTER — Encounter (HOSPITAL_COMMUNITY): Payer: Self-pay | Admitting: Radiology

## 2014-10-15 DIAGNOSIS — F200 Paranoid schizophrenia: Secondary | ICD-10-CM

## 2014-10-15 DIAGNOSIS — E222 Syndrome of inappropriate secretion of antidiuretic hormone: Secondary | ICD-10-CM

## 2014-10-15 DIAGNOSIS — I2699 Other pulmonary embolism without acute cor pulmonale: Secondary | ICD-10-CM | POA: Diagnosis present

## 2014-10-15 LAB — BASIC METABOLIC PANEL
ANION GAP: 5 (ref 5–15)
Anion gap: 7 (ref 5–15)
Anion gap: 9 (ref 5–15)
BUN: <5 mg/dL — ABNORMAL LOW (ref 6–23)
BUN: <5 mg/dL — ABNORMAL LOW (ref 6–23)
CALCIUM: 8.4 mg/dL (ref 8.4–10.5)
CHLORIDE: 77 mmol/L — AB (ref 96–112)
CO2: 32 mmol/L (ref 19–32)
CO2: 33 mmol/L — AB (ref 19–32)
CO2: 37 mmol/L — AB (ref 19–32)
CREATININE: 0.41 mg/dL — AB (ref 0.50–1.10)
Calcium: 8.5 mg/dL (ref 8.4–10.5)
Calcium: 8.1 mg/dL — ABNORMAL LOW (ref 8.4–10.5)
Chloride: 80 mmol/L — ABNORMAL LOW (ref 96–112)
Chloride: 82 mmol/L — ABNORMAL LOW (ref 96–112)
Creatinine, Ser: 0.4 mg/dL — ABNORMAL LOW (ref 0.50–1.10)
Creatinine, Ser: 0.47 mg/dL — ABNORMAL LOW (ref 0.50–1.10)
GFR calc Af Amer: >90 mL/min (ref >90)
GFR calc Af Amer: >90 mL/min (ref >90)
GFR calc Af Amer: >90 mL/min (ref >90)
GFR calc non Af Amer: >90 mL/min (ref >90)
GFR calc non Af Amer: >90 mL/min (ref >90)
GLUCOSE: 85 mg/dL (ref 70–99)
Glucose, Bld: 114 mg/dL — ABNORMAL HIGH (ref 70–99)
Glucose, Bld: 97 mg/dL (ref 70–99)
POTASSIUM: 3.5 mmol/L (ref 3.5–5.1)
Potassium: 3.6 mmol/L (ref 3.5–5.1)
Potassium: 3.2 mmol/L — ABNORMAL LOW (ref 3.5–5.1)
SODIUM: 120 mmol/L — AB (ref 135–145)
Sodium: 118 mmol/L — CL (ref 135–145)
Sodium: 124 mmol/L — ABNORMAL LOW (ref 135–145)

## 2014-10-15 LAB — CBC WITH DIFFERENTIAL/PLATELET
Basophils Absolute: 0 10*3/uL (ref 0.0–0.1)
Basophils Relative: 0 % (ref 0–1)
Eosinophils Absolute: 0 10*3/uL (ref 0.0–0.7)
Eosinophils Relative: 0 % (ref 0–5)
HEMATOCRIT: 41.5 % (ref 36.0–46.0)
HEMOGLOBIN: 14.7 g/dL (ref 12.0–15.0)
Lymphocytes Relative: 11 % — ABNORMAL LOW (ref 12–46)
Lymphs Abs: 1.2 10*3/uL (ref 0.7–4.0)
MCH: 29.8 pg (ref 26.0–34.0)
MCHC: 35.4 g/dL (ref 30.0–36.0)
MCV: 84 fL (ref 78.0–100.0)
Monocytes Absolute: 1.2 10*3/uL — ABNORMAL HIGH (ref 0.1–1.0)
Monocytes Relative: 11 % (ref 3–12)
Neutro Abs: 8.2 10*3/uL — ABNORMAL HIGH (ref 1.7–7.7)
Neutrophils Relative %: 78 % — ABNORMAL HIGH (ref 43–77)
Platelets: 397 10*3/uL (ref 150–400)
RBC: 4.94 MIL/uL (ref 3.87–5.11)
RDW: 13.1 % (ref 11.5–15.5)
WBC: 10.6 10*3/uL — ABNORMAL HIGH (ref 4.0–10.5)

## 2014-10-15 LAB — OSMOLALITY: Osmolality: 227 mOsm/kg — ABNORMAL LOW (ref 275–300)

## 2014-10-15 LAB — OSMOLALITY, URINE: Osmolality, Ur: 110 mOsm/kg — ABNORMAL LOW (ref 390–1090)

## 2014-10-15 LAB — TROPONIN I
Troponin I: <0.03 ng/mL (ref <0.031)
Troponin I: <0.03 ng/mL (ref <0.031)

## 2014-10-15 LAB — CORTISOL: Cortisol, Plasma: 32.1 ug/dL

## 2014-10-15 MED ORDER — ALBUTEROL SULFATE (2.5 MG/3ML) 0.083% IN NEBU: 2.5000 mg | INHALATION_SOLUTION | Freq: Three times a day (TID) | RESPIRATORY_TRACT | Status: DC

## 2014-10-15 MED ORDER — IPRATROPIUM-ALBUTEROL 0.5-2.5 (3) MG/3ML IN SOLN
3.0000 mL | Freq: Three times a day (TID) | RESPIRATORY_TRACT | Status: DC
  Administered 2014-10-15 – 2014-10-17 (×6): 3 mL via RESPIRATORY_TRACT
  Filled 2014-10-15 (×6): qty 3

## 2014-10-15 MED ORDER — IPRATROPIUM BROMIDE 0.02 % IN SOLN: 0.5000 mg | Freq: Three times a day (TID) | RESPIRATORY_TRACT | Status: DC

## 2014-10-15 MED ORDER — IPRATROPIUM BROMIDE 0.02 % IN SOLN
0.5000 mg | RESPIRATORY_TRACT | Status: DC
Start: 2014-10-15 — End: 2014-10-15
  Administered 2014-10-15 (×2): 0.5 mg via RESPIRATORY_TRACT
  Filled 2014-10-15 (×2): qty 2.5

## 2014-10-15 MED ORDER — ENOXAPARIN SODIUM 80 MG/0.8ML ~~LOC~~ SOLN
70.0000 mg | Freq: Two times a day (BID) | SUBCUTANEOUS | Status: DC
  Administered 2014-10-15 – 2014-10-19 (×7): 70 mg via SUBCUTANEOUS
  Filled 2014-10-15 (×13): qty 0.8

## 2014-10-15 MED ORDER — IOHEXOL 350 MG/ML SOLN
75.0000 mL | Freq: Once | INTRAVENOUS | Status: AC | PRN
  Administered 2014-10-15: 75 mL via INTRAVENOUS

## 2014-10-15 NOTE — Progress Notes (Signed)
  Echocardiogram 2D Echocardiogram has been performed.  Johnathan HausenMUCKER, Shanin Szymanowski M 10/15/2014, 9:49 AM

## 2014-10-15 NOTE — Progress Notes (Signed)
[place] TEAM 1 - Stepdown/ICU TEAM Progress Note  Nancy LandmarkMarilyn Erickson ZOX:[id number] DOB: [birth year] DOA: 10/14/2014 PCP: Irving CopasHACKER,ROBERT KELLER, MD  Admit HPI / Brief Narrative: Nancy Erickson is a 59 y.o. WF PMHx paranoid schizophrenia, hypertension Referred from group home after patient was having shortness of breath. In the ER patient was found to be mildly wheezing and was placed on nebulizers initially. Patient's blood work shows severe hyponatremia with sodium of 110. On exam patient is providing minimal history and as per patient's father patient's mental status is at baseline. Patient also was recently noticed increasing redness and edema around the lower extremity. Chest x-ray shows mild congestion. Patient has per the father has not been started on any new medications. Patient did not have any nausea vomiting abdominal pain diarrhea chest pain. As per patient's father patient drinks a lot of diet soda, ice tea and milk daily. She drinks almost a gallon of milk and a dozen of can soda. Patient also smokes cigarettes as per patient's father. Does not drink alcohol.   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 Patient's father states that patient drinks a gallon of milk and at least a dozen of canned soda daily along with ice tea. At this time we will fluid restrict patient to 800 mL/h orally. Closely follow metabolic panel.  -urine sodium= 20,  urine osmolality = 110  -serum osmolality= 227  -TSH  thin normal limit, cortisol level elevated.  -Closely follow metabolic panel. Seizure precautions. -Uric acid within normal limit -Continue fluid restriction 82900ml/dy  SOB secondary to Pulmonary embolism -Continue Lovenox 70 mg BID -Will need to have discussion with HPOA concerning which agent to discharge patient home on. -Echocardiogram pending -Lower extremity Dopplers pending  Lower  extremity edema with venous congestion stasis dermatitis - Dopplers of the lower extremity Pending .  Hypertension uncontrolled -Continue amlodipine 10 mg daily -Continue metoprolol 25 mg BID -Continue hydralazine PRN   Paranoid schizophrenia  -Continue Abilify 9.75 mg IM q 30 days -Continue benztropine 1 mg daily  -Consult psychiatry in a.m. for competency, medication recommendation   Code Status: FULL Family Communication: no family present at time of exam Disposition Plan: Resolution hyponatremia    Consultants: NA   Procedure/Significant Events: 3/12 CT angiogram PE protocol;- Suspect subsegmental pulmonary emboli to LLL. -Multi chamber cardiomegaly. -Enlargement of the main pulmonary artery measuring up to 3.4 cm. -Moderate hiatal hernia with dilated esophagus, small fluid level. - Multinodular goiter, largest thyroid nodule measures 11 mm. 3/12 echocardiogram;    Culture NA   Antibiotics: NA   DVT prophylaxis: Lovenox   Devices NA   LINES / TUBES:  NA    Continuous Infusions:   Objective: VITAL SIGNS: Temp: 97.9 F (36.6 C) (03/12 0716) Temp Source: Oral (03/12 0716) BP: 159/89 mmHg (03/12 0716) Pulse Rate: 104 (03/12 0716) SPO2; FIO2:   Intake/Output Summary (Last 24 hours) at 10/15/14 1548 Last data filed at 10/15/14 1500  Gross per 24 hour  Intake    720 ml  Output   5150 ml  Net  -4430 ml     Exam: General:  A/O 4, NAD,No acute respiratory distress Lungs: Clear to auscultation bilaterally without wheezes or crackles Cardiovascular: Regular rate and rhythm without murmur gallop or rub normal S1 and S2 Abdomen: Nontender, nondistended, soft, bowel sounds positive, no rebound, no ascites, no appreciable mass Extremities: No significant cyanosis, clubbing, or edema bilateral lower extremities  Data Reviewed:  Basic Metabolic Panel:  Recent Labs Lab 10/14/14 1453 10/14/14 1918 10/15/14 10/15/14 0350 10/15/14 0810  NA 110*  113* 118* 120* 124*  K 4.1 4.0 3.5 3.6 3.2*  CL 73* 74* 77* 80* 82*  CO2 27 29 32 33* 37*  GLUCOSE 115* 100* 97 85 114*  BUN <5* <5* <5* <5* <5*  CREATININE 0.38* 0.36* 0.41* 0.40* 0.47*  CALCIUM 8.5 8.8 8.4 8.5 8.1*   Liver Function Tests:  Recent Labs Lab 10/14/14 1519  AST 47*  ALT 67*  ALKPHOS 113  BILITOT 0.8  PROT 6.5  ALBUMIN 3.7    Recent Labs Lab 10/14/14 1519  LIPASE 39   No results for input(s): AMMONIA in the last 168 hours. CBC:  Recent Labs Lab 10/14/14 1453 10/15/14 0350  WBC 10.1 10.6*  NEUTROABS  --  8.2*  HGB 14.6 14.7  HCT 40.8 41.5  MCV 83.4 84.0  PLT 360 397   Cardiac Enzymes:  Recent Labs Lab 10/14/14 1519 10/14/14 2102 10/15/14 0240 10/15/14 0810  TROPONINI <0.03 <0.03 <0.03 <0.03   BNP (last 3 results)  Recent Labs  10/14/14 2106  BNP 88.0    ProBNP (last 3 results) No results for input(s): PROBNP in the last 8760 hours.  CBG: No results for input(s): GLUCAP in the last 168 hours.  Recent Results (from the past 240 hour(s))  MRSA PCR Screening     Status: None   Collection Time: 10/14/14  8:00 PM  Result Value Ref Range Status   MRSA by PCR NEGATIVE NEGATIVE Final    Comment:        The GeneXpert MRSA Assay (FDA approved for NASAL specimens only), is one component of a comprehensive MRSA colonization surveillance program. It is not intended to diagnose MRSA infection nor to guide or monitor treatment for MRSA infections.      Studies:  Recent x-ray studies have been reviewed in detail by the Attending Physician  Scheduled Meds:  Scheduled Meds: . amLODipine  10 mg Oral Daily  . [START ON 10/20/2014] ARIPiprazole  9.75 mg Intramuscular Q30 days  . benztropine  1 mg Oral Daily  . enoxaparin (LOVENOX) injection  70 mg Subcutaneous Q12H  . ipratropium-albuterol  3 mL Nebulization TID  . metoprolol tartrate  25 mg Oral BID  . sodium chloride  3 mL Intravenous Q12H    Time spent on care of this  patient: 40 mins   WOODS, Roselind Messier , MD  [facility] Office  [phone] Pager - [phone]  On-Call/Text Page:      [email or web address]      password [email or web address]  If 7PM-7AM, please contact night-coverage [email or web address] Password [facility] 10/15/2014, 3:48 PM   LOS: 1 day   Care during the described time interval was provided by me .  I have reviewed this patient's available data, including medical history, events of note, physical examination, radiology studies and test results as part of my evaluation  Carolyne Littles, MD [phone] Pager

## 2014-10-16 DIAGNOSIS — Z9114 Patient's other noncompliance with medication regimen: Secondary | ICD-10-CM

## 2014-10-16 DIAGNOSIS — I272 Pulmonary hypertension, unspecified: Secondary | ICD-10-CM | POA: Diagnosis present

## 2014-10-16 DIAGNOSIS — I429 Cardiomyopathy, unspecified: Secondary | ICD-10-CM | POA: Diagnosis present

## 2014-10-16 DIAGNOSIS — I270 Primary pulmonary hypertension: Secondary | ICD-10-CM

## 2014-10-16 MED ORDER — ISOSORBIDE MONONITRATE 10 MG PO TABS
10.0000 mg | ORAL_TABLET | Freq: Two times a day (BID) | ORAL | Status: DC
  Administered 2014-10-17 – 2014-10-20 (×6): 10 mg via ORAL
  Filled 2014-10-16 (×11): qty 1

## 2014-10-16 MED ORDER — LORAZEPAM 1 MG PO TABS
1.0000 mg | ORAL_TABLET | ORAL | Status: DC | PRN
Start: 2014-10-16 — End: 2014-10-21
  Administered 2014-10-16 – 2014-10-20 (×8): 1 mg via ORAL
  Filled 2014-10-16 (×9): qty 1

## 2014-10-16 MED ORDER — RISPERIDONE 3 MG PO TABS
3.0000 mg | ORAL_TABLET | Freq: Two times a day (BID) | ORAL | Status: DC
  Administered 2014-10-16: 3 mg via ORAL
  Filled 2014-10-16 (×3): qty 1

## 2014-10-16 MED ORDER — METOPROLOL TARTRATE 25 MG PO TABS
25.0000 mg | ORAL_TABLET | Freq: Two times a day (BID) | ORAL | Status: DC
  Administered 2014-10-16 – 2014-10-17 (×2): 25 mg via ORAL
  Filled 2014-10-16 (×3): qty 1

## 2014-10-16 MED ORDER — POTASSIUM CHLORIDE CRYS ER 20 MEQ PO TBCR
40.0000 meq | EXTENDED_RELEASE_TABLET | Freq: Two times a day (BID) | ORAL | Status: DC
  Administered 2014-10-16: 40 meq via ORAL
  Filled 2014-10-16 (×3): qty 2

## 2014-10-16 MED ORDER — METOPROLOL TARTRATE 25 MG PO TABS
37.5000 mg | ORAL_TABLET | Freq: Two times a day (BID) | ORAL | Status: DC
  Filled 2014-10-16: qty 1

## 2014-10-16 MED ORDER — ISOSORBIDE MONONITRATE 10 MG PO TABS
5.0000 mg | ORAL_TABLET | Freq: Two times a day (BID) | ORAL | Status: DC
  Filled 2014-10-16 (×2): qty 0.5

## 2014-10-16 NOTE — Progress Notes (Addendum)
Nancy Erickson ZOX:[id number] DOB: [birth year] DOA: 10/14/2014 PCP: Irving CopasHACKER,ROBERT KELLER, MD  Admit HPI / Brief Narrative: Nancy Erickson is a 59 y.o. WF PMHx paranoid schizophrenia, hypertension Referred from group home after patient was having shortness of breath. In the ER patient was found to be mildly wheezing and was placed on nebulizers initially. Patient's blood work shows severe hyponatremia with sodium of 110. On exam patient is providing minimal history and as per patient's father patient's mental status is at baseline. Patient also was recently noticed increasing redness and edema around the lower extremity. Chest x-ray shows mild congestion. Patient has per the father has not been started on any new medications. Patient did not have any nausea vomiting abdominal pain diarrhea chest pain. As per patient's father patient drinks a lot of diet soda, ice tea and milk daily. She drinks almost a gallon of milk and a dozen of can soda. Patient also smokes cigarettes as per patient's father. Does not drink alcohol.   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 Patient's father states that patient drinks a gallon of milk and at least a dozen of canned soda daily along with ice tea. At this time we will fluid restrict patient to 800 mL/h orally. Improving.  -urine sodium= 20,  urine osmolality = 110  -serum osmolality= 227  -TSH  thin normal limit, cortisol level elevated.  -Closely follow metabolic panel. Seizure precautions. -Uric acid within normal limit -Continue fluid restriction 81200ml/dy  Hypokalemia -K-Dur 40 mEq 2 doses -Potassium goal> 4 -Magnesium  SOB secondary to Pulmonary embolism -Continue Lovenox 70 mg BID -Will need to have discussion with HPOA concerning which agent to discharge patient home  on. -Echocardiogram; see results below -Lower extremity Dopplers pending  Cardiomyopathy -Discontinue amlodipine 10 mg daily -Continue Metoprolol 25 mg BID -Start Imdur 10 mg BID (more appropriate for pulmonary hypertension) -Strict in and out -Daily weight  Pulmonary hypertension -See cardiomyopathy  Hypertension uncontrolled -See cardiomyopathy  Lower extremity edema with venous congestion stasis dermatitis - Dopplers of the lower extremity Pending .  Paranoid schizophrenia  -Continue Abilify 9.75 mg IM q 30 days -Continue benztropine 1 mg daily  -Will await psychiatric recommendations for medication changes. -Does patient require IVC upon discharge?    Code Status: FULL Family Communication: no family present at time of exam Disposition Plan: Resolution hyponatremia    Consultants: NA   Procedure/Significant Events: 3/12 CT angiogram PE protocol;- Suspect subsegmental pulmonary emboli to LLL. -Multi chamber cardiomegaly. -Enlargement of the main pulmonary artery measuring up to 3.4 cm. -Moderate hiatal hernia with dilated esophagus, small fluid level. - Multinodular goiter, largest thyroid nodule measures 11 mm. 3/12 echocardiogram;- LVEF= 60%- 65%. - Lt/Rt atrium: mildly to moderately dilated. - Right ventricle: The cavity size was dilated.  - Pulmonary arteries:PA peak pressure: 46 mm Hg (S).    Culture NA   Antibiotics: NA   DVT prophylaxis: Lovenox   Devices NA   LINES / TUBES:  NA    Continuous Infusions:   Objective: VITAL SIGNS: Temp: 97.8 F (36.6 C) (03/13 1651) Temp Source: Oral (03/13 1651) BP: 145/75 mmHg (03/13 1651) Pulse Rate: 100 (03/13 0727) SPO2; FIO2:   Intake/Output Summary (Last 24 hours) at 10/16/14 1710 Last data filed at 10/16/14 1600  Gross per 24 hour  Intake    700 ml  Output  2000 ml  Net  -1300 ml     Exam: General:  A/O 2 (unsure of when, why), NAD,No acute respiratory distress Lungs: Clear  to auscultation bilaterally without wheezes or crackles Cardiovascular: Regular rate and rhythm without murmur gallop or rub normal S1 and S2 Abdomen: Nontender, nondistended, soft, bowel sounds positive, no rebound, no ascites, no appreciable mass Extremities: No significant cyanosis, clubbing, or edema bilateral lower extremities  Data Reviewed: Basic Metabolic Panel:  Recent Labs Lab 10/14/14 1453 10/14/14 1918 10/15/14 10/15/14 0350 10/15/14 0810  NA 110* 113* 118* 120* 124*  K 4.1 4.0 3.5 3.6 3.2*  CL 73* 74* 77* 80* 82*  CO2 27 29 32 33* 37*  GLUCOSE 115* 100* 97 85 114*  BUN <5* <5* <5* <5* <5*  CREATININE 0.38* 0.36* 0.41* 0.40* 0.47*  CALCIUM 8.5 8.8 8.4 8.5 8.1*   Liver Function Tests:  Recent Labs Lab 10/14/14 1519  AST 47*  ALT 67*  ALKPHOS 113  BILITOT 0.8  PROT 6.5  ALBUMIN 3.7    Recent Labs Lab 10/14/14 1519  LIPASE 39   No results for input(s): AMMONIA in the last 168 hours. CBC:  Recent Labs Lab 10/14/14 1453 10/15/14 0350  WBC 10.1 10.6*  NEUTROABS  --  8.2*  HGB 14.6 14.7  HCT 40.8 41.5  MCV 83.4 84.0  PLT 360 397   Cardiac Enzymes:  Recent Labs Lab 10/14/14 1519 10/14/14 2102 10/15/14 0240 10/15/14 0810  TROPONINI <0.03 <0.03 <0.03 <0.03   BNP (last 3 results)  Recent Labs  10/14/14 2106  BNP 88.0    ProBNP (last 3 results) No results for input(s): PROBNP in the last 8760 hours.  CBG: No results for input(s): GLUCAP in the last 168 hours.  Recent Results (from the past 240 hour(s))  MRSA PCR Screening     Status: None   Collection Time: 10/14/14  8:00 PM  Result Value Ref Range Status   MRSA by PCR NEGATIVE NEGATIVE Final    Comment:        The GeneXpert MRSA Assay (FDA approved for NASAL specimens only), is one component of a comprehensive MRSA colonization surveillance program. It is not intended to diagnose MRSA infection nor to guide or monitor treatment for MRSA infections.       Studies:  Recent x-ray studies have been reviewed in detail by the Attending Physician  Scheduled Meds:  Scheduled Meds: . [START ON 10/20/2014] ARIPiprazole  9.75 mg Intramuscular Q30 days  . benztropine  1 mg Oral Daily  . enoxaparin (LOVENOX) injection  70 mg Subcutaneous Q12H  . ipratropium-albuterol  3 mL Nebulization TID  . isosorbide mononitrate  5 mg Oral BID WC  . metoprolol tartrate  37.5 mg Oral BID  . potassium chloride  40 mEq Oral BID  . risperiDONE  3 mg Oral BID  . sodium chloride  3 mL Intravenous Q12H    Time spent on care of this patient: 40 mins   Mindie Rawdon, Roselind Messier , MD  [facility] Office  [phone] Pager - [phone]  On-Call/Text Page:      [email or web address]      password [email or web address]  If 7PM-7AM, please contact night-coverage [email or web address] Password [email or web address] 10/16/2014, 5:10 PM   LOS: 2 days   Care during the described time interval was provided by me .  I have reviewed this patient's available data, including medical history, events of note, physical examination, radiology studies and test results as part of my evaluation  Carolyne Littles, MD  [phone] Pager

## 2014-10-16 NOTE — Consult Note (Signed)
[facility]   Reason for Consult:  Confusion, psychosis Referring Physician:  Dr. Joseph ArtWoods Patient Identification: Nancy Erickson MRN:  [id number] Principal Diagnosis: SOB (shortness of breath) Diagnosis:   Patient Active Problem List   Diagnosis Date Noted  . Pulmonary embolus [I26.99]   . 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 Paranoid schizophrenia [F20.0]   . SOB (shortness of breath) [R06.02] 10/14/2014  . Hypertension, uncontrolled [I10] 10/14/2014  . Schizo-affective schizophrenia [F25.0] 10/12/2013  . Hyponatremia [E87.1] 10/12/2013  . Altered mental state [R41.82] 10/12/2013  . HTN (hypertension) [I10] 10/11/2013  . Hypertensive urgency [I10] 10/11/2013    Total Time spent with patient: 30 minutes  Subjective:   Nancy LandmarkMarilyn Coby is a 59 y.o. female patient admitted with shortness of breath  HPI: This patient is a 59 year old white female with a history of schizophrenia and hypertension. Apparently she was referred from her group home after having shortness of breath. She's a very poor historian and difficult to understand but apparently she lives in a group home and is getting help [facility]. In reviewing her records she's had very poor medication compliance and at times has overused medications and needed to be admitted for the consequences. Currently she is on injectable Abilify once a month as well as Cogentin. In the past she has been on Risperdal.  Currently the patient has been drinking excessively, primarily diet soda ice tea and milk. She was severely hyponatremic on admission and this is improving with fluid restriction. She also has been diagnosed with a pulmonary embolism. Mental status she is extremely disorganized and difficult to understand due to dysarthria. She attempted to write down where she lives in her phone numbers but it was very garbled. Her nurse reports that the patient repeats the same  things over and over again, is responding to internal stimuli and talking to people who aren't there. She is anxious and has not been able to sleep for the last 24 hours. She cannot answer questions coherently HPI Elements:   Location:  Global. Quality:  Severe. Severity:  Severe. Timing:  Several days. Duration:  Years. Context:  Hyponatremia.  Past Medical History:  Past Medical History  Diagnosis Date  . Paranoid schizophrenia   . Venous insufficiency of right lower extremity   . Hypertension    History reviewed. No pertinent past surgical history. Family History:  Family History  Problem Relation Age of Onset  . Atrial fibrillation Father    Social History:  History  Alcohol Use No     History  Drug Use No    History   Social History  . Marital Status: Single    Spouse Name: N/A  . Number of Children: N/A  . Years of Education: N/A   Social History Main Topics  . Smoking status: Current Every Day Smoker -- 1.00 packs/day    Types: Cigarettes  . Smokeless tobacco: Not on file  . Alcohol Use: No  . Drug Use: No  . Sexual Activity: Not on file   Other Topics Concern  . None   Social History Narrative   Additional Social History:                          Allergies:   Allergies  Allergen Reactions  . Phosphate Nausea And Vomiting  . Artane [Trihexyphenidyl] Other (See Comments)    unknown  . Codeine Other (See Comments)  unknown    Vitals: Blood pressure 126/100, pulse 100, temperature 98.8 F (37.1 C), temperature source Oral, resp. rate 23, height  (1.676 m), weight 141 lb 12.1 oz (64.3 kg), last menstrual period 12/29/2013, SpO2 94 %.  Risk to Self: Is patient at risk for suicide?: No Risk to Others:   Prior Inpatient Therapy:   Prior Outpatient Therapy:    Current Facility-Administered Medications  Medication Dose Route Frequency Provider Last Rate Last Dose  . acetaminophen (TYLENOL) tablet 650 mg  650 mg Oral Q6H PRN Eduard Clos, MD       Or  . acetaminophen (TYLENOL) suppository 650 mg  650 mg Rectal Q6H PRN Eduard Clos, MD      . albuterol (PROVENTIL) (2.5 MG/3ML) 0.083% nebulizer solution 2.5 mg  2.5 mg Nebulization Q2H PRN Eduard Clos, MD      . amLODipine (NORVASC) tablet 10 mg  10 mg Oral Daily Eduard Clos, MD   10 mg at 10/16/14 0947  . [START ON 10/20/2014] ARIPiprazole (ABILIFY) injection 9.75 mg  9.75 mg Intramuscular Q30 days Eduard Clos, MD      . benztropine (COGENTIN) tablet 1 mg  1 mg Oral Daily Eduard Clos, MD   1 mg at 10/16/14 0947  . enoxaparin (LOVENOX) injection 70 mg  70 mg Subcutaneous Q12H Jinger Neighbors, NP   70 mg at 10/15/14 1712  . hydrALAZINE (APRESOLINE) injection 10 mg  10 mg Intravenous Q4H PRN Eduard Clos, MD      . ipratropium-albuterol (DUONEB) 0.5-2.5 (3) MG/3ML nebulizer solution 3 mL  3 mL Nebulization TID Drema Dallas, MD   3 mL at 10/16/14 1540  . metoprolol tartrate (LOPRESSOR) tablet 25 mg  25 mg Oral BID Eduard Clos, MD   25 mg at 10/16/14 0947  . ondansetron (ZOFRAN) tablet 4 mg  4 mg Oral Q6H PRN Eduard Clos, MD       Or  . ondansetron [facility]) injection 4 mg  4 mg Intravenous Q6H PRN Eduard Clos, MD   4 mg at 10/15/14 1936  . sodium chloride 0.9 % injection 3 mL  3 mL Intravenous Q12H Eduard Clos, MD   3 mL at 10/16/14 1000    Musculoskeletal: Strength & Muscle Tone: decreased Gait & Station: unsteady Patient leans: N/A  Psychiatric Specialty Exam: Physical Exam  Review of Systems  Constitutional: Positive for malaise/fatigue.  Neurological: Positive for dizziness.  Psychiatric/Behavioral: Positive for hallucinations. The patient is nervous/anxious.     Blood pressure 126/100, pulse 100, temperature 98.8 F (37.1 C), temperature source Oral, resp. rate 23, height  (1.676 m), weight 141 lb 12.1 oz (64.3 kg), last menstrual period 12/29/2013, SpO2 94 %.Body mass index is  22.89 kg/(m^2).  General Appearance: Bizarre and Disheveled  Eye Contact::  Poor  Speech:  Garbled  Volume:  Normal  Mood:  Anxious and Dysphoric  Affect:  Labile  Thought Process:  Disorganized and Loose  Orientation:  Other:  Person and place  Thought Content:  Delusions and Rumination  Suicidal Thoughts:  No  Homicidal Thoughts:  No  Memory:  Immediate;   Poor Recent;   Poor Remote;   Poor  Judgement:  Impaired  Insight:  Lacking  Psychomotor Activity:  Restlessness  Concentration:  Poor  Recall:  Poor  Fund of Knowledge:Poor  Language: Poor  Akathisia:  No  Handed:  Right  AIMS (if indicated):     Assets:  Social Support  ADL's:  Impaired  Cognition: Impaired,  Moderate  Sleep:      Medical Decision Making: Established Problem, Worsening (2), Review of Last Therapy Session (1) and Review or order medicine tests (1)  Treatment Plan Summary: Daily contact with patient to assess and evaluate symptoms and progress in treatment and Medication management  Plan:  Recommend psychiatric Inpatient admission when medically cleared. Disposition: Patient will restart Risperdal 3 mg twice a day as she had improvement with this medication in the past. When necessary Ativan may be helpful for agitation. The patient is very disorganized and obviously not competent to manage her own affairs. Her poor compliance with medicine and excessive drinking indicated that she may need a higher level of care at discharge.  Tenny Craw [facility] 10/16/2014 4:23 PM

## 2014-10-17 DIAGNOSIS — F209 Schizophrenia, unspecified: Secondary | ICD-10-CM

## 2014-10-17 LAB — COMPREHENSIVE METABOLIC PANEL
ALBUMIN: 3.2 g/dL — AB (ref 3.5–5.2)
ALK PHOS: 87 U/L (ref 39–117)
ALT: 44 U/L — AB (ref 0–35)
AST: 22 U/L (ref 0–37)
Anion gap: 4 — ABNORMAL LOW (ref 5–15)
BILIRUBIN TOTAL: 0.6 mg/dL (ref 0.3–1.2)
BUN: 7 mg/dL (ref 6–23)
CO2: 38 mmol/L — ABNORMAL HIGH (ref 19–32)
Calcium: 8.4 mg/dL (ref 8.4–10.5)
Chloride: 90 mmol/L — ABNORMAL LOW (ref 96–112)
Creatinine, Ser: 0.51 mg/dL (ref 0.50–1.10)
GFR calc Af Amer: >90 mL/min (ref >90)
GFR calc non Af Amer: >90 mL/min (ref >90)
GLUCOSE: 110 mg/dL — AB (ref 70–99)
POTASSIUM: 3.9 mmol/L (ref 3.5–5.1)
SODIUM: 132 mmol/L — AB (ref 135–145)
Total Protein: 6 g/dL (ref 6.0–8.3)

## 2014-10-17 LAB — MAGNESIUM: Magnesium: 2.1 mg/dL (ref 1.5–2.5)

## 2014-10-17 MED ORDER — RISPERIDONE 2 MG PO TABS
2.0000 mg | ORAL_TABLET | Freq: Two times a day (BID) | ORAL | Status: DC
  Administered 2014-10-17 – 2014-10-21 (×8): 2 mg via ORAL
  Filled 2014-10-17 (×9): qty 1

## 2014-10-17 MED ORDER — METOPROLOL TARTRATE 50 MG PO TABS
50.0000 mg | ORAL_TABLET | Freq: Two times a day (BID) | ORAL | Status: DC
  Administered 2014-10-18 – 2014-10-21 (×8): 50 mg via ORAL
  Filled 2014-10-17 (×9): qty 1

## 2014-10-17 NOTE — Progress Notes (Addendum)
*  PRELIMINARY RESULTS* Vascular Ultrasound Lower extremity venous duplex has been completed.  Preliminary findings: Negative for acute DVT. Chronic strand noted in the right distal femoral vein. Enlarged inguinal lymph nodes are seen bilaterally. Varicose veins are noted in the right calf.   Farrel DemarkJill Eunice, RDMS, RVT  10/17/2014, 9:41 AM

## 2014-10-17 NOTE — Care Management Note (Signed)
    Page 1 of 1   10/20/2014     3:32:33 PM CARE MANAGEMENT NOTE 10/20/2014  Patient:  Nancy Erickson,Nancy Erickson   Account Number:  [id number]  Date Initiated:  10/17/2014  Documentation initiated by:  Gae GallopOLE,ANGELA  Subjective/Objective Assessment:   PTA from group home admitted with SOB.     Action/Plan:   Return to group home when medically stable.   Anticipated DC Date:  10/20/2014   Anticipated DC Plan:  GROUP HOME      DC Planning Services  CM consult      Choice offered to / List presented to:             Status of service:  Completed, signed off Medicare Important Message given?  YES (If response is "NO", the following Medicare IM given date fields will be blank) Date Medicare IM given:  10/17/2014 Medicare IM given by:  Gae GallopOLE,ANGELA Date Additional Medicare IM given:  10/20/2014 Additional Medicare IM given by:  Letha CapeEBORAH Taresa [place]  Discharge Disposition:  GROUP HOME  Per UR Regulation:  Reviewed for med. necessity/level of care/duration of stay  If discussed at Long Length of Stay Meetings, dates discussed:    Comments:  10/20/14 1530 Letha Capeeborah Kaylin Schellenberg RN, BSN [phone] Psych CSW and CSW working on dispo at Group home.  10/17/2014 @ 16:30 Gae GallopAngela Cole RN,BSN,CM Benefit check in progress, will make pt aware when complete.  10/17/2014 @11 :30 Gae GallopAngela Cole RN,BSN,CM Attempt made to speak with pt pt regarding  HH/ med needs, pt very droggy/ sleepy per CM. CM to f/u with disposition needs

## 2014-10-17 NOTE — Progress Notes (Signed)
Pt was transferred from 3S to 5W via bed. Report received. Pt is oriented x1 to self. No skin problems. On tele box #[id number]. On 4L O2 nasal cannula.  Doyne KeelParsons, Jill Stopka L 6:45 PM

## 2014-10-17 NOTE — Consult Note (Signed)
Psychiatry Consult Follow up  Reason for Consult:  Confusion, psychosis Referring Physician:  Dr. Joseph ArtWoods Patient Identification: Nancy Erickson MRN:  [id number] Principal Diagnosis: SOB (shortness of breath) Diagnosis:   Patient Active Problem List   Diagnosis Date Noted  . Cardiomyopathy [I42.9]   . Pulmonary hypertension [I27.0]   . Pulmonary embolus [I26.99]   . 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 Paranoid schizophrenia [F20.0]   . SOB (shortness of breath) [R06.02] 10/14/2014  . Hypertension, uncontrolled [I10] 10/14/2014  . Schizo-affective schizophrenia [F25.0] 10/12/2013  . Hyponatremia [E87.1] 10/12/2013  . Altered mental state [R41.82] 10/12/2013  . HTN (hypertension) [I10] 10/11/2013  . Hypertensive urgency [I10] 10/11/2013    Total Time spent with patient: 20 minutes  Subjective:   Nancy LandmarkMarilyn Roggenkamp is a 59 y.o. female patient admitted with shortness of breath  HPI: This patient is a 59 year old white female with a history of schizophrenia and hypertension. Apparently she was referred from her group home after having shortness of breath. She's a very poor historian and difficult to understand but apparently she lives in a group home and is getting help [facility]. In reviewing her records she's had very poor medication compliance and at times has overused medications and needed to be admitted for the consequences. Currently she is on injectable Abilify once a month as well as Cogentin. In the past she has been on Risperdal. Currently the patient has been drinking excessively, primarily diet soda ice tea and milk. She was severely hyponatremic on admission and this is improving with fluid restriction. She also has been diagnosed with a pulmonary embolism. Mental status she is extremely disorganized and difficult to understand due to dysarthria. She attempted to write down where she lives in her phone numbers but it was very garbled.  Her nurse reports that the patient repeats the same things over and over again, is responding to internal stimuli and talking to people who aren't there. She is anxious and has not been able to sleep for the last 24 hours. She cannot answer questions coherently.   10/17/14 Interval History: Patient seen, spoke with staff RN and chart reviewed along with Bonna GainsEmily, LCSW for psych consultation follow up for psychosis and confusion. Patient hyponatremia is getting better from 110 on admission to 132 today and has fluid restriction. She is also receiving medication Abilify, risperidone and cogentin. She is a poor historian and responds minimum verbally and sleeps off quickly. Will reduce her risperidone to avoid extra sedation and reassess tomorrow. Case manager will contact regarding her disposition plan and out patient psychiatric services. [facility].    Past Medical History:  Past Medical History  Diagnosis Date  . Paranoid schizophrenia   . Venous insufficiency of right lower extremity   . Hypertension    History reviewed. No pertinent past surgical history. Family History:  Family History  Problem Relation Age of Onset  . Atrial fibrillation Father    Social History:  History  Alcohol Use No     History  Drug Use No    History   Social History  . Marital Status: Single    Spouse Name: N/A  . Number of Children: N/A  . Years of Education: N/A   Social History Main Topics  . Smoking status: Current Every Day Smoker -- 1.00 packs/day    Types: Cigarettes  . Smokeless tobacco: Not on file  . Alcohol Use: No  . Drug  Use: No  . Sexual Activity: Not on file   Other Topics Concern  . None   Social History Narrative   Additional Social History:      Allergies:   Allergies  Allergen Reactions  . Phosphate Nausea And Vomiting  . Artane [Trihexyphenidyl] Other (See Comments)    unknown  . Codeine Other (See Comments)    unknown    Vitals:  Blood pressure 132/71, pulse 115, temperature 98.3 F (36.8 C), temperature source Axillary, resp. rate 24, height  (1.676 m), weight 65 kg (143 lb 4.8 oz), last menstrual period 12/29/2013, SpO2 100 %.  Risk to Self: Is patient at risk for suicide?: No Risk to Others:   Prior Inpatient Therapy:   Prior Outpatient Therapy:    Current Facility-Administered Medications  Medication Dose Route Frequency Provider Last Rate Last Dose  . acetaminophen (TYLENOL) tablet 650 mg  650 mg Oral Q6H PRN Eduard Clos, MD       Or  . acetaminophen (TYLENOL) suppository 650 mg  650 mg Rectal Q6H PRN Eduard Clos, MD      . albuterol (PROVENTIL) (2.5 MG/3ML) 0.083% nebulizer solution 2.5 mg  2.5 mg Nebulization Q2H PRN Eduard Clos, MD      . Melene Muller ON 10/20/2014] ARIPiprazole (ABILIFY) injection 9.75 mg  9.75 mg Intramuscular Q30 days Eduard Clos, MD      . benztropine (COGENTIN) tablet 1 mg  1 mg Oral Daily Eduard Clos, MD   1 mg at 10/16/14 0947  . enoxaparin (LOVENOX) injection 70 mg  70 mg Subcutaneous Q12H Jinger Neighbors, NP   70 mg at 10/17/14 0522  . hydrALAZINE (APRESOLINE) injection 10 mg  10 mg Intravenous Q4H PRN Eduard Clos, MD      . ipratropium-albuterol (DUONEB) 0.5-2.5 (3) MG/3ML nebulizer solution 3 mL  3 mL Nebulization TID Drema Dallas, MD   3 mL at 10/17/14 0755  . isosorbide mononitrate (ISMO,MONOKET) tablet 10 mg  10 mg Oral BID WC Drema Dallas, MD      . LORazepam (ATIVAN) tablet 1 mg  1 mg Oral Q4H PRN Myrlene Broker, MD   1 mg at 10/17/14 0519  . metoprolol tartrate (LOPRESSOR) tablet 25 mg  25 mg Oral BID Drema Dallas, MD   25 mg at 10/16/14 2120  . ondansetron (ZOFRAN) tablet 4 mg  4 mg Oral Q6H PRN Eduard Clos, MD       Or  . ondansetron [facility]) injection 4 mg  4 mg Intravenous Q6H PRN Eduard Clos, MD   4 mg at 10/15/14 1936  . potassium chloride SA (K-DUR,KLOR-CON) CR tablet 40 mEq  40 mEq Oral BID Drema Dallas, MD   40 mEq at 10/16/14 1759  . risperiDONE (RISPERDAL) tablet 3 mg  3 mg Oral BID Myrlene Broker, MD   3 mg at 10/16/14 2121  . sodium chloride 0.9 % injection 3 mL  3 mL Intravenous Q12H Eduard Clos, MD   3 mL at 10/16/14 2121    Musculoskeletal: Strength & Muscle Tone: decreased Gait & Station: unsteady Patient leans: N/A  Psychiatric Specialty Exam: Physical Exam  Review of Systems  Constitutional: Positive for malaise/fatigue.  Neurological: Positive for dizziness.  Psychiatric/Behavioral: Positive for hallucinations. The patient is nervous/anxious.     Blood pressure 132/71, pulse 115, temperature 98.3 F (36.8 C), temperature source Axillary, resp. rate 24, height  (1.676 m), weight 65 kg (  143 lb 4.8 oz), last menstrual period 12/29/2013, SpO2 100 %.Body mass index is 23.14 kg/(m^2).  General Appearance: Bizarre and Disheveled  Eye Contact::  Poor  Speech:  Garbled  Volume:  Normal  Mood:  Anxious and Dysphoric  Affect:  Labile  Thought Process:  Disorganized and Loose  Orientation:  Other:  Person and place  Thought Content:  Delusions and Rumination  Suicidal Thoughts:  No  Homicidal Thoughts:  No  Memory:  Immediate;   Poor Recent;   Poor Remote;   Poor  Judgement:  Impaired  Insight:  Lacking  Psychomotor Activity:  Restlessness  Concentration:  Poor  Recall:  Poor  Fund of Knowledge:Poor  Language: Poor  Akathisia:  No  Handed:  Right  AIMS (if indicated):     Assets:  Social Support  ADL's:  Impaired  Cognition: Impaired,  Moderate  Sleep:      Medical Decision Making: Established Problem, Worsening (2), Review of Last Therapy Session (1) and Review or order medicine tests (1)  Treatment Plan Summary: Daily contact with patient to assess and evaluate symptoms and progress in treatment and Medication management  Plan: Patient continue Ativan 1 mg PO Q4H PRN for anxiety and insomnia Decrease Risperidone 2 mg PO BID to reduce  sedation Continue Cogentin 1 mg PO QD for EPS Will contact care taker regarding out patient medication management including long acting Abilify Recommend psychiatric Inpatient admission when medically cleared.   Disposition:  The patient is very disorganized and obviously not competent to manage her own affairs. Her poor compliance with medicine and excessive drinking indicated that she may need a higher level of care at discharge.  Jhene Westmoreland,JANARDHAHA R. 10/17/2014 8:45 AM

## 2014-10-17 NOTE — Clinical Social Work Psych Assess (Signed)
Covering Psych CSW and Psych MD attempted to meet with patient at bedside. Patient with limited to communication with whisper and only capable of answering questions to yes / no. Unclear if patient responses reliable. Covering Psych CSW spoke with patient's father, Ludwig ClarksHarold Roach, regarding patient.   Living Arrangements Per patient's father, [facility] where patient is living independently. Patient's father denied that [facility] is a [facility], stating the facility is a "step below group home." Patient's father reports patient has own apartment where patient receives no assistance with cooking, cleaning, or taking medications. Patient's father stated [facility] been informing patient's family that patient does not meet criteria for their level of care and the facility is currently recommending 24 hour care/supervision for patient.  Community Assistance Patient's father reports [facility] is active with [facility] and has a "sub ACT" team, stating patient only receives a portion of ACT Team benefits. Per patient's father, patient is seen by an Charity fundraiserN from [facility] five days a week for 3 hours per day.  Baseline Patient's father reports patient is limited with communication at baseline, stating patient typically answers with yes / no and other communication is "babbled." Patient's father reports patient's behaviors at home as showering with patient's clothes on, starting a load of laundry only to remove it after two minutes of washing and placing in the dryer, and walking around outside of patient's apartment with no clothes on. Per patient's father, patient is "obsessed" with drinking milk, sweet tea, and diet cola. Patient's father states patient averages 15 cans of soda in one day. Patient's father reports patient is independent with ambulating.   Medication History Patient's father expressed concern regarding patient's psychitropic medication. Patient's father  reported patient stopped taking Risperidone in February of 2015 and was then placed on PO Abilify. Per patient's father, patient was not taking Abilify but rather throwing it away or selling the medication. Patient's father reports patient was then prescribed Abilify injections every 30 days. Next dosage is for Thursday, October 20, 2014. Patient's father informed covering Psych CSW patient's family does not believe patient's medication is lasting a full 30 days as patient's behaviors decline towards the end of the cycle of medication. Patient's father reports patient's best behavior and baseline is when patient received a double dose of Cogentin and 30 day Abilify injection. Patient's father was unable to recall exact dosage of "double dose of Cogentin." Patient's father expressed concern for patient stating patient's father is "doubtful for a cure for her."  Covering Psych CSW to continue to follow and assist with patient's care as needed.  Marcelline Deistmily Connelly Spruell, LCSWA [phone]) Licensed Clinical Social Worker Orthopedics [phone]) and Surgical [phone])

## 2014-10-17 NOTE — Progress Notes (Signed)
Medicare Important Message given? YES (If response is "NO", the following Medicare IM given date fields will be blank) Date Medicare IM given:10/17/2014 Medicare IM given by: Alithea Lapage 

## 2014-10-17 NOTE — Progress Notes (Addendum)
Shift event: pt with 10 beats of Vtach. Pt asymptomatic, other VSS.  Mag checked normal. EKG pending  Nancy BoroughsS Cleone Hulick [facility]  [facility]

## 2014-10-17 NOTE — Progress Notes (Signed)
[place] TEAM 1 - Stepdown/ICU TEAM Progress Note  Nancy Erickson ZOX:[id number] DOB: 10-09-1955 DOA: 10/14/2014 PCP: Irving Copas, MD  Admit HPI / Brief Narrative: 59 y.o. F Hx paranoid schizophrenia and hypertension who was referred from her group home after having shortness of breath. In the ER patient's blood work noted severe hyponatremia with sodium of 110. Her father reported she drinks almost a gallon of milk and a dozen cans of soda daily. Patient also smokes cigarettes as per patient's father. Does not drink alcohol.   HPI/Subjective: Pt giggles inappropriately, and does not give direct answers to my questions.  No family present at time of exam.    Assessment/Plan:  Severe Hyponatremia -psychogenic polydipsia - Patient's father states that patient drinks a gallon of milk and at least a dozen of canned soda daily along with ice tea.  -improving nicely w/ fluid restriction  Pulmonary embolism -Continue Lovenox 70 mg BID -CM to investigate cost of different oral agents so she can be transitioned tomorrow. -Echocardiogram; see results below -Lower extremity Dopplers w/o evidence of DVT  -w/ no clear inciting event will need to complete hypercoag w/u once off anticoag (6 months)  Hypokalemia -corrected  Hypertension uncontrolled -BP much better controlled today - follow   Lower extremity edema with venous congestion stasis dermatitis -Dopplers of the lower extremities w/o DVT - suspect this is volume related in polydipsia + some mild R heart strain / RV failure due to PE   Paranoid schizophrenia  -meds per Psych consult - per Psych MD "Recommend psychiatric Inpatient admission when medically cleared."  Code Status: FULL Family Communication: no family present at time of exam Disposition Plan: trend Na w/ volume restriction - determine best agent financially for oral anticaog transition - will need placement in inpt psych facility   Consultants: Psych    Procedure/Significant Events: 3/12 CT angiogram PE protocol- Suspect subsegmental pulmonary emboli to LLL. -Multi chamber cardiomegaly. -Enlargement of the main pulmonary artery measuring up to 3.4 cm. -Moderate hiatal hernia with dilated esophagus, small fluid level. - Multinodular goiter, largest thyroid nodule measures 11 mm. 3/12 echocardiogram;- LVEF= 60%- 65%. - Lt/Rt atrium: mildly to moderately dilated. - Right ventricle: The cavity size was dilated.  - Pulmonary arteries:PA peak pressure: 46 mm Hg (S).   Antibiotics: NA   DVT prophylaxis: Lovenox  Objective: Blood pressure 124/65, pulse 105, temperature 98.5 F (36.9 C), temperature source Axillary, resp. rate 22, height  (1.676 m), weight 65 kg (143 lb 4.8 oz), last menstrual period 12/29/2013, SpO2 93 %.  Intake/Output Summary (Last 24 hours) at 10/17/14 1518 Last data filed at 10/17/14 1510  Gross per 24 hour  Intake    340 ml  Output   2950 ml  Net  -2610 ml   Exam: General:  No acute respiratory distress Lungs: Clear to auscultation bilaterally without wheezes or crackles Cardiovascular: Regular rate and rhythm without murmur gallop or rub Abdomen: Nontender, nondistended, soft, bowel sounds positive, no rebound, no ascites, no appreciable mass Extremities: No significant cyanosis, clubbing, or edema bilateral lower extremities today   Data Reviewed: Basic Metabolic Panel:  Recent Labs Lab 10/14/14 1918 10/15/14 10/15/14 0350 10/15/14 0810 10/17/14 0329  NA 113* 118* 120* 124* 132*  K 4.0 3.5 3.6 3.2* 3.9  CL 74* 77* 80* 82* 90*  CO2 29 32 33* 37* 38*  GLUCOSE 100* 97 85 114* 110*  BUN <5* <5* <5* <5* 7  CREATININE 0.36* 0.41* 0.40* 0.47* 0.51  CALCIUM 8.8 8.4  8.5 8.1* 8.4  MG  --   --   --   --  2.1   Liver Function Tests:  Recent Labs Lab 10/14/14 1519 10/17/14 0329  AST 47* 22  ALT 67* 44*  ALKPHOS 113 87  BILITOT 0.8 0.6  PROT 6.5 6.0  ALBUMIN 3.7 3.2*    Recent Labs Lab  10/14/14 1519  LIPASE 39   CBC:  Recent Labs Lab 10/14/14 1453 10/15/14 0350  WBC 10.1 10.6*  NEUTROABS  --  8.2*  HGB 14.6 14.7  HCT 40.8 41.5  MCV 83.4 84.0  PLT 360 397   Cardiac Enzymes:  Recent Labs Lab 10/14/14 1519 10/14/14 2102 10/15/14 0240 10/15/14 0810  TROPONINI <0.03 <0.03 <0.03 <0.03    Recent Results (from the past 240 hour(s))  MRSA PCR Screening     Status: None   Collection Time: 10/14/14  8:00 PM  Result Value Ref Range Status   MRSA by PCR NEGATIVE NEGATIVE Final    Comment:        The GeneXpert MRSA Assay (FDA approved for NASAL specimens only), is one component of a comprehensive MRSA colonization surveillance program. It is not intended to diagnose MRSA infection nor to guide or monitor treatment for MRSA infections.      Studies:  Recent x-ray studies have been reviewed in detail by the Attending Physician  Scheduled Meds:  Scheduled Meds: . [START ON 10/20/2014] ARIPiprazole  9.75 mg Intramuscular Q30 days  . benztropine  1 mg Oral Daily  . enoxaparin (LOVENOX) injection  70 mg Subcutaneous Q12H  . isosorbide mononitrate  10 mg Oral BID WC  . metoprolol tartrate  25 mg Oral BID  . potassium chloride  40 mEq Oral BID  . risperiDONE  2 mg Oral BID  . sodium chloride  3 mL Intravenous Q12H    Time spent on care of this patient: 35 mins  Lonia BloodJeffrey T. Tamaira Ciriello, MD [facility] For Consults/Admissions - Flow Manager - [phone] Office  [phone]  Contact MD directly via text page:      [email or web address]      password [email or web address]  10/17/2014, 3:18 PM   LOS: 3 days

## 2014-10-17 NOTE — Progress Notes (Signed)
Received orders to transfer pt to tele unit.  Report called to RN prior to transfer.  Patient transferred to 5W via bed.  Vital signs stable and no s/s of acute distress.

## 2014-10-18 DIAGNOSIS — F54 Psychological and behavioral factors associated with disorders or diseases classified elsewhere: Secondary | ICD-10-CM

## 2014-10-18 DIAGNOSIS — R631 Polydipsia: Secondary | ICD-10-CM

## 2014-10-18 LAB — BASIC METABOLIC PANEL
ANION GAP: 11 (ref 5–15)
BUN: 19 mg/dL (ref 6–23)
CHLORIDE: 91 mmol/L — AB (ref 96–112)
CO2: 30 mmol/L (ref 19–32)
Calcium: 8.5 mg/dL (ref 8.4–10.5)
Creatinine, Ser: 0.73 mg/dL (ref 0.50–1.10)
GFR calc non Af Amer: >90 mL/min (ref >90)
Glucose, Bld: 94 mg/dL (ref 70–99)
Potassium: 4.3 mmol/L (ref 3.5–5.1)
SODIUM: 132 mmol/L — AB (ref 135–145)

## 2014-10-18 LAB — MAGNESIUM
MAGNESIUM: 2 mg/dL (ref 1.5–2.5)
Magnesium: 0.1 mg/dL — CL (ref 1.5–2.5)

## 2014-10-18 MED ORDER — ARIPIPRAZOLE ER 400 MG IM SUSR
400.0000 mg | INTRAMUSCULAR | Status: DC
  Administered 2014-10-18: 400 mg via INTRAMUSCULAR

## 2014-10-18 MED ORDER — ARIPIPRAZOLE ER 400 MG IM SUSR: 400.0000 mg | Freq: Once | INTRAMUSCULAR | Status: DC

## 2014-10-18 NOTE — Progress Notes (Signed)
CRITICAL VALUE ALERT  Critical value received:  Mag 0.1  Date of notification:  10/18/14  Time of notification:  0703  Critical value read back: yes Nurse who received alert:  Louie BunFelicia Eduar Kumpf, RN  MD notified (1st page):  Dr Susann Givenseswin  Time of first page:  0712  MD notified (2nd page):  Time of second page:  Responding MD:  Dr Susann Givenseswin  Time MD responded:  [phone]

## 2014-10-18 NOTE — Clinical Social Work Psych Note (Signed)
Covering Psych CSW [facility] medically [facility] for [facility] to [facility] Psychiatric facility. Covering Psych CSW attempted to complete referral, however, MD documentation reflects that patient is not medically stable as of 10/18/2014. Referral not accepted on 10/18/2014 as patient not medically stable per medical team documentation.  Psych CSW to continue to follow and assist with discharge planning once patient medically stable for discharge.  Marcelline Deistmily Libero Puthoff, LCSWA [phone]) Licensed Clinical Social Worker Orthopedics [phone]) and Surgical [phone]([id number])

## 2014-10-18 NOTE — Progress Notes (Addendum)
[place] TEAM 1 - Stepdown/ICU TEAM Progress Note  Rush LandmarkMarilyn Mizer ZOX:[id number] DOB: [birth year] DOA: 10/14/2014 PCP: Irving CopasHACKER,ROBERT KELLER, MD  Admit HPI / Brief Narrative: 59 y.o. F Hx paranoid schizophrenia and hypertension who was referred from her group home after having shortness of breath. In the ER patient's blood work noted severe hyponatremia with sodium of 110. Her father reported she drinks almost a gallon of milk and a dozen cans of soda daily. Patient also smokes cigarettes as per patient's father. Does not drink alcohol.   HPI/Subjective: Pt has no complaints- sitting up in bed eating  Assessment/Plan:  Severe Hyponatremia -psychogenic polydipsia - Patient's father states that patient drinks a gallon of milk and at least a dozen of canned soda daily along with ice tea.  -improving nicely w/ fluid restriction  Paranoid schizophrenia  -meds per Psych consult - per Psych MD "Recommend psychiatric Inpatient admission when medically cleared."  Pulmonary embolism -Continue Lovenox 70 mg BID -CM to investigate cost of different oral agents so she can be transitioned tomorrow. -Echocardiogram; see results below -Lower extremity Dopplers w/o evidence of DVT  -w/ no clear inciting event will need to complete hypercoag w/u once off anticoag (6 months)  Hypokalemia -corrected  Hypertension uncontrolled -BP much better controlled today - follow   Lower extremity edema with venous congestion stasis dermatitis -Dopplers of the lower extremities w/o DVT - suspect this is volume related in polydipsia + some mild R heart strain / RV failure due to PE    Code Status: FULL Family Communication: no family present at time of exam Disposition Plan: trend Na w/ volume restriction - determine best agent financially for oral anticaog transition - will need placement in inpt psych facility   Consultants: Psych   Procedure/Significant Events: 3/12 CT angiogram PE protocol-  Suspect subsegmental pulmonary emboli to LLL. -Multi chamber cardiomegaly. -Enlargement of the main pulmonary artery measuring up to 3.4 cm. -Moderate hiatal hernia with dilated esophagus, small fluid level. - Multinodular goiter, largest thyroid nodule measures 11 mm. 3/12 echocardiogram;- LVEF= 60%- 65%. - Lt/Rt atrium: mildly to moderately dilated. - Right ventricle: The cavity size was dilated.  - Pulmonary arteries:PA peak pressure: 46 mm Hg (S).   Antibiotics: NA   DVT prophylaxis: Lovenox  Objective: Blood pressure 129/65, pulse 84, temperature 98.7 F (37.1 C), temperature source Oral, resp. rate 18, height 5\' 6"  (1.676 m), weight 64 kg (141 lb 1.5 oz), last menstrual period 12/29/2013, SpO2 97 %.  Intake/Output Summary (Last 24 hours) at 10/18/14 1106 Last data filed at 10/18/14 0934  Gross per 24 hour  Intake   1323 ml  Output   1650 ml  Net   -327 ml   Exam: General:  No acute respiratory distress Lungs: Clear to auscultation bilaterally without wheezes or crackles Cardiovascular: Regular rate and rhythm without murmur gallop or rub Abdomen: Nontender, nondistended, soft, bowel sounds positive, no rebound, no ascites, no appreciable mass Extremities: No significant cyanosis, clubbing, or edema bilateral lower extremities today   Data Reviewed: Basic Metabolic Panel:  Recent Labs Lab 10/15/14 10/15/14 0350 10/15/14 0810 10/17/14 0329 10/18/14 0555 10/18/14 0815  NA 118* 120* 124* 132* 132*  --   K 3.5 3.6 3.2* 3.9 4.3  --   CL 77* 80* 82* 90* 91*  --   CO2 32 33* 37* 38* 30  --   GLUCOSE 97 85 114* 110* 94  --   BUN <5* <5* <5* 7 19  --   CREATININE 0.41*  0.40* 0.47* 0.51 0.73  --   CALCIUM 8.4 8.5 8.1* 8.4 8.5  --   MG  --   --   --  2.1 0.1* 2.0   Liver Function Tests:  Recent Labs Lab 10/14/14 1519 10/17/14 0329  AST 47* 22  ALT 67* 44*  ALKPHOS 113 87  BILITOT 0.8 0.6  PROT 6.5 6.0  ALBUMIN 3.7 3.2*    Recent Labs Lab 10/14/14 1519    LIPASE 39   CBC:  Recent Labs Lab 10/14/14 1453 10/15/14 0350  WBC 10.1 10.6*  NEUTROABS  --  8.2*  HGB 14.6 14.7  HCT 40.8 41.5  MCV 83.4 84.0  PLT 360 397   Cardiac Enzymes:  Recent Labs Lab 10/14/14 1519 10/14/14 2102 10/15/14 0240 10/15/14 0810  TROPONINI <0.03 <0.03 <0.03 <0.03    Recent Results (from the past 240 hour(s))  MRSA PCR Screening     Status: None   Collection Time: 10/14/14  8:00 PM  Result Value Ref Range Status   MRSA by PCR NEGATIVE NEGATIVE Final    Comment:        The GeneXpert MRSA Assay (FDA approved for NASAL specimens only), is one component of a comprehensive MRSA colonization surveillance program. It is not intended to diagnose MRSA infection nor to guide or monitor treatment for MRSA infections.      Studies:  Recent x-ray studies have been reviewed in detail by the Attending Physician  Scheduled Meds:  Scheduled Meds: . [START ON 10/20/2014] ARIPiprazole  9.75 mg Intramuscular Q30 days  . benztropine  1 mg Oral Daily  . enoxaparin (LOVENOX) injection  70 mg Subcutaneous Q12H  . isosorbide mononitrate  10 mg Oral BID WC  . metoprolol tartrate  50 mg Oral BID  . risperiDONE  2 mg Oral BID  . sodium chloride  3 mL Intravenous Q12H    Time spent on care of this patient: 35 mins  Calvert Cantor, MD [facility] For Consults/Admissions - Flow Manager - [phone] Office  [phone]  Contact MD directly via text page:      [email or web address]      password [facility]  10/18/2014, 11:06 AM   LOS: 4 days

## 2014-10-18 NOTE — Consult Note (Signed)
Psychiatry Consult Follow up  Reason for Consult:  Confusion, psychosis Referring Physician:  Dr. Joseph Art Patient Identification: Nancy Erickson MRN:  [id number] Principal Diagnosis: SOB (shortness of breath) Diagnosis:   Patient Active Problem List   Diagnosis Date Noted  . Cardiomyopathy [I42.9]   . Pulmonary hypertension [I27.0]   . Pulmonary embolus [I26.99]   . 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 Paranoid schizophrenia [F20.0]   . SOB (shortness of breath) [R06.02] 10/14/2014  . Hypertension, uncontrolled [I10] 10/14/2014  . Schizo-affective schizophrenia [F25.0] 10/12/2013  . Hyponatremia [E87.1] 10/12/2013  . Altered mental state [R41.82] 10/12/2013  . HTN (hypertension) [I10] 10/11/2013  . Hypertensive urgency [I10] 10/11/2013    Total Time spent with patient: 20 minutes  Subjective:   Nancy Erickson is a 59 y.o. female patient admitted with shortness of breath  HPI: This patient is a 59 year old white female with a history of schizophrenia and hypertension. Apparently she was referred from her group home after having shortness of breath. She's a very poor historian and difficult to understand but apparently she lives in a group home and is getting help through an ACT team at [facility]. In reviewing her records she's had very poor medication compliance and at times has overused medications and needed to be admitted for the consequences. Currently she is on injectable Abilify once a month as well as Cogentin. In the past she has been on Risperdal. Currently the patient has been drinking excessively, primarily diet soda ice tea and milk. She was severely hyponatremic on admission and this is improving with fluid restriction. She also has been diagnosed with a pulmonary embolism. Mental status she is extremely disorganized and difficult to understand due to dysarthria. She attempted to write down where she lives in her phone numbers but it was very garbled.  Her nurse reports that the patient repeats the same things over and over again, is responding to internal stimuli and talking to people who aren't there. She is anxious and has not been able to sleep for the last 24 hours. She cannot answer questions coherently.   10/17/14 Interval History: Patient seen, spoke with staff RN and chart reviewed along with Bonna Gains for psych consultation follow up for psychosis and confusion. Patient hyponatremia is getting better from 110 on admission to 132 today and has fluid restriction. She is also receiving medication Abilify, risperidone and cogentin. She is a poor historian and responds minimum verbally and sleeps off quickly. Will reduce her risperidone to avoid extra sedation and reassess tomorrow. Case manager will contact regarding her disposition plan and out patient psychiatric services. She has no known psych admission at [facility].   As per LCSW communication: Patient's father expressed concern regarding patient's psychitropic medication. Patient's father reported patient stopped taking Risperidone in February of 2015 and was then placed on PO Abilify. Per patient's father, patient was not taking Abilify but rather throwing it away or selling the medication. Patient's father reports patient was then prescribed Abilify injections every 30 days. Next dosage is for Thursday, October 20, 2014. Patient's father informed covering Psych CSW patient's family does not believe patient's medication is lasting a full 30 days as patient's behaviors decline towards the end of the cycle of medication. Patient's father reports patient's best behavior and baseline is when patient received a double dose of Cogentin and 30 day Abilify injection. Patient's father was unable to recall exact dosage of "double dose of Cogentin." Patient's father expressed concern for patient stating patient's father  is "doubtful for a cure for her."   Past Medical History:  Past Medical History  Diagnosis  Date  . Paranoid schizophrenia   . Venous insufficiency of right lower extremity   . Hypertension    History reviewed. No pertinent past surgical history. Family History:  Family History  Problem Relation Age of Onset  . Atrial fibrillation Father    Social History:  History  Alcohol Use No     History  Drug Use No    History   Social History  . Marital Status: Single    Spouse Name: N/A  . Number of Children: N/A  . Years of Education: N/A   Social History Main Topics  . Smoking status: Current Every Day Smoker -- 1.00 packs/day    Types: Cigarettes  . Smokeless tobacco: Not on file  . Alcohol Use: No  . Drug Use: No  . Sexual Activity: Not on file   Other Topics Concern  . None   Social History Narrative   Additional Social History:      Allergies:   Allergies  Allergen Reactions  . Phosphate Nausea And Vomiting  . Artane [Trihexyphenidyl] Other (See Comments)    unknown  . Codeine Other (See Comments)    unknown    Vitals: Blood pressure 114/64, pulse 72, temperature 98.8 F (37.1 C), temperature source Oral, resp. rate 18, height  (1.676 m), weight 64 kg (141 lb 1.5 oz), last menstrual period 12/29/2013, SpO2 94 %.  Risk to Self: Is patient at risk for suicide?: No Risk to Others:   Prior Inpatient Therapy:   Prior Outpatient Therapy:    Current Facility-Administered Medications  Medication Dose Route Frequency Provider Last Rate Last Dose  . acetaminophen (TYLENOL) tablet 650 mg  650 mg Oral Q6H PRN Eduard Clos, MD       Or  . acetaminophen (TYLENOL) suppository 650 mg  650 mg Rectal Q6H PRN Eduard Clos, MD      . albuterol (PROVENTIL) (2.5 MG/3ML) 0.083% nebulizer solution 2.5 mg  2.5 mg Nebulization Q2H PRN Eduard Clos, MD      . ARIPiprazole SUSR 400 mg  400 mg Intramuscular Q28 days Calvert Cantor, MD      . benztropine (COGENTIN) tablet 1 mg  1 mg Oral Daily Eduard Clos, MD   1 mg at 10/18/14 0935  .  enoxaparin (LOVENOX) injection 70 mg  70 mg Subcutaneous Q12H Jinger Neighbors, NP   70 mg at 10/18/14 1610  . hydrALAZINE (APRESOLINE) injection 10 mg  10 mg Intravenous Q4H PRN Eduard Clos, MD      . isosorbide mononitrate (ISMO,MONOKET) tablet 10 mg  10 mg Oral BID WC Drema Dallas, MD   10 mg at 10/18/14 0935  . LORazepam (ATIVAN) tablet 1 mg  1 mg Oral Q4H PRN Myrlene Broker, MD   1 mg at 10/18/14 1555  . metoprolol (LOPRESSOR) tablet 50 mg  50 mg Oral BID Lonia Blood, MD   50 mg at 10/18/14 0935  . ondansetron (ZOFRAN) tablet 4 mg  4 mg Oral Q6H PRN Eduard Clos, MD       Or  . ondansetron [facility]) injection 4 mg  4 mg Intravenous Q6H PRN Eduard Clos, MD   4 mg at 10/15/14 1936  . risperiDONE (RISPERDAL) tablet 2 mg  2 mg Oral BID Leata Mouse, MD   2 mg at 10/18/14 0935  . sodium chloride 0.9 %  injection 3 mL  3 mL Intravenous Q12H Eduard ClosArshad N Kakrakandy, MD   3 mL at 10/18/14 [id number]    Musculoskeletal: Strength & Muscle Tone: decreased Gait & Station: unsteady Patient leans: N/A  Psychiatric Specialty Exam: Physical Exam  Review of Systems  Constitutional: Positive for malaise/fatigue.  Neurological: Positive for dizziness.  Psychiatric/Behavioral: Positive for hallucinations. The patient is nervous/anxious.     Blood pressure 114/64, pulse 72, temperature 98.8 F (37.1 C), temperature source Oral, resp. rate 18, height 5\' 6"  (1.676 m), weight 64 kg (141 lb 1.5 oz), last menstrual period 12/29/2013, SpO2 94 %.Body mass index is 22.78 kg/(m^2).  General Appearance: Bizarre and Disheveled  Eye Contact::  Poor  Speech:  Garbled  Volume:  Normal  Mood:  Anxious and Dysphoric  Affect:  Labile  Thought Process:  Disorganized and Loose  Orientation:  Other:  Person and place  Thought Content:  Delusions and Rumination  Suicidal Thoughts:  No  Homicidal Thoughts:  No  Memory:  Immediate;   Poor Recent;   Poor Remote;   Poor  Judgement:  Impaired   Insight:  Lacking  Psychomotor Activity:  Restlessness  Concentration:  Poor  Recall:  Poor  Fund of Knowledge:Poor  Language: Poor  Akathisia:  No  Handed:  Right  AIMS (if indicated):     Assets:  Social Support  ADL's:  Impaired  Cognition: Impaired,  Moderate  Sleep:      Medical Decision Making: Established Problem, Worsening (2), Review of Last Therapy Session (1) and Review or order medicine tests (1)  Treatment Plan Summary: Daily contact with patient to assess and evaluate symptoms and progress in treatment and Medication management  Plan: Patient continue Ativan 1 mg PO Q4H PRN for anxiety and insomnia Continuee Risperidone 2 mg PO BID to reduce sedation Continue Cogentin 1 mg PO QD for EPS Will contact care taker regarding out patient medication management including long acting Abilify Recommend psychiatric Inpatient admission when medically cleared.   Disposition:  The patient is very disorganized and obviously not competent to manage her own affairs. Her poor compliance with medicine and excessive drinking indicated that she may need a higher level of care at discharge.  Yarely Bebee,JANARDHAHA R. 10/18/2014 3:57 PM

## 2014-10-19 LAB — BASIC METABOLIC PANEL
Anion gap: 9 (ref 5–15)
BUN: 6 mg/dL (ref 6–23)
CO2: 36 mmol/L — AB (ref 19–32)
CREATININE: 0.52 mg/dL (ref 0.50–1.10)
Calcium: 8.9 mg/dL (ref 8.4–10.5)
Chloride: 88 mmol/L — ABNORMAL LOW (ref 96–112)
GFR calc Af Amer: >90 mL/min (ref >90)
GLUCOSE: 89 mg/dL (ref 70–99)
Potassium: 4.2 mmol/L (ref 3.5–5.1)
SODIUM: 133 mmol/L — AB (ref 135–145)

## 2014-10-19 MED ORDER — RIVAROXABAN 20 MG PO TABS: 20.0000 mg | ORAL_TABLET | Freq: Every day | ORAL | Status: DC

## 2014-10-19 MED ORDER — RIVAROXABAN (XARELTO) EDUCATION KIT FOR DVT/PE PATIENTS
PACK | Freq: Once | Status: DC
  Filled 2014-10-19: qty 1

## 2014-10-19 MED ORDER — RIVAROXABAN 15 MG PO TABS
15.0000 mg | ORAL_TABLET | Freq: Two times a day (BID) | ORAL | Status: DC
  Administered 2014-10-19 – 2014-10-21 (×4): 15 mg via ORAL
  Filled 2014-10-19 (×6): qty 1

## 2014-10-19 MED ORDER — RIVAROXABAN 20 MG PO TABS
20.0000 mg | ORAL_TABLET | Freq: Every day | ORAL | Status: DC
Start: 2014-10-19 — End: 2014-10-19
  Filled 2014-10-19: qty 1
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
## 2014-10-19 NOTE — Discharge Instructions (Signed)

## 2014-10-19 NOTE — Clinical Social Work Note (Signed)
CSW has discussed case with psych CSW and has reviewed psych CSW's note. Psych CSW and unit CSW will work to find a safe appropriate disposition for the patient.    Roddie McBryant Mathilda Maguire MSW, WilderLCSWA, [place], [phone]

## 2014-10-19 NOTE — Progress Notes (Signed)
Suspecting psychogenic polydipsia. Sodium increasing by fluid restriction. Placed on Xarelto for PE- covered under her medicaid. Would ask staff at her facility to give her meds to her. She will go to pscy inpatient from here.  Medically stable to d/c to psych inpatient facility.   Full note to follow.    Calvert CantorSaima Briggitte Boline, MD

## 2014-10-19 NOTE — Progress Notes (Signed)
Nancy Erickson  Nancy Erickson OVZ:[id number] DOB: [birth year] DOA: 10/14/2014 PCP: Irving Copas, Nancy Erickson  Admit HPI / Brief Narrative: 59 y.o. F Hx paranoid schizophrenia and hypertension who was referred from her group home after having shortness of breath. In the ER patient's blood work noted severe hyponatremia with sodium of 110. Her father reported she drinks almost a gallon of milk and a dozen cans of soda daily. Patient also smokes cigarettes as per patient's father. Does not drink alcohol.   HPI/Subjective: Quite restless this AM and had to be brought up to the nursing station. Affect is still odd.   Assessment/Plan:  Severe Hyponatremia -psychogenic polydipsia - Patient's father states that patient drinks a gallon of milk and at least a dozen of canned soda daily along with ice tea.  -improving nicely w/ fluid restriction  Paranoid schizophrenia  -meds per Psych consult - per Psych Nancy Erickson "Recommend psychiatric Inpatient admission when medically cleared." but now states that she is at her baseline and can be discharged - needs to be monitored 24 hrs- ALF would be appropriate- SW working on disposition  Pulmonary embolism transitioned from Lovenox 70 mg BID to Xarelto which is covered under her plan -Echocardiogram; see results below -Lower extremity Dopplers w/o evidence of DVT  -w/ no clear inciting event will need to complete hypercoag w/u once off anticoag (6 months)  Hypokalemia -corrected  Hypertension uncontrolled -BP much better controlled today - follow   Lower extremity edema with venous congestion stasis dermatitis -Dopplers of the lower extremities w/o DVT - suspect this is volume related in polydipsia + some mild R heart strain / RV failure due to PE    Code Status: FULL Family Communication: no family present at time of exam Disposition Plan: will need to go to a facility where meds can be given to her and fluid  restriction can be maintained  Consultants: Psych   Procedure/Significant Events: 3/12 CT angiogram PE protocol- Suspect subsegmental pulmonary emboli to LLL. -Multi chamber cardiomegaly. -Enlargement of the main pulmonary artery measuring up to 3.4 cm. -Moderate hiatal hernia with dilated esophagus, small fluid level. - Multinodular goiter, largest thyroid nodule measures 11 mm. 3/12 echocardiogram;- LVEF= 60%- 65%. - Lt/Rt atrium: mildly to moderately dilated. - Right ventricle: The cavity size was dilated.  - Pulmonary arteries:PA peak pressure: 46 mm Hg (S).   Antibiotics: NA   DVT prophylaxis: Lovenox  Objective: Blood pressure 133/72, pulse 90, temperature 98.2 F (36.8 C), temperature source Oral, resp. rate 16, height  (1.676 m), weight 63.3 kg (139 lb 8.8 oz), last menstrual period 12/29/2013, SpO2 95 %.  Intake/Output Summary (Last 24 hours) at 10/19/14 1726 Last data filed at 10/19/14 1508  Gross per 24 hour  Intake   1538 ml  Output   2000 ml  Net   -462 ml   Exam: General:  No acute respiratory distress- at times confused- inappropriate responses to questions Lungs: Clear to auscultation bilaterally without wheezes or crackles Cardiovascular: Regular rate and rhythm without murmur gallop or rub Abdomen: Nontender, nondistended, soft, bowel sounds positive, no rebound, no ascites, no appreciable mass Extremities: No significant cyanosis, clubbing, or edema bilateral lower extremities today   Data Reviewed: Basic Metabolic Panel:  Recent Labs Lab 10/15/14 0350 10/15/14 0810 10/17/14 0329 10/18/14 0555 10/18/14 0815 10/19/14 0750  NA 120* 124* 132* 132*  --  133*  K 3.6 3.2* 3.9 4.3  --  4.2  CL 80* 82* 90*  91*  --  88*  CO2 33* 37* 38* 30  --  36*  GLUCOSE 85 114* 110* 94  --  89  BUN <5* <5* 7 19  --  6  CREATININE 0.40* 0.47* 0.51 0.73  --  0.52  CALCIUM 8.5 8.1* 8.4 8.5  --  8.9  MG  --   --  2.1 0.1* 2.0  --    Liver Function  Tests:  Recent Labs Lab 10/14/14 1519 10/17/14 0329  AST 47* 22  ALT 67* 44*  ALKPHOS 113 87  BILITOT 0.8 0.6  PROT 6.5 6.0  ALBUMIN 3.7 3.2*    Recent Labs Lab 10/14/14 1519  LIPASE 39   CBC:  Recent Labs Lab 10/14/14 1453 10/15/14 0350  WBC 10.1 10.6*  NEUTROABS  --  8.2*  HGB 14.6 14.7  HCT 40.8 41.5  MCV 83.4 84.0  PLT 360 397   Cardiac Enzymes:  Recent Labs Lab 10/14/14 1519 10/14/14 2102 10/15/14 0240 10/15/14 0810  TROPONINI <0.03 <0.03 <0.03 <0.03    Recent Results (from the past 240 hour(s))  MRSA PCR Screening     Status: None   Collection Time: 10/14/14  8:00 PM  Result Value Ref Range Status   MRSA by PCR NEGATIVE NEGATIVE Final    Comment:        The GeneXpert MRSA Assay (FDA approved for NASAL specimens only), is one component of a comprehensive MRSA colonization surveillance program. It is not intended to diagnose MRSA infection nor to guide or monitor treatment for MRSA infections.      Studies:  Recent x-ray studies have been reviewed in detail by the Attending Physician  Scheduled Meds:  Scheduled Meds: . ARIPiprazole  400 mg Intramuscular Q28 days  . benztropine  1 mg Oral Daily  . isosorbide mononitrate  10 mg Oral BID WC  . metoprolol tartrate  50 mg Oral BID  . risperiDONE  2 mg Oral BID  . rivaroxaban   Does not apply Once  . Rivaroxaban  15 mg Oral BID WC  . [START ON 11/09/2014] rivaroxaban  20 mg Oral Q supper  . sodium chloride  3 mL Intravenous Q12H    Time spent on care of this patient: 35 mins  Nancy CantorSAima Tallan Sandoz, Nancy Erickson [facility] For Consults/Admissions - Flow Manager [phone]- [phone] Office  [phone]  Contact Nancy Erickson directly via text page:      [email or web address]      password [facility]  10/19/2014, 5:26 PM   LOS: 5 days

## 2014-10-20 DIAGNOSIS — R631 Polydipsia: Secondary | ICD-10-CM

## 2014-10-20 DIAGNOSIS — F54 Psychological and behavioral factors associated with disorders or diseases classified elsewhere: Secondary | ICD-10-CM

## 2014-10-20 LAB — BASIC METABOLIC PANEL
Anion gap: 8 (ref 5–15)
BUN: 7 mg/dL (ref 6–23)
CO2: 29 mmol/L (ref 19–32)
CREATININE: 0.56 mg/dL (ref 0.50–1.10)
Calcium: 8.9 mg/dL (ref 8.4–10.5)
Chloride: 96 mmol/L (ref 96–112)
GFR calc Af Amer: >90 mL/min (ref >90)
GFR calc non Af Amer: >90 mL/min (ref >90)
Glucose, Bld: 101 mg/dL — ABNORMAL HIGH (ref 70–99)
POTASSIUM: 4.5 mmol/L (ref 3.5–5.1)
Sodium: 133 mmol/L — ABNORMAL LOW (ref 135–145)

## 2014-10-20 MED ORDER — ISOSORBIDE MONONITRATE 10 MG PO TABS: 10.0000 mg | ORAL_TABLET | Freq: Two times a day (BID) | ORAL | Status: DC

## 2014-10-20 MED ORDER — LORAZEPAM 1 MG PO TABS: 1.0000 mg | ORAL_TABLET | ORAL | Status: DC | PRN

## 2014-10-20 MED ORDER — ARIPIPRAZOLE ER 400 MG IM SUSR: 400.0000 mg | INTRAMUSCULAR | Status: DC

## 2014-10-20 MED ORDER — RIVAROXABAN 15 MG PO TABS: 15.0000 mg | ORAL_TABLET | Freq: Two times a day (BID) | ORAL | Status: DC

## 2014-10-20 MED ORDER — BENZTROPINE MESYLATE 1 MG PO TABS: 0.5000 mg | ORAL_TABLET | Freq: Every day | ORAL | Status: DC

## 2014-10-20 MED ORDER — RIVAROXABAN 20 MG PO TABS: 20.0000 mg | ORAL_TABLET | Freq: Every day | ORAL | Status: AC

## 2014-10-20 MED ORDER — RISPERIDONE 2 MG PO TABS: 2.0000 mg | ORAL_TABLET | Freq: Two times a day (BID) | ORAL | Status: DC

## 2014-10-20 NOTE — Clinical Social Work Psych Note (Signed)
Psych CSW has reviewed this patient's disposition with psychiatrist and unit CSW.  The psychiatric recommendation at this time continues to be inpatient psychiatric hospitalization. Psych CSW and unit CSW will also search for possible group home placement as patient is at baseline and unable to live independently.  Psych CSW sought counsel from Asst CSW Director who referred psych CSW to [facility] [phone]) and [facility] [phone]).  Both facilities are at capacity at this time.  Psych CSW and unit CSW will continue to seek placement for patient.    Vickii PennaGina Martinez Boxx, LCSWA [phone]  Psychiatric & Orthopedics (5N 1-16) Clinical Social Worker

## 2014-10-20 NOTE — Clinical Social Work Placement (Signed)
Clinical Social Work Department CLINICAL SOCIAL WORK PLACEMENT NOTE 10/20/2014  Patient:  Nancy Erickson,Nancy Erickson  Account Number:  [id number] Admit date:  10/14/2014  Clinical Social Worker:  Lavell LusterJOSEPH BRYANT Shayma Pfefferle, LCSWA  Date/time:  10/20/2014 05:26 PM  Clinical Social Work is seeking post-discharge placement for this patient at the following level of care:   ASSISTED LIVING/REST HOME   (*CSW will update this form in Epic as items are completed)   10/20/2014  Patient/family provided with [facility] of Clinical Social Work's list of facilities offering this level of care within the geographic area requested by the patient (or if unable, by the patient's family).  10/20/2014  Patient/family informed of their freedom to choose among providers that offer the needed level of care, that participate in Medicare, Medicaid or managed care program needed by the patient, have an available bed and are willing to accept the patient.  10/20/2014  Patient/family informed of [facility]' ownership interest in [facility], as well as of the fact that they are under no obligation to receive care at this facility.  PASARR submitted to EDS on 10/19/2014 PASARR number received on 10/19/2014  FL2 transmitted to all facilities in geographic area requested by pt/family on  10/20/2014 FL2 transmitted to all facilities within larger geographic area on   Patient informed that his/her managed care company has contracts with or will negotiate with  certain facilities, including the following:     Patient/family informed of bed offers received:  10/20/2014 Patient chooses bed at Other Physician recommends and patient chooses bed at    Patient to be transferred to Other on   Patient to be transferred to facility by  Patient and family notified of transfer on  Name of family member notified:    The following physician request were entered in Epic:   Additional Comments:    Roddie McBryant  Shelley Cocke MSW, [place]CSWA, [place], [phone]

## 2014-10-20 NOTE — Discharge Summary (Signed)
Physician Discharge Summary  Gaetana Kawahara GEX:[id number] DOB: Dec 01, 1955 DOA: 10/14/2014  PCP: Irving Copas, MD  Admit date: 10/14/2014 Discharge date: 10/20/2014  Time spent: 45 minutes  Recommendations for Outpatient Follow-up:  1. Need to continue long acting Abilify- next dose April 7 and then every 28 days 2. Fluid restrict to 1 L a day 3. Bmet in 1 wk to follow sodium 4. Hypercoag w/u once off of Xarelto  Discharge Condition: stable Diet recommendation: heart healthy fluid restrict to 1 L a day  Discharge Diagnoses:  Principal Problem:   Pulmonary embolus Active Problems:   Schizo-affective schizophrenia   Hyponatremia   Hypertension, uncontrolled   Paranoid schizophrenia   Cardiomyopathy   Pulmonary hypertension   Psychogenic polydipsia Multinodular Goiter- noted on CT- see report  History of present illness:  Nancy Erickson is a 59 y.o. female with history of schizophrenia, hypertension was referred from group home after patient was having shortness of breath. In the ER patient was found to be mildly wheezing and was placed on nebulizers initially. Patient's blood work shows severe hyponatremia with sodium of 110. On exam patient is providing minimal history and as per patient's father patient's mental status is at baseline. Patient also was recently noticed increasing redness and edema around the lower extremity. Chest x-ray shows mild congestion. Patient has per the father has not been started on any new medications. Patient did not have any nausea vomiting abdominal pain diarrhea chest pain. As per patient's father patient drinks a lot of diet soda, ice tea and milk daily. She drinks almost a gallon of milk and a dozen of can soda. Patient also smokes cigarettes as per patient's father. Does not drink alcohol.   Pulmonary embolism - suspected subsegmental PE in LLL transitioned from Lovenox 70 mg BID to Xarelto which is covered under her  plan -Echocardiogram; see results below -Lower extremity Dopplers w/o evidence of DVT  -w/ no clear inciting event will need to complete hypercoag w/u once off anticoag (6 months)  Severe Hyponatremia- fluid overloaded- (see CT chest report)- pulm HTN and right heart failure -improved nicely w/ fluid restriction of 800 cc/hr- will continue to restrict to 1000 cc/day - 110 on admission- 133 today -psychogenic polydipsia - Patient's father states that patient drinks a gallon of milk and at least a dozen of canned soda daily along with ice tea.   Paranoid schizophrenia  -meds per Psych consult - Risperdal BID started- Abilify stopped- Cogentin increased from 0.5 to  daily- given Aripiprazole suspension on 3/11- due every 28 days- added PRN Ativan as well - needs to be monitored 24 hrs-will go to rest home where she will be given her meds and fluid restriction can be done  Hypokalemia -corrected  Hypertension uncontrolled -cont Norvasc and Metoprolol- PCP cont to follow in office  Lower extremity edema with venous congestion stasis dermatitis -Dopplers of the lower extremities w/o DVT - suspect this is volume related in polydipsia + some mild R heart strain / RV failure due to PE     Procedures: ECHO 3/12 3/12 echocardiogram Left ventricle: The cavity size was normal. Systolic function was normal. The estimated ejection fraction was in the range of 60% to 65%. Wall motion was normal; there were no regional wall motion abnormalities. - Left atrium: The atrium was mildly to moderately dilated. - Right ventricle: The cavity size was dilated. Wall thickness was normal. Systolic function was mildly reduced. - Right atrium: The atrium was mildly to moderately dilated. - Pulmonary  arteries: Systolic pressure was moderately increased. PA peak pressure: 46 mm Hg (S).  Vascular Ultrasound 3/14 Lower extremity venous duplex has been completed. Preliminary findings: Negative  for acute DVT. Chronic strand noted in the right distal femoral vein. Enlarged inguinal lymph nodes are seen bilaterally. Varicose veins are noted in the right calf.   Consultations:  psychiatry  Discharge Exam: Filed Weights   10/18/14 0520 10/19/14 0231 10/20/14 0548  Weight: 64 kg (141 lb 1.5 oz) 63.3 kg (139 lb 8.8 oz) 63.5 kg (139 lb 15.9 oz)   Filed Vitals:   10/20/14 0644  BP: 157/67  Pulse: 111  Temp: 99.3 F (37.4 C)  Resp: 16    General: AAO x 3, no distress- affect is inappropriate- she is calm and cooperative Cardiovascular: RRR, no murmurs  Respiratory: clear to auscultation bilaterally GI: soft, non-tender, non-distended, bowel sound positive  Discharge Instructions You were cared for by a hospitalist during your hospital stay. If you have any questions about your discharge medications or the care you received while you were in the hospital after you are discharged, you can call the unit and asked to speak with the hospitalist on call if the hospitalist that took care of you is not available. Once you are discharged, your primary care physician will handle any further medical issues. Please note that NO REFILLS for any discharge medications will be authorized once you are discharged, as it is imperative that you return to your primary care physician (or establish a relationship with a primary care physician if you do not have one) for your aftercare needs so that they can reassess your need for medications and monitor your lab values.      Discharge Instructions    Diet - low sodium heart healthy    Complete by:  As directed      Increase activity slowly    Complete by:  As directed             Medication List    TAKE these medications        amLODipine 10 MG tablet  Commonly known as:  NORVASC  Take 10 mg by mouth daily.     ARIPiprazole 400 MG Susr  Commonly known as:  ABILIFY MAINTENA  Inject 400 mg into the muscle every 28 (twenty-eight) days.   Start taking on:  11/10/2014     benztropine 1 MG tablet  Commonly known as:  COGENTIN  Take 0.5 tablets (0.5 mg total) by mouth daily.     isosorbide mononitrate 10 MG tablet  Commonly known as:  ISMO,MONOKET  Take 1 tablet (10 mg total) by mouth 2 (two) times daily with a meal.     LORazepam 1 MG tablet  Commonly known as:  ATIVAN  Take 1 tablet (1 mg total) by mouth every 4 (four) hours as needed for anxiety or sleep.     metoprolol tartrate 25 MG tablet  Commonly known as:  LOPRESSOR  Take 50 mg by mouth daily.     risperiDONE 2 MG tablet  Commonly known as:  RISPERDAL  Take 1 tablet (2 mg total) by mouth 2 (two) times daily.     Rivaroxaban 15 MG Tabs tablet  Commonly known as:  XARELTO  Take 1 tablet (15 mg total) by mouth 2 (two) times daily with a meal.     rivaroxaban 20 MG Tabs tablet  Commonly known as:  XARELTO  Take 1 tablet (20 mg total) by mouth daily with supper.  Start taking on:  11/08/2014     triamcinolone cream 0.1 %  Commonly known as:  KENALOG  Apply 1 application topically 2 (two) times daily.       Allergies  Allergen Reactions  . Phosphate Nausea And Vomiting  . Artane [Trihexyphenidyl] Other (See Comments)    unknown  . Codeine Other (See Comments)    unknown      The results of significant diagnostics from this hospitalization (including imaging, microbiology, ancillary and laboratory) are listed below for reference.    Significant Diagnostic Studies: Dg Chest 2 View  10/14/2014   CLINICAL DATA:  Shortness of breath, change in color, was given a breathing treatment, history asthma, smoking  EXAM: CHEST  2 VIEW  COMPARISON:  11/24/2013  FINDINGS: Enlargement of cardiac silhouette with pulmonary vascular congestion.  Mediastinal contours normal.  No acute infiltrate, pleural effusion or pneumothorax.  No acute osseous findings.  Anterior density seen on lateral view at the inferior chest is likely related to callus at old LEFT rib fractures.   IMPRESSION: Enlargement of cardiac silhouette with pulmonary vascular congestion.  No acute abnormalities.   Electronically Signed   By: Ulyses Southward M.D.   On: 10/14/2014 13:25   Ct Head Wo Contrast  10/14/2014   CLINICAL DATA:  Dizziness, confusion, unable to answer questions appropriately, history smoking, paranoid schizophrenia  EXAM: CT HEAD WITHOUT CONTRAST  TECHNIQUE: Contiguous axial images were obtained from the base of the skull through the vertex without intravenous contrast.  COMPARISON:  11/07/2013  FINDINGS: Scattered motion artifacts.  Normal ventricular morphology.  No midline shift or mass effect.  Normal appearance of brain parenchyma.  No intracranial hemorrhage, mass lesion, or acute infarction.  Visualized paranasal sinuses and mastoid air cells clear.  Bones unremarkable.  IMPRESSION: No definite acute intracranial abnormalities.   Electronically Signed   By: Ulyses Southward M.D.   On: 10/14/2014 18:06   Ct Angio Chest Pe W/cm &/or Wo Cm  10/15/2014   CLINICAL DATA:  Wheezing and shortness of breath.  EXAM: CT ANGIOGRAPHY CHEST WITH CONTRAST  TECHNIQUE: Multidetector CT imaging of the chest was performed using the standard protocol during bolus administration of intravenous contrast. Multiplanar CT image reconstructions and MIPs were obtained to evaluate the vascular anatomy.  CONTRAST:  75mL OMNIPAQUE IOHEXOL 350 MG/ML SOLN  COMPARISON:  Chest radiograph 1 day prior.  FINDINGS: There are probable subsegmental filling defects in the left lower lobe pulmonary arteries consistent with pulmonary emboli. No right-sided pulmonary arterial filling defects are seen. Detailed evaluation is limited by patient breathing motion. There is enlargement of the main pulmonary artery measuring 3.4 cm.  There is multi chamber cardiomegaly. The thoracic aorta is normal in caliber. There is no pleural or pericardial effusion. No mediastinal or hilar adenopathy.  The esophagus is dilated contains intraluminal  fluid. There is a moderate hiatal hernia. Moderate whole body wall edema.  Detailed lung parenchymal evaluation is limited due to motion. There is no large consolidation.  Evaluation of the upper abdomen demonstrates no acute abnormality. Enlarged heterogeneous right lobe of the thyroid gland. There are multiple thyroid nodules within both lobes. Largest nodule measures 11 mm.  There are old left-sided rib fractures. No acute osseous abnormalities seen.  Review of the MIP images confirms the above findings.  IMPRESSION: 1. Suspect subsegmental pulmonary emboli to the left lower lobe. Breathing motion artifact obscures detailed evaluation. 2. Multi chamber cardiomegaly. There is enlargement of the main pulmonary artery measuring up to 3.4  cm. 3. Whole body wall edema, aspect third-spacing. 4. Moderate hiatal hernia with dilated esophagus, small fluid level. This can be seen in the setting of reflux. 5. Multinodular goiter, largest thyroid nodule measures 11 mm. Critical Value/emergent results were called by telephone at the time of interpretation on 10/15/2014 at 4:32 am to NP Burnadette Peter , who verbally acknowledged these results.   Electronically Signed   By: Rubye Oaks M.D.   On: 10/15/2014 04:33    Microbiology: Recent Results (from the past 240 hour(s))  MRSA PCR Screening     Status: None   Collection Time: 10/14/14  8:00 PM  Result Value Ref Range Status   MRSA by PCR NEGATIVE NEGATIVE Final    Comment:        The GeneXpert MRSA Assay (FDA approved for NASAL specimens only), is one component of a comprehensive MRSA colonization surveillance program. It is not intended to diagnose MRSA infection nor to guide or monitor treatment for MRSA infections.      Labs: Basic Metabolic Panel:  Recent Labs Lab 10/15/14 0810 10/17/14 0329 10/18/14 0555 10/18/14 0815 10/19/14 0750 10/20/14 0720  NA 124* 132* 132*  --  133* 133*  K 3.2* 3.9 4.3  --  4.2 4.5  CL 82* 90* 91*  --  88* 96  CO2  37* 38* 30  --  36* 29  GLUCOSE 114* 110* 94  --  89 101*  BUN <5* 7 19  --  6 7  CREATININE 0.47* 0.51 0.73  --  0.52 0.56  CALCIUM 8.1* 8.4 8.5  --  8.9 8.9  MG  --  2.1 0.1* 2.0  --   --    Liver Function Tests:  Recent Labs Lab 10/14/14 1519 10/17/14 0329  AST 47* 22  ALT 67* 44*  ALKPHOS 113 87  BILITOT 0.8 0.6  PROT 6.5 6.0  ALBUMIN 3.7 3.2*    Recent Labs Lab 10/14/14 1519  LIPASE 39   No results for input(s): AMMONIA in the last 168 hours. CBC:  Recent Labs Lab 10/14/14 1453 10/15/14 0350  WBC 10.1 10.6*  NEUTROABS  --  8.2*  HGB 14.6 14.7  HCT 40.8 41.5  MCV 83.4 84.0  PLT 360 397   Cardiac Enzymes:  Recent Labs Lab 10/14/14 1519 10/14/14 2102 10/15/14 0240 10/15/14 0810  TROPONINI <0.03 <0.03 <0.03 <0.03   BNP: BNP (last 3 results)  Recent Labs  10/14/14 2106  BNP 88.0    ProBNP (last 3 results) No results for input(s): PROBNP in the last 8760 hours.  CBG: No results for input(s): GLUCAP in the last 168 hours.     SignedCalvert Cantor, MD [facility] 10/20/2014, 10:13 AM

## 2014-10-20 NOTE — Clinical Social Work Psych Note (Addendum)
12:14pm- Psych CSW spoke with patient's father, Jake SharkHarold who states that patient receives a "Medicaid check" totalling right under $1000 per month.  Psych CSW relayed to patient's father that the after the group home payment, the patient would have approximately $66 left over for personal use.  Father states that he is not wishing to assume guardianship for patient at this time, but would like to continue to be heavily involved in her care and assist with decision making tasks.  Father would like to tour the facility, but is unable to view the facility until after 4:30pm today.  Unit CSW updated.   11:07am- Psych CSW left a message with Jake SharkHarold (patient's father) regarding disposition.  Psych CSW awaiting a return call.  Vickii PennaGina Kemia Wendel, LCSWA [phone]  Psychiatric & Orthopedics (5N 1-16) Clinical Social Worker

## 2014-10-20 NOTE — Clinical Social Work Note (Signed)
CSW has [facility] [place]. Psych CSW has spoken to patient's father regarding the bed and wished to see the facility. The patient's father visited the facility and reported to psych CSW that he was not too impressed with the home. Per psych CSW patient's father understands that a safe disposition has been provided for the patient and the patient will be discharged in the morning to either home with the family (with continued search for group home placement) or to the group home that has accepted the patient ([facility]). Patient's discharged will NOT be delayed. This CSW has confirmed with [facility] that the group home can manage the patient's needs, including the need for fluid restriction. Signed FL2, DC Summary, H&P, and Facesheet [facility] at [phone]. CSW will leave report for covering CSW.   Roddie McBryant Katey Barrie MSW, [place]CSWA, [employer], [phone]

## 2014-10-20 NOTE — Consult Note (Signed)
Psychiatry Consult Follow up  Reason for Consult:  Confusion, psychosis Referring Physician:  Dr. Sherral Hammers Patient Identification: Nancy Erickson MRN:  [id number] Principal Diagnosis: Pulmonary embolus Diagnosis:   Patient Active Problem List   Diagnosis Date Noted  . 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 Cardiomyopathy [I42.9]   . Pulmonary hypertension [I27.0]   . Pulmonary embolus [I26.99]   . Paranoid schizophrenia [F20.0]   . Hypertension, uncontrolled [I10] 10/14/2014  . Schizo-affective schizophrenia [F25.0] 10/12/2013  . Hyponatremia [E87.1] 10/12/2013  . Altered mental state [R41.82] 10/12/2013  . HTN (hypertension) [I10] 10/11/2013  . Hypertensive urgency [I10] 10/11/2013    Total Time spent with patient: 20 minutes  Subjective:   Nancy Erickson is a 59 y.o. female patient admitted with shortness of breath  HPI: This patient is a 59 year old white female with a history of schizophrenia and hypertension. Apparently she was referred from her group home after having shortness of breath. She's a very poor historian and difficult to understand but apparently she lives in a group home and is getting help through an ACT team at [facility]. In reviewing her records she's had very poor medication compliance and at times has overused medications and needed to be admitted for the consequences. Currently she is on injectable Abilify once a month as well as Cogentin. In the past she has been on Risperdal. Currently the patient has been drinking excessively, primarily diet soda ice tea and milk. She was severely hyponatremic on admission and this is improving with fluid restriction. She also has been diagnosed with a pulmonary embolism. Mental status she is extremely disorganized and difficult to understand due to dysarthria. She attempted to write down where she lives in her phone numbers but it was very garbled. Her nurse reports that the patient repeats the same things over and  over again, is responding to internal stimuli and talking to people who aren't there. She is anxious and has not been able to sleep for the last 24 hours. She cannot answer questions coherently.   10/20/14 Interval History: Patient seen sitting and relaxing in a chair at nursing station. Spoke with staff RN and psychiatric social service. Patient has been calm, cooperative and pleasant during this evaluation. Patient denied symptoms of depression, anxiety, auditory/visual hallucinations, delusions or paranoia. Patient has no agitation or aggressive behaviors. She  has been compliant with her medication management without adverse effects. She is a poor historian and  has some difficulty with her speech due to articulation difficulties. Case manager will contact regarding her disposition plan and out patient psychiatric services.Patient does not meet criteria for acute psychiatric hospitalization at this time.   As per LCSW communication: Patient's father expressed concern regarding patient's psychitropic medication. Patient's father reported patient stopped taking Risperidone in February of 2015 and was then placed on PO Abilify. Per patient's father, patient was not taking Abilify but rather throwing it away or selling the medication. Patient's father reports patient was then prescribed Abilify injections every 30 days. Next dosage is for Thursday, October 20, 2014. Patient's father informed covering Psych CSW patient's family does not believe patient's medication is lasting a full 30 days as patient's behaviors decline towards the end of the cycle of medication. Patient's father reports patient's best behavior and baseline is when patient received a double dose of Cogentin and 30 day Abilify injection. Patient's father was unable to recall exact dosage of "double dose of Cogentin." Patient's father expressed concern for patient stating patient's father is "doubtful for a cure  for her."   Past Medical History:   Past Medical History  Diagnosis Date  . Paranoid schizophrenia   . Venous insufficiency of right lower extremity   . Hypertension    History reviewed. No pertinent past surgical history. Family History:  Family History  Problem Relation Age of Onset  . Atrial fibrillation Father    Social History:  History  Alcohol Use No     History  Drug Use No    History   Social History  . Marital Status: Single    Spouse Name: N/A  . Number of Children: N/A  . Years of Education: N/A   Social History Main Topics  . Smoking status: Current Every Day Smoker -- 1.00 packs/day    Types: Cigarettes  . Smokeless tobacco: Not on file  . Alcohol Use: No  . Drug Use: No  . Sexual Activity: Not on file   Other Topics Concern  . None   Social History Narrative   Additional Social History:      Allergies:   Allergies  Allergen Reactions  . Phosphate Nausea And Vomiting  . Artane [Trihexyphenidyl] Other (See Comments)    unknown  . Codeine Other (See Comments)    unknown    Vitals: Blood pressure 157/67, pulse 111, temperature 99.3 F (37.4 C), temperature source Oral, resp. rate 16, height _0  (1.676 m), weight 63.5 kg (139 lb 15.9 oz), last menstrual period 12/29/2013, SpO2 92 %.  Risk to Self: Is patient at risk for suicide?: No Risk to Others:   Prior Inpatient Therapy:   Prior Outpatient Therapy:    Current Facility-Administered Medications  Medication Dose Route Frequency Provider Last Rate Last Dose  . acetaminophen (TYLENOL) tablet 650 mg  650 mg Oral Q6H PRN Rise Patience, MD       Or  . acetaminophen (TYLENOL) suppository 650 mg  650 mg Rectal Q6H PRN Rise Patience, MD      . albuterol (PROVENTIL) (2.5 MG/3ML) 0.083% nebulizer solution 2.5 mg  2.5 mg Nebulization Q2H PRN Rise Patience, MD      . ARIPiprazole SUSR 400 mg  400 mg Intramuscular Q28 days Debbe Odea, MD   400 mg at 10/18/14 1707  . benztropine (COGENTIN) tablet 1 mg  1 mg Oral  Daily Rise Patience, MD   1 mg at 10/20/14 0951  . hydrALAZINE (APRESOLINE) injection 10 mg  10 mg Intravenous Q4H PRN Rise Patience, MD      . LORazepam (ATIVAN) tablet 1 mg  1 mg Oral Q4H PRN Cloria Spring, MD   1 mg at 10/20/14 0809  . metoprolol (LOPRESSOR) tablet 50 mg  50 mg Oral BID Cherene Altes, MD   50 mg at 10/20/14 0951  . ondansetron (ZOFRAN) tablet 4 mg  4 mg Oral Q6H PRN Rise Patience, MD       Or  . ondansetron [facility]) injection 4 mg  4 mg Intravenous Q6H PRN Rise Patience, MD   4 mg at 10/15/14 1936  . risperiDONE (RISPERDAL) tablet 2 mg  2 mg Oral BID Ambrose Finland, MD   2 mg at 10/20/14 0951  . rivaroxaban [employer]) Education Kit for DVT/PE patients   Does not apply Once Debbe Odea, MD      . Rivaroxaban (XARELTO) tablet 15 mg  15 mg Oral BID WC Debbe Odea, MD   15 mg at 10/20/14 0809  . [START ON 11/09/2014] rivaroxaban (XARELTO) tablet 20 mg  20 mg Oral Q supper Debbe Odea, MD      . sodium chloride 0.9 % injection 3 mL  3 mL Intravenous Q12H Rise Patience, MD   3 mL at 10/20/14 2060    Musculoskeletal: Strength & Muscle Tone: decreased Gait & Station: unsteady Patient leans: N/A  Psychiatric Specialty Exam: Physical Exam  Review of Systems  Constitutional: Positive for malaise/fatigue.  Neurological: Positive for dizziness.  Psychiatric/Behavioral: Positive for hallucinations. The patient is nervous/anxious.     Blood pressure 157/67, pulse 111, temperature 99.3 F (37.4 C), temperature source Oral, resp. rate 16, height _0  (1.676 m), weight 63.5 kg (139 lb 15.9 oz), last menstrual period 12/29/2013, SpO2 92 %.Body mass index is 22.61 kg/(m^2).  General Appearance: Casual  Eye Contact::  Good  Speech:  Clear and Coherent and dysathria   Volume:  Normal  Mood:  Euthymic  Affect:  Appropriate and Congruent  Thought Process:  Coherent and Goal Directed  Orientation:  Full (Time, Place, and Person)  Thought  Content:  Rumination  Suicidal Thoughts:  No  Homicidal Thoughts:  No  Memory:  Immediate;   Fair Recent;   Fair Remote;   Fair  Judgement:  Fair  Insight:  Fair  Psychomotor Activity:  Normal  Concentration:  Fair  Recall:  [employer] [place]  Language: Fair  Akathisia:  Negative  Handed:  Right  AIMS (if indicated):     Assets:  Communication Skills Desire for Improvement Leisure Time Resilience Social Support  ADL's:  Impaired  Cognition: Impaired,  Mild  Sleep:      Medical Decision Making: Established Problem, Stable/Improving (1), Review of Psycho-Social Stressors (1), Review or order clinical lab tests (1), Review of Medication Regimen & Side Effects (2) and Review of New Medication or Change in Dosage (2)  Treatment Plan Summary: Daily contact with patient to assess and evaluate symptoms and progress in treatment and Medication management  Plan: Continue Ativan 1 mg PO Q4H PRN for anxiety and insomnia Continuee Risperidone 2 mg PO BID  Continue Cogentin 1 mg PO QD for EPS Patient will be referred to the outpatient psychiatric medication management when medically stable. Patient will be referred to the group home placement and does not meet criteria for inpatient psychiatric hospitalization   Disposition: Patient will be placed in a group home placement when medically stable. Patient will be referred to the outpatient psychiatric services upon discharge.   Avid Guillette,JANARDHAHA R. 10/20/2014 11:02 AM

## 2014-10-20 NOTE — Progress Notes (Signed)
Medicare Important Message given?  YES (If response is "NO", the following Medicare IM given date fields will be blank) Date Medicare IM given:  10/20/14 Medicare IM given by:  British Moyd 

## 2014-10-21 MED ORDER — TUBERCULIN PPD 5 UNIT/0.1ML ID SOLN
5.0000 [IU] | Freq: Once | INTRADERMAL | Status: DC
  Filled 2014-10-21: qty 0.1

## 2014-10-21 NOTE — Clinical Social Work Note (Signed)
Psych CSW received report from covering unit CSW that patient's father is requesting for patient to be discharged to  her apartment and not group home.  Psych CSW reviewed the urgency with father for patient to be discharged to safe environment which provides 24/7 care.  Father was aware and agreed 10/20/2014.  Per report, father has since changed his mind.  Psych CSW reviewed with father 10/20/2014 that if patient were to discharge home that an Adult Protective Services report would need to be made to ensure that patient is safe in her home environment.  Psych CSW, unit CSW, psychiatrist and [facility] Manager are all in agreement that patient needs 24 hour supervision and a much higher of care than independent level of living.  Per report, of father requesting patient to be discharged back to independent living, psych CSW made an Adult Protective Services report.  Recommended Disposition: Group Home - [facility] where patient has been accepted.  Patient's father currently refusing placement.  Nancy Erickson, LCSWA [phone]  Psychiatric & Orthopedics (5N 1-16) Clinical Social Worker

## 2014-10-21 NOTE — Clinical Social Work Psych Note (Signed)
Psych CSW received a call from patient's father (10/20/2014-Thursday) regarding his tour of the group home.  Jake SharkHarold expressed concerns regarding patient having shared room at group home and having "nothing to do" at the home.  Father was made aware of the efforts of both this psych CSW and the unit CSW in regards to finding a bed at a group home for the patient to have a safe discharge.  Father is aware and agreeable to group home discharge.  Father is agreeable that patient cannot return home due to cognitively not being able to care for herself.  Jake SharkHarold is aware that he may continue to seek placement for patient after discharge.  Father is aware (through both [facility] CSW efforts and [facility] efforts) that there is a wait [place] for group homes [place].  Father is aware that he may place patient on the waitlist at a preferred group home while she resides at [facility].  Jake SharkHarold states to this CSW that he providing 24/7 care to patient is not an option.  Jake SharkHarold is aware and agreeable to a discharge today to [facility].  Unit CSW to follow-up and make father aware of specific discharge details.  Psych CSW remains available for assistance.  Vickii PennaGina Nyzaiah Kai, LCSWA [phone]  Psychiatric & Orthopedics (5N 1-16) Clinical Social Worker

## 2014-10-21 NOTE — Progress Notes (Signed)
Patient discharged yesterday. She did not leave due to social issues. I have evaluated her. She is still stable to be discharged today.   Calvert CantorSaima Raney Antwine, MD

## 2014-10-21 NOTE — Progress Notes (Signed)
Pt discharged and taken out with Child psychotherapistsocial worker.

## 2014-10-21 NOTE — Clinical Social Work Note (Signed)
Clinical Social Worker spoke with patient father at bedside for continued support and disposition planning needs.  Patient father was made aware of bed availability at [facility] yesterday, however today he is refusing for patient to discharge to this facility.  [facility] or [facility] (deemed unsafe and APS to be notified). Patient father states that he will take [facility] and work on placement from there.  CSW has notified psych CSW who plans to make APS report.  CSW to return to talk to patient father and sister when patient sister arrives.  CSW remains available for support and to facilitate patient discharge needs.  Macario GoldsJesse Jorita Bohanon, [place] [phone]

## 2014-10-21 NOTE — Clinical Social Work Note (Signed)
Clinical Social Worker facilitated patient discharge including contacting patient family and facility to confirm patient discharge plans.  Clinical information faxed to facility and family agreeable with plan.  CSW arranged transport with patient father to [facility].    Clinical Social Worker will sign off for now as social work intervention is no longer needed. Please consult us again if new need arises.  Macario GoldsJesse Amairani Shuey, [place] [phone]

## 2014-10-21 NOTE — Clinical Social Work Note (Signed)
Clinical Social Worker spoke with patient father and sister at bedside to further discharge planning.  Patient sister explains that patient apartment is not livable and will need placement.  Patient sister and father requested contact be made with [facility] in [place] - CSW contacted and left 3 voicemails.  Patient sister was able to reach facility who states that patient will need to complete application and admissions coordinator will not return until Monday.  Patient sister has provided guidance to patient father who is now accepting of placement [facility] as a temporary placement until new placement can be established.  CSW spoke with [facility] who agreed that patient is appropriate for admission today and medications have been delivered to the home.  TB skin test placed by request and will be read at the facility next week.  CSW to facilitate patient discharge plans.  Macario GoldsJesse Maliah Pyles, [place] [phone]

## 2014-10-24 NOTE — Clinical Social Work Note (Signed)
CSW received from APS social worker Randa EvensJoanne [phone]) requesting clinical information on patient. CSW faxed information to [phone]. CSW signing off at this time.   Roddie McBryant Rosie Golson MSW, [place], [place], [phone]

## 2014-10-26 ENCOUNTER — Encounter (HOSPITAL_COMMUNITY): Payer: Self-pay

## 2014-10-26 ENCOUNTER — Emergency Department (HOSPITAL_COMMUNITY)
Admission: EM | Admit: 2014-10-26 | Discharge: 2014-10-27 | Disposition: A | Discharge status: Home / Self Care | Payer: Medicare Other | Attending: Emergency Medicine | Admitting: Emergency Medicine

## 2014-10-26 DIAGNOSIS — Z8639 Personal history of other endocrine, nutritional and metabolic disease: Secondary | ICD-10-CM | POA: Diagnosis not present

## 2014-10-26 DIAGNOSIS — Z79899 Other long term (current) drug therapy: Secondary | ICD-10-CM | POA: Insufficient documentation

## 2014-10-26 DIAGNOSIS — Z720 Tobacco use: Secondary | ICD-10-CM | POA: Insufficient documentation

## 2014-10-26 DIAGNOSIS — G4700 Insomnia, unspecified: Secondary | ICD-10-CM | POA: Diagnosis not present

## 2014-10-26 DIAGNOSIS — I2699 Other pulmonary embolism without acute cor pulmonale: Secondary | ICD-10-CM | POA: Diagnosis not present

## 2014-10-26 DIAGNOSIS — Z7901 Long term (current) use of anticoagulants: Secondary | ICD-10-CM | POA: Insufficient documentation

## 2014-10-26 DIAGNOSIS — Z008 Encounter for other general examination: Secondary | ICD-10-CM | POA: Diagnosis present

## 2014-10-26 DIAGNOSIS — F200 Paranoid schizophrenia: Secondary | ICD-10-CM | POA: Diagnosis not present

## 2014-10-26 DIAGNOSIS — Z86711 Personal history of pulmonary embolism: Secondary | ICD-10-CM | POA: Insufficient documentation

## 2014-10-26 DIAGNOSIS — I10 Essential (primary) hypertension: Secondary | ICD-10-CM | POA: Insufficient documentation

## 2014-10-26 DIAGNOSIS — F209 Schizophrenia, unspecified: Secondary | ICD-10-CM | POA: Diagnosis present

## 2014-10-26 DIAGNOSIS — Z7952 Long term (current) use of systemic steroids: Secondary | ICD-10-CM | POA: Diagnosis not present

## 2014-10-26 DIAGNOSIS — E871 Hypo-osmolality and hyponatremia: Secondary | ICD-10-CM | POA: Diagnosis not present

## 2014-10-26 HISTORY — DX: Hypo-osmolality and hyponatremia: E87.1

## 2014-10-26 HISTORY — DX: Other pulmonary embolism without acute cor pulmonale: I26.99

## 2014-10-26 LAB — CBC WITH DIFFERENTIAL/PLATELET
BASOS ABS: 0 10*3/uL (ref 0.0–0.1)
Basophils Relative: 0 % (ref 0–1)
EOS PCT: 2 % (ref 0–5)
Eosinophils Absolute: 0.1 10*3/uL (ref 0.0–0.7)
HCT: 40.4 % (ref 36.0–46.0)
Hemoglobin: 13.4 g/dL (ref 12.0–15.0)
LYMPHS PCT: 21 % (ref 12–46)
Lymphs Abs: 1.7 10*3/uL (ref 0.7–4.0)
MCH: 30 pg (ref 26.0–34.0)
MCHC: 33.2 g/dL (ref 30.0–36.0)
MCV: 90.4 fL (ref 78.0–100.0)
Monocytes Absolute: 0.8 10*3/uL (ref 0.1–1.0)
Monocytes Relative: 10 % (ref 3–12)
NEUTROS ABS: 5.6 10*3/uL (ref 1.7–7.7)
Neutrophils Relative %: 67 % (ref 43–77)
PLATELETS: 405 10*3/uL — AB (ref 150–400)
RBC: 4.47 MIL/uL (ref 3.87–5.11)
RDW: 13.8 % (ref 11.5–15.5)
WBC: 8.3 10*3/uL (ref 4.0–10.5)

## 2014-10-26 LAB — RAPID URINE DRUG SCREEN, HOSP PERFORMED
AMPHETAMINES: NOT DETECTED
Barbiturates: NOT DETECTED
Benzodiazepines: NOT DETECTED
Cocaine: NOT DETECTED
Opiates: NOT DETECTED
TETRAHYDROCANNABINOL: NOT DETECTED

## 2014-10-26 LAB — BASIC METABOLIC PANEL
ANION GAP: 8 (ref 5–15)
BUN: 15 mg/dL (ref 6–23)
CO2: 27 mmol/L (ref 19–32)
CREATININE: 0.85 mg/dL (ref 0.50–1.10)
Calcium: 8.5 mg/dL (ref 8.4–10.5)
Chloride: 94 mmol/L — ABNORMAL LOW (ref 96–112)
GFR, EST AFRICAN AMERICAN: 87 mL/min — AB (ref >90)
GFR, EST NON AFRICAN AMERICAN: 75 mL/min — AB (ref >90)
Glucose, Bld: 139 mg/dL — ABNORMAL HIGH (ref 70–99)
Potassium: 3.7 mmol/L (ref 3.5–5.1)
SODIUM: 129 mmol/L — AB (ref 135–145)

## 2014-10-26 MED ORDER — LORAZEPAM 1 MG PO TABS
1.0000 mg | ORAL_TABLET | ORAL | Status: DC | PRN
  Administered 2014-10-26: 1 mg via ORAL
  Filled 2014-10-26: qty 1

## 2014-10-26 MED ORDER — BENZTROPINE MESYLATE 1 MG PO TABS
0.5000 mg | ORAL_TABLET | Freq: Every day | ORAL | Status: DC
  Administered 2014-10-27: 0.5 mg via ORAL
  Filled 2014-10-26: qty 1

## 2014-10-26 MED ORDER — RIVAROXABAN 15 MG PO TABS
15.0000 mg | ORAL_TABLET | Freq: Two times a day (BID) | ORAL | Status: DC
  Administered 2014-10-26 – 2014-10-27 (×3): 15 mg via ORAL
  Filled 2014-10-26 (×4): qty 1

## 2014-10-26 MED ORDER — ONDANSETRON HCL 4 MG PO TABS: 4.0000 mg | ORAL_TABLET | Freq: Three times a day (TID) | ORAL | Status: DC | PRN

## 2014-10-26 MED ORDER — ACETAMINOPHEN 325 MG PO TABS: 650.0000 mg | ORAL_TABLET | ORAL | Status: DC | PRN

## 2014-10-26 MED ORDER — AMLODIPINE BESYLATE 10 MG PO TABS
10.0000 mg | ORAL_TABLET | Freq: Every day | ORAL | Status: DC
  Administered 2014-10-27: 10 mg via ORAL
  Filled 2014-10-26 (×2): qty 1

## 2014-10-26 MED ORDER — ZOLPIDEM TARTRATE 5 MG PO TABS
5.0000 mg | ORAL_TABLET | Freq: Every evening | ORAL | Status: DC | PRN
Start: 2014-10-26 — End: 2014-10-27

## 2014-10-26 MED ORDER — METOPROLOL TARTRATE 25 MG PO TABS
50.0000 mg | ORAL_TABLET | Freq: Two times a day (BID) | ORAL | Status: DC
  Administered 2014-10-26 – 2014-10-27 (×2): 50 mg via ORAL
  Filled 2014-10-26 (×2): qty 2

## 2014-10-26 MED ORDER — RISPERIDONE 2 MG PO TABS
2.0000 mg | ORAL_TABLET | Freq: Two times a day (BID) | ORAL | Status: DC
  Administered 2014-10-26 – 2014-10-27 (×2): 2 mg via ORAL
  Filled 2014-10-26 (×2): qty 1

## 2014-10-26 MED ORDER — SODIUM CHLORIDE 0.9 % IV BOLUS (SEPSIS)
500.0000 mL | Freq: Once | INTRAVENOUS | Status: AC
  Administered 2014-10-26: 500 mL via INTRAVENOUS

## 2014-10-26 MED ORDER — SODIUM CHLORIDE 0.9 % IV SOLN
INTRAVENOUS | Status: DC
  Administered 2014-10-26: 16:00:00 via INTRAVENOUS

## 2014-10-26 MED ORDER — ISOSORBIDE MONONITRATE 10 MG PO TABS
10.0000 mg | ORAL_TABLET | Freq: Every day | ORAL | Status: DC
  Administered 2014-10-27: 10 mg via ORAL
  Filled 2014-10-26 (×2): qty 1

## 2014-10-26 NOTE — ED Notes (Signed)
Pt O2 sat 80% on RA, placed on 2L [place], O2 sat up to 92-93%

## 2014-10-26 NOTE — BH Assessment (Signed)
BHH Assessment Progress Note   Received order for psychiatry consult for the pt from [facility] and he stated a TTS consult not needed, but Social Work and psychiatry consult needed.  Informed Dahlia ByesJosephine Onuoha, NP, who stated pt would be seen by psychiatry in AM.  Social Work is aware and involved in the pt's case.  Casimer LaniusKristen Belma Dyches, MS, [facility] Therapeutic Triage Specialist [facility]

## 2014-10-26 NOTE — Progress Notes (Signed)
CSW met with pt at bedside. However, pt is extremely hard to understand.  Father and sister were present at bedside. Dad gave CSW a typed memo which stated that the pt has a speech problem and it is usually very difficult to understand what she is saying. Family appears to be a good support. They are also a good resource for collateral. Dad states that he lives in [place], and the sister informed CSW that she lives in [place].  Dad informed CSW that the pt was at [facility] week due to SOB. Dad states that the pt was referred to [facility]. The Care home is located in [place], [place]. He states that he is concerned for the pt's safety. Dad informed CSW that the care home where the pt is residing has a pond/lake in the back yard and that there is no gate in the yard.   Family states that the pt does not sleep at night. 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 Family expressed that the pt's medications may need to be evaluated.  Sister states that the pt has not had any falls within the past 6 months. However, she informed CSW that the pt does need assistance with her ADL's.    CSW spoke with Rise Paganini who is the owner of the group home where the pt resides. 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   Rise Paganini informed CSW that she has another group home location. She states that this group home will have 6 residents in the home, including the pt if she decides to come. Also, she states that this particular location does not have a lake in the back and that there is a fence around the property  . Owner states that the fence does have a latch on it. Therefore, the pt could possibly open the fence. Owner states that this group home is not a locked facility. However, she states that the pt will be welcomed at that group home location.  Dad/Harold [phone] Sister/Laura [phone]                      [phone]  [facility] Owner)  [place], [place] ED CSW 10/26/2014 11:22 PM

## 2014-10-26 NOTE — ED Notes (Signed)
Patient's sister states that the [facility] and was discharged 5 days ago and was placed in a "care home". [facility] and the place where she lives has ponds in the front and back. Patient's sister states that the "care home" staff sleep at night and felt that it was unsafe for her to be there. Patient's sister states the patient needs an assisted living facility. Also patient's sister reports that the patient has hyponatremia.

## 2014-10-26 NOTE — ED Notes (Signed)
Pt kept on coming to the nurse's station.  Was easily re-directed to go back to bed but will get out of bed after a few minutes, and then splashes water to her face after getting out of bed;  pt stated, "to freshen up, I just woke up".  Was made aware of her repetitive behavior--- pt says "sorry", but she displays behavior again.

## 2014-10-26 NOTE — BH Assessment (Deleted)
B[facility] Assessment Progress Note   Called and scheduled pt's tele assessment with this clinician as well as gathered clinical information on the pt from EDP Zackowski.  Kristen Gregrey Bloyd, MS, LPC Therapeutic Triage Specialist [facility]      

## 2014-10-26 NOTE — Progress Notes (Signed)
[facility]  Address: [address], [place], [place] [address]  [phone]

## 2014-10-26 NOTE — ED Provider Notes (Signed)
CSN: [id number]     Arrival date & time 10/26/14  1409 History   First MD Initiated Contact with Patient 10/26/14 1536     Chief Complaint  Patient presents with  . Medical Clearance     (Consider location/radiation/quality/duration/timing/severity/associated sxs/prior Treatment) The history is provided by a relative and a parent.   patient with long-standing history of schizophrenia. Family noted about a year ago that her functionality had changed significantly. Admitted March 11 for what turned out to be a pulmonary embolus. Started on Zarontin. On Saturday was discharged to group home . At his group home does not have locked doors does not have anybody awake at night the patient is a wanderer that was known before hand. Also there is a leak on the property. This could be dangerous if the patient wandered into it. Owner of group home has referred patient back in for better placement. Patient not known to be suicidal or homicidal. Patient's had a significant functional change in the last year no longer able to cook for herself cannot take any of her meds without supervision and patient cannot bathe herself properly.    Past Medical History  Diagnosis Date  . Paranoid schizophrenia   . Venous insufficiency of right lower extremity   . Hypertension   . Hyponatremia   . Pulmonary embolus    History reviewed. No pertinent past surgical history. Family History  Problem Relation Age of Onset  . Atrial fibrillation Father    History  Substance Use Topics  . Smoking status: Current Every Day Smoker -- 1.00 packs/day    Types: Cigarettes  . Smokeless tobacco: Not on file  . Alcohol Use: No   OB History    No data available     Review of Systems  Unable to perform ROS  due to patient schizophrenia patient not a reliable on review of systems. Level V caveat applies   Allergies  Phosphate; Artane; and Codeine  Home Medications   Prior to Admission medications   Medication Sig  Start Date End Date Taking? Authorizing Provider  amLODipine (NORVASC) 10 MG tablet Take 10 mg by mouth daily.    Historical Provider, MD  ARIPiprazole (ABILIFY MAINTENA) 400 MG SUSR Inject 400 mg into the muscle every 28 (twenty-eight) days. 11/10/14   Calvert CantorSaima Rizwan, MD  benztropine (COGENTIN) 1 MG tablet Take 0.5 tablets (0.5 mg total) by mouth daily. 10/20/14   Calvert CantorSaima Rizwan, MD  LORazepam (ATIVAN) 1 MG tablet Take 1 tablet (1 mg total) by mouth every 4 (four) hours as needed for anxiety or sleep. 10/20/14   Calvert CantorSaima Rizwan, MD  metoprolol tartrate (LOPRESSOR) 25 MG tablet Take 50 mg by mouth daily.     Historical Provider, MD  risperiDONE (RISPERDAL) 2 MG tablet Take 1 tablet (2 mg total) by mouth 2 (two) times daily. 10/20/14   Calvert CantorSaima Rizwan, MD  Rivaroxaban (XARELTO) 15 MG TABS tablet Take 1 tablet (15 mg total) by mouth 2 (two) times daily with a meal. 10/20/14 11/06/16  Calvert CantorSaima Rizwan, MD  rivaroxaban (XARELTO) 20 MG TABS tablet Take 1 tablet (20 mg total) by mouth daily with supper. 11/08/14   Calvert CantorSaima Rizwan, MD  triamcinolone cream (KENALOG) 0.1 % Apply 1 application topically 2 (two) times daily.  09/28/14   Historical Provider, MD   BP 122/66 mmHg  Pulse 94  Temp(Src) 97.8 F (36.6 C) (Oral)  Resp 18  SpO2 98%  LMP 12/29/2013 Physical Exam  Constitutional: She is oriented to person, place, and  time. She appears well-developed and well-nourished. No distress.  HENT:  Head: Normocephalic and atraumatic.  Mouth/Throat: Oropharynx is clear and moist.  Eyes: Conjunctivae and EOM are normal. Pupils are equal, round, and reactive to light.  Neck: Normal range of motion.  Cardiovascular: Normal rate, regular rhythm and normal heart sounds.   No murmur heard. Pulmonary/Chest: Effort normal and breath sounds normal. No respiratory distress.  Abdominal: Soft. Bowel sounds are normal.  Musculoskeletal: Normal range of motion.  Neurological: She is alert and oriented to person, place, and time. No cranial  nerve deficit. She exhibits normal muscle tone. Coordination normal.  Skin: Skin is warm. No rash noted.  Nursing note and vitals reviewed.   ED Course  Procedures (including critical care time) Labs Review Labs Reviewed  CBC WITH DIFFERENTIAL/PLATELET - Abnormal; Notable for the following:    Platelets 405 (*)    All other components within normal limits  BASIC METABOLIC PANEL - Abnormal; Notable for the following:    Sodium 129 (*)    Chloride 94 (*)    Glucose, Bld 139 (*)    GFR calc non Af Amer 75 (*)    GFR calc Af Amer 87 (*)    All other components within normal limits   Results for orders placed or performed during the hospital encounter of 10/26/14  CBC with Differential/Platelet  Result Value Ref Range   WBC 8.3 4.0 - 10.5 K/uL   RBC 4.47 3.87 - 5.11 MIL/uL   Hemoglobin 13.4 12.0 - 15.0 g/dL   HCT 16.1 09.6 - 04.5 %   MCV 90.4 78.0 - 100.0 fL   MCH 30.0 26.0 - 34.0 pg   MCHC 33.2 30.0 - 36.0 g/dL   RDW 40.9 81.1 - 91.4 %   Platelets 405 (H) 150 - 400 K/uL   Neutrophils Relative % 67 43 - 77 %   Neutro Abs 5.6 1.7 - 7.7 K/uL   Lymphocytes Relative 21 12 - 46 %   Lymphs Abs 1.7 0.7 - 4.0 K/uL   Monocytes Relative 10 3 - 12 %   Monocytes Absolute 0.8 0.1 - 1.0 K/uL   Eosinophils Relative 2 0 - 5 %   Eosinophils Absolute 0.1 0.0 - 0.7 K/uL   Basophils Relative 0 0 - 1 %   Basophils Absolute 0.0 0.0 - 0.1 K/uL  Basic metabolic panel  Result Value Ref Range   Sodium 129 (L) 135 - 145 mmol/L   Potassium 3.7 3.5 - 5.1 mmol/L   Chloride 94 (L) 96 - 112 mmol/L   CO2 27 19 - 32 mmol/L   Glucose, Bld 139 (H) 70 - 99 mg/dL   BUN 15 6 - 23 mg/dL   Creatinine, Ser 7.82 0.50 - 1.10 mg/dL   Calcium 8.5 8.4 - 95.6 mg/dL   GFR calc non Af Amer 75 (L) >90 mL/min   GFR calc Af Amer 87 (L) >90 mL/min   Anion gap 8 5 - 15     Imaging Review No results found.   EKG Interpretation None      MDM   Final diagnoses:  None    Patient brought in by family members.  Patient discharged on Saturday and placed in a group home. This environment is not safe for her because she is a wander at night and nobody is awake to prevent her from leaving the group home and there's lakes nearby. Patient has attempted to leave group home several times already. Patient has a history of  schizophrenia. Patient has a history of hyponatremia. Family states she drinks a lot of fluids and a day this may contributed to that. Patient was admitted the March 11 ended up having a pulmonary embolus. Started on Xarelto. Seen by psychiatry at that time rest for all was increased. Patient still not sleeping well at night and wandering at the group home. This is an unsafe environment for her.  Patient either needs placement in a different environment that'll be locked in safe to prevent her from wandering or she needs medication adjustment so that she'll sleep at night. Have requested psychiatric consultation. It appears at this point in time that will not occur until morning. Patient's usual meds have been ordered along with cycle meds. There is no concern for suicidal or homicidal ideation.  The main concern is safety of the environment for the patient.  In addition patient schizophrenia starting about a year ago seems to have had some significant functional changes patient no longer to care for herself. Cannot bathe can feed herself but can't cook cannot take any of her medicines without supervision.    Vanetta Mulders, MD 10/26/14 [phone]

## 2014-10-27 DIAGNOSIS — G4700 Insomnia, unspecified: Secondary | ICD-10-CM | POA: Diagnosis not present

## 2014-10-27 DIAGNOSIS — F200 Paranoid schizophrenia: Secondary | ICD-10-CM | POA: Diagnosis not present

## 2014-10-27 DIAGNOSIS — F209 Schizophrenia, unspecified: Secondary | ICD-10-CM | POA: Diagnosis not present

## 2014-10-27 LAB — BLOOD GAS, ARTERIAL
Acid-Base Excess: 3.2 mmol/L — ABNORMAL HIGH (ref 0.0–2.0)
Bicarbonate: 28.4 mEq/L — ABNORMAL HIGH (ref 20.0–24.0)
DRAWN BY: 295031
FIO2: 0.21 %
O2 Saturation: 92.4 %
PATIENT TEMPERATURE: 98.6
PCO2 ART: 47.6 mmHg — AB (ref 35.0–45.0)
PH ART: 7.393 (ref 7.350–7.450)
TCO2: 25.3 mmol/L (ref 0–100)
pO2, Arterial: 66 mmHg — ABNORMAL LOW (ref 80.0–100.0)

## 2014-10-27 NOTE — ED Notes (Signed)
Patient provided sandwich tray and PO fluids.

## 2014-10-27 NOTE — Progress Notes (Signed)
CSW reached out to [facility] at [phone] and spoke with clinician  Claris CheMargaret. CSW informed clinician that the pt is in need of a care coordinator.   Clinician collected demographics over the phone and states that a staff member from [facility] will reach out to the family regarding information about getting a care coordinator.  CSW will inform family that [facility] states that they will be in touch within a week.   Trish MageBrittney Uvaldo Rybacki, LCSWA [phone] ED CSW 10/27/2014 3:45 PM

## 2014-10-27 NOTE — Discharge Planning (Signed)
Nancy Cohnamellia Gracin [place]oo, RN, BSN, [place] [phone]. NCM consulted for home oxygen for pt.  Pt can not qualify for home oxygen from ED as it is an acute setting.  Advised to have pt make appointment with PCP to qualify for home oxygen.

## 2014-10-27 NOTE — ED Notes (Signed)
Pt requested shower,completed. sister at bedside

## 2014-10-27 NOTE — ED Notes (Signed)
During the assessment noted pt's right leg is swollen and bruised, warm to touch. Dr Anitra LauthPlunkett was notified.

## 2014-10-27 NOTE — Consult Note (Signed)
[facility] Consult   Reason for Consult:  Unspecified insomnia, Schizophrenia Chronic Referring Physician:  EDP Patient Identification: Nancy Erickson MRN:  [id number] Principal Diagnosis: Schizophrenia, Paranoid, Diagnosis:   Patient Active Problem List   Diagnosis Date Noted  . Schizo-affective schizophrenia [F25.0] 10/12/2013    Priority: High  . 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 Cardiomyopathy [I42.9]   . Pulmonary hypertension [I27.0]   . Pulmonary embolus [I26.99]   . Paranoid schizophrenia [F20.0]   . Hypertension, uncontrolled [I10] 10/14/2014  . Hyponatremia [E87.1] 10/12/2013  . Altered mental state [R41.82] 10/12/2013  . HTN (hypertension) [I10] 10/11/2013  . Hypertensive urgency [I10] 10/11/2013    Total Time spent with patient: 45 minutes  Subjective:   Nancy Erickson is a 59 y.o. female patient admitted with UNSPECIFIED INSOMNIA, Hx of Schizophrenia.  HPI: Caucasian female, 67 was brought in by her family members for insomnia and wandering at night.  They reported that patient is in a danger of wandering into a pond in front of a "care home" she is staying at.  Patient was recently discharged from [facility] after treatment for Pulmonary embolism.  Patient was brought back due to concerns with her wondering at night and not sleeping.  Patient was seen this morning calm with the father in the room.  She did not participate in the interview and her father was requesting that patient be admitted until they can find a new housing for her.  Father also wanted patient to be given sleeping medications.   Patient denied SI/HI/AVH and she was smiling but made minimal eye contact with providers.  Nurse taking care of patient reported that her Oxygen Saturation dropped down to 77 last night as she was sleeping.  Based on her recent treatment for Pulmonary Embolism and the Oxygen issue, EDP was informed to re-evaluate and Psychiatry will hold  off any sedating or sleep medications.  HPI Elements:   Location:  Insomnia, unspecified, Chronic Schizophrenia. Quality:  severe-moderate. Severity:  Moderate-severe. Timing:  Acute. Duration:  Chronic mental illness. Context:  Seeking treatment for insomnia.  Past Medical History:  Past Medical History  Diagnosis Date  . Paranoid schizophrenia   . Venous insufficiency of right lower extremity   . Hypertension   . Hyponatremia   . Pulmonary embolus    History reviewed. No pertinent past surgical history. Family History:  Family History  Problem Relation Age of Onset  . Atrial fibrillation Father    Social History:  History  Alcohol Use No     History  Drug Use No    History   Social History  . Marital Status: Single    Spouse Name: N/A  . Number of Children: N/A  . Years of Education: N/A   Social History Main Topics  . Smoking status: Current Every Day Smoker -- 1.00 packs/day    Types: Cigarettes  . Smokeless tobacco: Not on file  . Alcohol Use: No  . Drug Use: No  . Sexual Activity: Not on file   Other Topics Concern  . None   Social History Narrative   Additional Social History:                          Allergies:   Allergies  Allergen Reactions  . Phosphate Nausea And Vomiting  . Artane [Trihexyphenidyl] Other (See Comments)    unknown  . Codeine Other (See Comments)    unknown    Labs:  Results for orders placed or performed during the hospital encounter of 10/26/14 (from the past 48 hour(s))  CBC with Differential/Platelet     Status: Abnormal   Collection Time: 10/26/14  2:39 PM  Result Value Ref Range   WBC 8.3 4.0 - 10.5 K/uL   RBC 4.47 3.87 - 5.11 MIL/uL   Hemoglobin 13.4 12.0 - 15.0 g/dL   HCT 40.4 36.0 - 46.0 %   MCV 90.4 78.0 - 100.0 fL   MCH 30.0 26.0 - 34.0 pg   MCHC 33.2 30.0 - 36.0 g/dL   RDW 13.8 11.5 - 15.5 %   Platelets 405 (H) 150 - 400 K/uL   Neutrophils Relative % 67 43 - 77 %   Neutro Abs 5.6 1.7 - 7.7  K/uL   Lymphocytes Relative 21 12 - 46 %   Lymphs Abs 1.7 0.7 - 4.0 K/uL   Monocytes Relative 10 3 - 12 %   Monocytes Absolute 0.8 0.1 - 1.0 K/uL   Eosinophils Relative 2 0 - 5 %   Eosinophils Absolute 0.1 0.0 - 0.7 K/uL   Basophils Relative 0 0 - 1 %   Basophils Absolute 0.0 0.0 - 0.1 K/uL  Basic metabolic panel     Status: Abnormal   Collection Time: 10/26/14  2:39 PM  Result Value Ref Range   Sodium 129 (L) 135 - 145 mmol/L   Potassium 3.7 3.5 - 5.1 mmol/L   Chloride 94 (L) 96 - 112 mmol/L   CO2 27 19 - 32 mmol/L   Glucose, Bld 139 (H) 70 - 99 mg/dL   BUN 15 6 - 23 mg/dL   Creatinine, Ser 0.85 0.50 - 1.10 mg/dL   Calcium 8.5 8.4 - 10.5 mg/dL   GFR calc non Af Amer 75 (L) >90 mL/min   GFR calc Af Amer 87 (L) >90 mL/min    Comment: (NOTE) The eGFR has been calculated using the CKD EPI equation. This calculation has not been validated in all clinical situations. eGFR's persistently <90 mL/min signify possible Chronic Kidney Disease.    Anion gap 8 5 - 15  Urine rapid drug screen (hosp performed)     Status: None   Collection Time: 10/26/14  7:36 PM  Result Value Ref Range   Opiates NONE DETECTED NONE DETECTED   Cocaine NONE DETECTED NONE DETECTED   Benzodiazepines NONE DETECTED NONE DETECTED   Amphetamines NONE DETECTED NONE DETECTED   Tetrahydrocannabinol NONE DETECTED NONE DETECTED   Barbiturates NONE DETECTED NONE DETECTED    Comment:        DRUG SCREEN FOR MEDICAL PURPOSES ONLY.  IF CONFIRMATION IS NEEDED FOR ANY PURPOSE, NOTIFY LAB WITHIN 5 DAYS.        LOWEST DETECTABLE LIMITS FOR URINE DRUG SCREEN Drug Class       Cutoff (ng/mL) Amphetamine      1000 Barbiturate      200 Benzodiazepine   801 Tricyclics       655 Opiates          300 Cocaine          300 THC              50     Vitals: Blood pressure 159/89, pulse 99, temperature 98.1 F (36.7 C), temperature source Oral, resp. rate 20, last menstrual period 12/29/2013, SpO2 93 %.  Risk to Self: Is  patient at risk for suicide?: No Risk to Others:   Prior Inpatient Therapy:   Prior Outpatient Therapy:  Current Facility-Administered Medications  Medication Dose Route Frequency Provider Last Rate Last Dose  . 0.9 %  sodium chloride infusion   Intravenous Continuous Fredia Sorrow, MD 100 mL/hr at 10/26/14 1624    . acetaminophen (TYLENOL) tablet 650 mg  650 mg Oral Q4H PRN Fredia Sorrow, MD      . amLODipine (NORVASC) tablet 10 mg  10 mg Oral Daily Fredia Sorrow, MD   10 mg at 10/27/14 1042  . benztropine (COGENTIN) tablet 0.5 mg  0.5 mg Oral Daily Fredia Sorrow, MD   0.5 mg at 10/27/14 1043  . isosorbide mononitrate (ISMO,MONOKET) tablet 10 mg  10 mg Oral Daily Fredia Sorrow, MD   10 mg at 10/27/14 1042  . LORazepam (ATIVAN) tablet 1 mg  1 mg Oral Q4H PRN Fredia Sorrow, MD   1 mg at 10/26/14 2156  . metoprolol tartrate (LOPRESSOR) tablet 50 mg  50 mg Oral BID Fredia Sorrow, MD   50 mg at 10/27/14 1042  . ondansetron (ZOFRAN) tablet 4 mg  4 mg Oral Q8H PRN Fredia Sorrow, MD      . risperiDONE (RISPERDAL) tablet 2 mg  2 mg Oral BID Fredia Sorrow, MD   2 mg at 10/27/14 1043  . Rivaroxaban (XARELTO) tablet 15 mg  15 mg Oral BID WC Fredia Sorrow, MD   15 mg at 10/27/14 0850  . zolpidem (AMBIEN) tablet 5 mg  5 mg Oral QHS PRN Fredia Sorrow, MD       Current Outpatient Prescriptions  Medication Sig Dispense Refill  . amLODipine (NORVASC) 10 MG tablet Take 10 mg by mouth daily.    . benztropine (COGENTIN) 1 MG tablet Take 0.5 tablets (0.5 mg total) by mouth daily. 30 tablet 0  . isosorbide mononitrate (ISMO,MONOKET) 10 MG tablet Take 10 mg by mouth 2 (two) times daily.    Marland Kitchen LORazepam (ATIVAN) 1 MG tablet Take 1 tablet (1 mg total) by mouth every 4 (four) hours as needed for anxiety or sleep. 30 tablet 0  . metoprolol (LOPRESSOR) 50 MG tablet Take 50 mg by mouth daily.    . risperiDONE (RISPERDAL) 2 MG tablet Take 1 tablet (2 mg total) by mouth 2 (two) times daily. 60  tablet 0  . [START ON 11/08/2014] rivaroxaban (XARELTO) 20 MG TABS tablet Take 1 tablet (20 mg total) by mouth daily with supper. 30 tablet 0  . triamcinolone cream (KENALOG) 0.1 % Apply 1 application topically 2 (two) times daily.     Derrill Memo ON 11/10/2014] ARIPiprazole (ABILIFY MAINTENA) 400 MG SUSR Inject 400 mg into the muscle every 28 (twenty-eight) days. 1 each 0  . Rivaroxaban (XARELTO) 15 MG TABS tablet Take 1 tablet (15 mg total) by mouth 2 (two) times daily with a meal. 40 tablet 0    Musculoskeletal: Strength & Muscle Tone: within normal limits Gait & Station: normal Patient leans: N/A  Psychiatric Specialty Exam:     Blood pressure 159/89, pulse 99, temperature 98.1 F (36.7 C), temperature source Oral, resp. rate 20, last menstrual period 12/29/2013, SpO2 93 %.There is no weight on file to calculate BMI.  General Appearance: Casual  Eye Contact::  Minimal  Speech:  sminling more than speaking  Volume:  smiling more than speaking  Mood:  Depressed  Affect:  Congruent  Thought Process:  unable to obtain, patient wanted her dad to answer all questions.  Orientation:  Other:  unable to obtain  Thought Content:  unable to obtain, Patient did not participate in the interview.  Father was speaking for her.  Suicidal Thoughts:  No  Homicidal Thoughts:  No  Memory:  Immediate;   Fair Recent;   Poor Remote;   Poor  Judgement:  Impaired  Insight:  Shallow  Psychomotor Activity:  Normal  Concentration:  Fair  Recall:  NA  Fund of Knowledge:Fair  Language: minimal participation.  Akathisia:  NA  Handed:  Right  AIMS (if indicated):     Assets:  Desire for Improvement Housing  ADL's:  Intact  Cognition: Impaired,  Moderate  Sleep:      Medical Decision Making: Established Problem, Stable/Improving (1) and Review of Psycho-Social Stressors (1)  Treatment Plan Summary: SW involvement for placement.  Patient needing Oxygen daily  Plan:  SW placement Disposition: see  above  Delfin Gant    PMHNP-BC 10/27/2014 11:29 AM

## 2014-10-27 NOTE — Progress Notes (Signed)
CSW spoke with pt's father. Nurse was present. CSW informed father that the pt has been medically and psychiatrically cleared.   Father is aware of plan for pt return to current group home, and that she will be transferred to another location/facility of [name]"/ [phone]. Dad was given the [facility] to call in case he had any questions regarding the pt's future care coordinator. Nurse gave pt's father discharge summary documents.   Patient came out of her room in the TCU area and began talking. Dad states that the pt's speech sounds [facility]  Trish MageBrittney Shayaan Parke, LCSWA [phone] ED CSW 10/27/2014 9:24 PM

## 2014-10-27 NOTE — Progress Notes (Signed)
CSW spoke with pt father and pt sister regarding pt disposition. Per psychiatrist patient is psychiatrically stable for discharge back to group home. Pt group home is requesting medications to help patient sleep at night. However pt has history of low oxygen levels. Psychiatrist and EDP recommend patient following up with outpatient provider for medication management in order to closely follow patient medication needs.   CSW consulted with RN CM regarding oxygen levels to determine if patient can get oxygen. RN CM to discuss with EDP regarding process of qualifying for oxygen.   Pt family is concerned regarding patient not having appropriate supervision for patient as patient wanders day and night. Patient family is concerned regarding pond at the current group home. Pt group home provider has offered another location for patient without water access to ensure safety. Pt family is also concerned about patient wandering while staff sleeping. Pt is not eligible for a locked facility at this time.   Olga CoasterKristen Damante Spragg, LCSW  Clinical Social Work  [facility] [phone]

## 2014-10-27 NOTE — ED Provider Notes (Signed)
Patient had a few readings of low oxygen last night and was placed on 2 L of oxygen. She does have a known PE is currently on several toe. However during the day patient is not acutely oxygen on but oxygen saturation still remains greater than 90.  Discussed with case management as well as social work and patient does not qualify for nighttime oxygen at this time. She will need to follow-up with her regular doctor Dr. Abigail Miyamotohacker for outpatient evaluation to see if she needs oxygen. Patient does have a red erythematous swollen right lower extremity. However patient had Dopplers done 2 weeks ago which were negative and I saw this patient for an unrelated event proximally 6 months ago in her leg appeared the same. It is not hot or tender and low concern for cellulitis at this time. Will defer to behavioral health for placement but apparently family is very concerned that the group home she lives and currently does not have enough supervision and they're scared she may end up hurting herself because she has started to roam at night.  Gwyneth SproutWhitney Geovanni Rahming, MD 10/27/14 [phone]

## 2014-10-27 NOTE — Progress Notes (Addendum)
CSW and EDP discussed current status of oxygen qualifcation. Patient to be discharged back to [facility]. CSW confirmed plan with [facility] who states she will continue to work with pt and family to ensure patient safety in the group home and patient will be transferred to the group home that is not close to water access. CSW staffed case with Chief Executive OfficerCSW director. Patient family requested [facility]. CSW called outpatient and was able to get apponitment for Wednesday November 23, 2014 at 12:15pm. Pt will also be placed on cancellation list.  Patient father to pick up patient around 5/530pm. Patient father asked csw to discuss with pt sister. CSW informed pt sister of current disposition. Pt to also follow up with PCP for referral to neurologist, and management of future oxygen needs.   Olga CoasterKristen Severino Paolo, LCSW  Clinical Social Work  [facility] [phone]

## 2014-10-27 NOTE — ED Notes (Signed)
Please call pt's sister Vernona RiegerLaura with updates [phone].

## 2014-11-03 DIAGNOSIS — I10 Essential (primary) hypertension: Secondary | ICD-10-CM | POA: Diagnosis not present

## 2014-11-03 DIAGNOSIS — G4700 Insomnia, unspecified: Secondary | ICD-10-CM | POA: Diagnosis not present

## 2014-11-03 DIAGNOSIS — F209 Schizophrenia, unspecified: Secondary | ICD-10-CM | POA: Diagnosis not present

## 2014-11-03 DIAGNOSIS — Z9109 Other allergy status, other than to drugs and biological substances: Secondary | ICD-10-CM | POA: Diagnosis not present

## 2014-11-03 DIAGNOSIS — E871 Hypo-osmolality and hyponatremia: Secondary | ICD-10-CM | POA: Diagnosis not present

## 2014-11-03 DIAGNOSIS — I2699 Other pulmonary embolism without acute cor pulmonale: Secondary | ICD-10-CM | POA: Diagnosis not present

## 2014-11-08 DIAGNOSIS — F209 Schizophrenia, unspecified: Secondary | ICD-10-CM | POA: Diagnosis not present

## 2014-11-09 DIAGNOSIS — F209 Schizophrenia, unspecified: Secondary | ICD-10-CM | POA: Diagnosis not present

## 2014-11-23 ENCOUNTER — Ambulatory Visit (HOSPITAL_COMMUNITY): Payer: Self-pay | Admitting: Psychiatry

## 2014-12-07 DIAGNOSIS — F209 Schizophrenia, unspecified: Secondary | ICD-10-CM | POA: Diagnosis not present

## 2015-01-04 DIAGNOSIS — F209 Schizophrenia, unspecified: Secondary | ICD-10-CM | POA: Diagnosis not present

## 2015-01-24 DIAGNOSIS — F209 Schizophrenia, unspecified: Secondary | ICD-10-CM | POA: Diagnosis not present

## 2015-02-01 DIAGNOSIS — F209 Schizophrenia, unspecified: Secondary | ICD-10-CM | POA: Diagnosis not present

## 2015-02-08 DIAGNOSIS — I2699 Other pulmonary embolism without acute cor pulmonale: Secondary | ICD-10-CM | POA: Diagnosis not present

## 2015-02-08 DIAGNOSIS — I10 Essential (primary) hypertension: Secondary | ICD-10-CM | POA: Diagnosis not present

## 2015-02-08 DIAGNOSIS — L989 Disorder of the skin and subcutaneous tissue, unspecified: Secondary | ICD-10-CM | POA: Diagnosis not present

## 2015-02-08 DIAGNOSIS — F209 Schizophrenia, unspecified: Secondary | ICD-10-CM | POA: Diagnosis not present

## 2015-03-01 DIAGNOSIS — F209 Schizophrenia, unspecified: Secondary | ICD-10-CM | POA: Diagnosis not present

## 2015-03-29 DIAGNOSIS — F209 Schizophrenia, unspecified: Secondary | ICD-10-CM | POA: Diagnosis not present

## 2015-03-30 DIAGNOSIS — I789 Disease of capillaries, unspecified: Secondary | ICD-10-CM | POA: Diagnosis not present

## 2015-04-25 DIAGNOSIS — F209 Schizophrenia, unspecified: Secondary | ICD-10-CM | POA: Diagnosis not present

## 2015-05-10 DIAGNOSIS — F209 Schizophrenia, unspecified: Secondary | ICD-10-CM | POA: Diagnosis not present

## 2015-05-10 DIAGNOSIS — T148 Other injury of unspecified body region: Secondary | ICD-10-CM | POA: Diagnosis not present

## 2015-05-10 DIAGNOSIS — Z23 Encounter for immunization: Secondary | ICD-10-CM | POA: Diagnosis not present

## 2015-05-10 DIAGNOSIS — I10 Essential (primary) hypertension: Secondary | ICD-10-CM | POA: Diagnosis not present

## 2015-05-23 DIAGNOSIS — F209 Schizophrenia, unspecified: Secondary | ICD-10-CM | POA: Diagnosis not present

## 2015-06-04 DIAGNOSIS — Z23 Encounter for immunization: Secondary | ICD-10-CM | POA: Diagnosis not present

## 2015-06-20 DIAGNOSIS — F209 Schizophrenia, unspecified: Secondary | ICD-10-CM | POA: Diagnosis not present

## 2015-08-09 DIAGNOSIS — F209 Schizophrenia, unspecified: Secondary | ICD-10-CM | POA: Diagnosis not present

## 2015-08-18 DIAGNOSIS — I10 Essential (primary) hypertension: Secondary | ICD-10-CM | POA: Diagnosis not present

## 2015-08-18 DIAGNOSIS — F209 Schizophrenia, unspecified: Secondary | ICD-10-CM | POA: Diagnosis not present

## 2015-08-18 DIAGNOSIS — F319 Bipolar disorder, unspecified: Secondary | ICD-10-CM | POA: Diagnosis not present

## 2015-08-18 DIAGNOSIS — E871 Hypo-osmolality and hyponatremia: Secondary | ICD-10-CM | POA: Diagnosis not present

## 2015-08-18 DIAGNOSIS — Z86711 Personal history of pulmonary embolism: Secondary | ICD-10-CM | POA: Diagnosis not present

## 2015-10-04 DIAGNOSIS — F209 Schizophrenia, unspecified: Secondary | ICD-10-CM | POA: Diagnosis not present

## 2015-10-25 DIAGNOSIS — F209 Schizophrenia, unspecified: Secondary | ICD-10-CM | POA: Diagnosis not present

## 2015-11-15 DIAGNOSIS — Z9109 Other allergy status, other than to drugs and biological substances: Secondary | ICD-10-CM | POA: Diagnosis not present

## 2015-11-15 DIAGNOSIS — F209 Schizophrenia, unspecified: Secondary | ICD-10-CM | POA: Diagnosis not present

## 2015-11-15 DIAGNOSIS — F319 Bipolar disorder, unspecified: Secondary | ICD-10-CM | POA: Diagnosis not present

## 2015-11-15 DIAGNOSIS — I10 Essential (primary) hypertension: Secondary | ICD-10-CM | POA: Diagnosis not present

## 2015-11-22 DIAGNOSIS — F209 Schizophrenia, unspecified: Secondary | ICD-10-CM | POA: Diagnosis not present

## 2015-12-20 DIAGNOSIS — F209 Schizophrenia, unspecified: Secondary | ICD-10-CM | POA: Diagnosis not present

## 2015-12-26 DIAGNOSIS — F209 Schizophrenia, unspecified: Secondary | ICD-10-CM | POA: Diagnosis not present

## 2016-01-17 DIAGNOSIS — F209 Schizophrenia, unspecified: Secondary | ICD-10-CM | POA: Diagnosis not present

## 2016-02-14 DIAGNOSIS — F209 Schizophrenia, unspecified: Secondary | ICD-10-CM | POA: Diagnosis not present

## 2016-03-13 DIAGNOSIS — F209 Schizophrenia, unspecified: Secondary | ICD-10-CM | POA: Diagnosis not present

## 2016-03-18 DIAGNOSIS — F209 Schizophrenia, unspecified: Secondary | ICD-10-CM | POA: Diagnosis not present

## 2016-04-10 DIAGNOSIS — F209 Schizophrenia, unspecified: Secondary | ICD-10-CM | POA: Diagnosis not present

## 2016-04-24 DIAGNOSIS — F209 Schizophrenia, unspecified: Secondary | ICD-10-CM | POA: Diagnosis not present

## 2016-05-08 DIAGNOSIS — F209 Schizophrenia, unspecified: Secondary | ICD-10-CM | POA: Diagnosis not present

## 2016-05-15 DIAGNOSIS — Z23 Encounter for immunization: Secondary | ICD-10-CM | POA: Diagnosis not present

## 2016-05-15 DIAGNOSIS — F319 Bipolar disorder, unspecified: Secondary | ICD-10-CM | POA: Diagnosis not present

## 2016-05-15 DIAGNOSIS — Z720 Tobacco use: Secondary | ICD-10-CM | POA: Diagnosis not present

## 2016-05-15 DIAGNOSIS — F209 Schizophrenia, unspecified: Secondary | ICD-10-CM | POA: Diagnosis not present

## 2016-05-15 DIAGNOSIS — I10 Essential (primary) hypertension: Secondary | ICD-10-CM | POA: Diagnosis not present

## 2016-08-09 DIAGNOSIS — F209 Schizophrenia, unspecified: Secondary | ICD-10-CM | POA: Diagnosis not present

## 2016-11-19 DIAGNOSIS — L719 Rosacea, unspecified: Secondary | ICD-10-CM | POA: Diagnosis not present

## 2016-11-19 DIAGNOSIS — I10 Essential (primary) hypertension: Secondary | ICD-10-CM | POA: Diagnosis not present

## 2016-11-19 DIAGNOSIS — F209 Schizophrenia, unspecified: Secondary | ICD-10-CM | POA: Diagnosis not present

## 2016-11-19 DIAGNOSIS — Z720 Tobacco use: Secondary | ICD-10-CM | POA: Diagnosis not present

## 2016-12-05 DIAGNOSIS — F209 Schizophrenia, unspecified: Secondary | ICD-10-CM | POA: Diagnosis not present

## 2017-01-16 DIAGNOSIS — F209 Schizophrenia, unspecified: Secondary | ICD-10-CM | POA: Diagnosis not present

## 2017-02-27 DIAGNOSIS — F209 Schizophrenia, unspecified: Secondary | ICD-10-CM | POA: Diagnosis not present

## 2017-04-03 DIAGNOSIS — F209 Schizophrenia, unspecified: Secondary | ICD-10-CM | POA: Diagnosis not present

## 2017-05-09 DIAGNOSIS — Z23 Encounter for immunization: Secondary | ICD-10-CM | POA: Diagnosis not present

## 2017-05-09 DIAGNOSIS — Z720 Tobacco use: Secondary | ICD-10-CM | POA: Diagnosis not present

## 2017-05-09 DIAGNOSIS — I10 Essential (primary) hypertension: Secondary | ICD-10-CM | POA: Diagnosis not present

## 2017-05-09 DIAGNOSIS — F209 Schizophrenia, unspecified: Secondary | ICD-10-CM | POA: Diagnosis not present

## 2017-05-09 DIAGNOSIS — I8310 Varicose veins of unspecified lower extremity with inflammation: Secondary | ICD-10-CM | POA: Diagnosis not present

## 2017-05-22 DIAGNOSIS — F209 Schizophrenia, unspecified: Secondary | ICD-10-CM | POA: Diagnosis not present

## 2017-06-12 DIAGNOSIS — F209 Schizophrenia, unspecified: Secondary | ICD-10-CM | POA: Diagnosis not present

## 2017-08-14 DIAGNOSIS — F209 Schizophrenia, unspecified: Secondary | ICD-10-CM | POA: Diagnosis not present

## 2017-08-20 DIAGNOSIS — F209 Schizophrenia, unspecified: Secondary | ICD-10-CM | POA: Diagnosis not present

## 2017-09-23 ENCOUNTER — Inpatient Hospital Stay (HOSPITAL_COMMUNITY)
Admission: EM | Admit: 2017-09-23 | Discharge: 2017-09-30 | DRG: 208 | Disposition: A | Discharge status: Home Health | Payer: Medicare Other | Attending: Internal Medicine | Admitting: Internal Medicine

## 2017-09-23 ENCOUNTER — Emergency Department (HOSPITAL_COMMUNITY): Payer: Medicare Other

## 2017-09-23 ENCOUNTER — Encounter (HOSPITAL_COMMUNITY): Payer: Self-pay | Admitting: Emergency Medicine

## 2017-09-23 ENCOUNTER — Other Ambulatory Visit: Payer: Self-pay

## 2017-09-23 ENCOUNTER — Inpatient Hospital Stay (HOSPITAL_COMMUNITY): Payer: Medicare Other

## 2017-09-23 DIAGNOSIS — G9341 Metabolic encephalopathy: Secondary | ICD-10-CM | POA: Diagnosis present

## 2017-09-23 DIAGNOSIS — Z01818 Encounter for other preprocedural examination: Secondary | ICD-10-CM | POA: Diagnosis not present

## 2017-09-23 DIAGNOSIS — R4182 Altered mental status, unspecified: Secondary | ICD-10-CM | POA: Diagnosis not present

## 2017-09-23 DIAGNOSIS — R4781 Slurred speech: Secondary | ICD-10-CM | POA: Diagnosis not present

## 2017-09-23 DIAGNOSIS — J918 Pleural effusion in other conditions classified elsewhere: Secondary | ICD-10-CM | POA: Diagnosis present

## 2017-09-23 DIAGNOSIS — Z86711 Personal history of pulmonary embolism: Secondary | ICD-10-CM | POA: Diagnosis not present

## 2017-09-23 DIAGNOSIS — I429 Cardiomyopathy, unspecified: Secondary | ICD-10-CM | POA: Diagnosis present

## 2017-09-23 DIAGNOSIS — F200 Paranoid schizophrenia: Secondary | ICD-10-CM | POA: Diagnosis not present

## 2017-09-23 DIAGNOSIS — F17210 Nicotine dependence, cigarettes, uncomplicated: Secondary | ICD-10-CM | POA: Diagnosis present

## 2017-09-23 DIAGNOSIS — J9602 Acute respiratory failure with hypercapnia: Secondary | ICD-10-CM | POA: Diagnosis not present

## 2017-09-23 DIAGNOSIS — J441 Chronic obstructive pulmonary disease with (acute) exacerbation: Secondary | ICD-10-CM | POA: Diagnosis not present

## 2017-09-23 DIAGNOSIS — J9811 Atelectasis: Secondary | ICD-10-CM | POA: Diagnosis not present

## 2017-09-23 DIAGNOSIS — J439 Emphysema, unspecified: Secondary | ICD-10-CM | POA: Diagnosis present

## 2017-09-23 DIAGNOSIS — I872 Venous insufficiency (chronic) (peripheral): Secondary | ICD-10-CM | POA: Diagnosis present

## 2017-09-23 DIAGNOSIS — I2699 Other pulmonary embolism without acute cor pulmonale: Secondary | ICD-10-CM | POA: Diagnosis present

## 2017-09-23 DIAGNOSIS — E871 Hypo-osmolality and hyponatremia: Secondary | ICD-10-CM | POA: Diagnosis present

## 2017-09-23 DIAGNOSIS — R928 Other abnormal and inconclusive findings on diagnostic imaging of breast: Secondary | ICD-10-CM | POA: Diagnosis present

## 2017-09-23 DIAGNOSIS — J189 Pneumonia, unspecified organism: Secondary | ICD-10-CM | POA: Diagnosis present

## 2017-09-23 DIAGNOSIS — R42 Dizziness and giddiness: Secondary | ICD-10-CM | POA: Diagnosis not present

## 2017-09-23 DIAGNOSIS — Z4682 Encounter for fitting and adjustment of non-vascular catheter: Secondary | ICD-10-CM | POA: Diagnosis not present

## 2017-09-23 DIAGNOSIS — K529 Noninfective gastroenteritis and colitis, unspecified: Secondary | ICD-10-CM | POA: Diagnosis present

## 2017-09-23 DIAGNOSIS — R188 Other ascites: Secondary | ICD-10-CM | POA: Diagnosis not present

## 2017-09-23 DIAGNOSIS — R0902 Hypoxemia: Secondary | ICD-10-CM

## 2017-09-23 DIAGNOSIS — R631 Polydipsia: Secondary | ICD-10-CM | POA: Diagnosis present

## 2017-09-23 DIAGNOSIS — Z7901 Long term (current) use of anticoagulants: Secondary | ICD-10-CM

## 2017-09-23 DIAGNOSIS — R1310 Dysphagia, unspecified: Secondary | ICD-10-CM | POA: Diagnosis present

## 2017-09-23 DIAGNOSIS — K219 Gastro-esophageal reflux disease without esophagitis: Secondary | ICD-10-CM | POA: Diagnosis not present

## 2017-09-23 DIAGNOSIS — J9601 Acute respiratory failure with hypoxia: Secondary | ICD-10-CM | POA: Diagnosis not present

## 2017-09-23 DIAGNOSIS — E876 Hypokalemia: Secondary | ICD-10-CM | POA: Diagnosis not present

## 2017-09-23 DIAGNOSIS — I10 Essential (primary) hypertension: Secondary | ICD-10-CM | POA: Diagnosis not present

## 2017-09-23 DIAGNOSIS — A09 Infectious gastroenteritis and colitis, unspecified: Secondary | ICD-10-CM | POA: Diagnosis not present

## 2017-09-23 DIAGNOSIS — J9690 Respiratory failure, unspecified, unspecified whether with hypoxia or hypercapnia: Secondary | ICD-10-CM

## 2017-09-23 DIAGNOSIS — I2782 Chronic pulmonary embolism: Secondary | ICD-10-CM | POA: Diagnosis not present

## 2017-09-23 DIAGNOSIS — J9621 Acute and chronic respiratory failure with hypoxia: Secondary | ICD-10-CM | POA: Diagnosis not present

## 2017-09-23 DIAGNOSIS — I272 Pulmonary hypertension, unspecified: Secondary | ICD-10-CM | POA: Diagnosis present

## 2017-09-23 DIAGNOSIS — J90 Pleural effusion, not elsewhere classified: Secondary | ICD-10-CM | POA: Diagnosis not present

## 2017-09-23 DIAGNOSIS — J9622 Acute and chronic respiratory failure with hypercapnia: Secondary | ICD-10-CM | POA: Diagnosis not present

## 2017-09-23 DIAGNOSIS — J9600 Acute respiratory failure, unspecified whether with hypoxia or hypercapnia: Secondary | ICD-10-CM | POA: Diagnosis present

## 2017-09-23 LAB — I-STAT CHEM 8, ED
BUN: 9 mg/dL (ref 6–20)
CALCIUM ION: 1.01 mmol/L — AB (ref 1.15–1.40)
CHLORIDE: 74 mmol/L — AB (ref 101–111)
Creatinine, Ser: 0.6 mg/dL (ref 0.44–1.00)
Glucose, Bld: 107 mg/dL — ABNORMAL HIGH (ref 65–99)
HEMATOCRIT: 45 % (ref 36.0–46.0)
Hemoglobin: 15.3 g/dL — ABNORMAL HIGH (ref 12.0–15.0)
Potassium: 4.3 mmol/L (ref 3.5–5.1)
SODIUM: 114 mmol/L — AB (ref 135–145)
TCO2: 32 mmol/L (ref 22–32)

## 2017-09-23 LAB — BASIC METABOLIC PANEL
Anion gap: 7 (ref 5–15)
BUN: 8 mg/dL (ref 6–20)
CHLORIDE: 80 mmol/L — AB (ref 101–111)
CO2: 29 mmol/L (ref 22–32)
Calcium: 7.5 mg/dL — ABNORMAL LOW (ref 8.9–10.3)
Creatinine, Ser: 0.34 mg/dL — ABNORMAL LOW (ref 0.44–1.00)
GFR calc Af Amer: >60 mL/min (ref >60)
GFR calc non Af Amer: >60 mL/min (ref >60)
GLUCOSE: 108 mg/dL — AB (ref 65–99)
Potassium: 4.2 mmol/L (ref 3.5–5.1)
Sodium: 116 mmol/L — CL (ref 135–145)

## 2017-09-23 LAB — COMPREHENSIVE METABOLIC PANEL
ALBUMIN: 3.4 g/dL — AB (ref 3.5–5.0)
ALT: 29 U/L (ref 14–54)
AST: 27 U/L (ref 15–41)
Alkaline Phosphatase: 84 U/L (ref 38–126)
Anion gap: 9 (ref 5–15)
BUN: 10 mg/dL (ref 6–20)
CHLORIDE: 75 mmol/L — AB (ref 101–111)
CO2: 30 mmol/L (ref 22–32)
Calcium: 8 mg/dL — ABNORMAL LOW (ref 8.9–10.3)
Creatinine, Ser: 0.41 mg/dL — ABNORMAL LOW (ref 0.44–1.00)
GFR calc non Af Amer: >60 mL/min (ref >60)
Glucose, Bld: 109 mg/dL — ABNORMAL HIGH (ref 65–99)
POTASSIUM: 4.1 mmol/L (ref 3.5–5.1)
SODIUM: 114 mmol/L — AB (ref 135–145)
Total Bilirubin: 0.4 mg/dL (ref 0.3–1.2)
Total Protein: 6.5 g/dL (ref 6.5–8.1)

## 2017-09-23 LAB — BLOOD GAS, ARTERIAL
ACID-BASE EXCESS: 6.7 mmol/L — AB (ref 0.0–2.0)
ACID-BASE EXCESS: 5.6 mmol/L — AB (ref 0.0–2.0)
ACID-BASE EXCESS: 6 mmol/L — AB (ref 0.0–2.0)
Bicarbonate: 28.4 mmol/L — ABNORMAL HIGH (ref 20.0–28.0)
Bicarbonate: 26.9 mmol/L (ref 20.0–28.0)
Bicarbonate: 28.8 mmol/L — ABNORMAL HIGH (ref 20.0–28.0)
DELIVERY SYSTEMS: POSITIVE
Drawn by: 23534
Drawn by: 22223
Expiratory PAP: 5
FIO2: 55
FIO2: 100
Inspiratory PAP: 16
LHR: 18 {breaths}/min
O2 Content: 3 L/min
O2 SAT: 87.7 %
O2 SAT: 99.8 %
O2 Saturation: 93.5 %
PCO2 ART: 69.9 mmHg — AB (ref 32.0–48.0)
PCO2 ART: 57.2 mmHg — AB (ref 32.0–48.0)
PEEP: 5 cmH2O
PH ART: 7.295 — AB (ref 7.350–7.450)
PH ART: 7.356 (ref 7.350–7.450)
PO2 ART: 60.8 mmHg — AB (ref 83.0–108.0)
Patient temperature: 37
Patient temperature: 37
VT: 550 mL
pCO2 arterial: 81.8 mmHg (ref 32.0–48.0)
pH, Arterial: 7.228 — ABNORMAL LOW (ref 7.350–7.450)
pO2, Arterial: 79.1 mmHg — ABNORMAL LOW (ref 83.0–108.0)
pO2, Arterial: 359 mmHg — ABNORMAL HIGH (ref 83.0–108.0)

## 2017-09-23 LAB — ETHANOL

## 2017-09-23 LAB — DIFFERENTIAL
BASOS PCT: 0 %
Basophils Absolute: 0 10*3/uL (ref 0.0–0.1)
EOS ABS: 0 10*3/uL (ref 0.0–0.7)
EOS PCT: 0 %
Lymphocytes Relative: 14 %
Lymphs Abs: 1.8 10*3/uL (ref 0.7–4.0)
MONO ABS: 0 10*3/uL — AB (ref 0.1–1.0)
Monocytes Relative: 0 %
NEUTROS PCT: 86 %
Neutro Abs: 10.8 10*3/uL — ABNORMAL HIGH (ref 1.7–7.7)

## 2017-09-23 LAB — CBC
HCT: 39.1 % (ref 36.0–46.0)
Hemoglobin: 13 g/dL (ref 12.0–15.0)
MCH: 28.9 pg (ref 26.0–34.0)
MCHC: 33.2 g/dL (ref 30.0–36.0)
MCV: 86.9 fL (ref 78.0–100.0)
Platelets: 310 10*3/uL (ref 150–400)
RBC: 4.5 MIL/uL (ref 3.87–5.11)
RDW: 13.4 % (ref 11.5–15.5)
WBC: 12.6 10*3/uL — ABNORMAL HIGH (ref 4.0–10.5)

## 2017-09-23 LAB — URINALYSIS, ROUTINE W REFLEX MICROSCOPIC
BILIRUBIN URINE: NEGATIVE
Bacteria, UA: NONE SEEN
Glucose, UA: NEGATIVE mg/dL
Hgb urine dipstick: NEGATIVE
Ketones, ur: NEGATIVE mg/dL
Leukocytes, UA: NEGATIVE
Nitrite: NEGATIVE
PH: 6 (ref 5.0–8.0)
Protein, ur: 100 mg/dL — AB
RBC / HPF: NONE SEEN RBC/hpf (ref 0–5)
SQUAMOUS EPITHELIAL / LPF: NONE SEEN
Specific Gravity, Urine: 1.025 (ref 1.005–1.030)

## 2017-09-23 LAB — RAPID URINE DRUG SCREEN, HOSP PERFORMED
AMPHETAMINES: NOT DETECTED
BARBITURATES: NOT DETECTED
Benzodiazepines: NOT DETECTED
Cocaine: NOT DETECTED
Opiates: NOT DETECTED
Tetrahydrocannabinol: NOT DETECTED

## 2017-09-23 LAB — PROTIME-INR
INR: 1.11
Prothrombin Time: 14.2 seconds (ref 11.4–15.2)

## 2017-09-23 LAB — TSH: TSH: 1.63 u[IU]/mL (ref 0.350–4.500)

## 2017-09-23 LAB — SODIUM, URINE, RANDOM: Sodium, Ur: <10 mmol/L

## 2017-09-23 LAB — D-DIMER, QUANTITATIVE: D-Dimer, Quant: 1.82 ug/mL-FEU — ABNORMAL HIGH (ref 0.00–0.50)

## 2017-09-23 LAB — I-STAT BETA HCG BLOOD, ED (MC, WL, AP ONLY)

## 2017-09-23 LAB — TRIGLYCERIDES: TRIGLYCERIDES: 50 mg/dL (ref <150)

## 2017-09-23 LAB — OSMOLALITY: OSMOLALITY: 247 mosm/kg — AB (ref 275–295)

## 2017-09-23 LAB — I-STAT TROPONIN, ED: TROPONIN I, POC: 0 ng/mL (ref 0.00–0.08)

## 2017-09-23 LAB — APTT: aPTT: 36 seconds (ref 24–36)

## 2017-09-23 LAB — I-STAT CG4 LACTIC ACID, ED: LACTIC ACID, VENOUS: 1.62 mmol/L (ref 0.5–1.9)

## 2017-09-23 MED ORDER — SODIUM CHLORIDE 0.9 % IV SOLN
INTRAVENOUS | Status: DC
  Administered 2017-09-23: 23:00:00 via INTRAVENOUS

## 2017-09-23 MED ORDER — ACETAMINOPHEN 650 MG RE SUPP
650.0000 mg | Freq: Four times a day (QID) | RECTAL | Status: DC | PRN
  Administered 2017-09-24: 650 mg via RECTAL
  Filled 2017-09-23: qty 1

## 2017-09-23 MED ORDER — ACETAMINOPHEN 325 MG PO TABS: 650.0000 mg | ORAL_TABLET | Freq: Four times a day (QID) | ORAL | Status: DC | PRN

## 2017-09-23 MED ORDER — FENTANYL BOLUS VIA INFUSION
50.0000 ug | INTRAVENOUS | Status: DC | PRN
  Administered 2017-09-24 (×2): 50 ug via INTRAVENOUS
  Filled 2017-09-23: qty 50

## 2017-09-23 MED ORDER — SODIUM CHLORIDE 0.9 % IV BOLUS (SEPSIS)
1000.0000 mL | Freq: Once | INTRAVENOUS | Status: AC
  Administered 2017-09-23: 1000 mL via INTRAVENOUS

## 2017-09-23 MED ORDER — METOPROLOL TARTRATE 25 MG PO TABS: 25.0000 mg | ORAL_TABLET | Freq: Two times a day (BID) | ORAL | Status: DC

## 2017-09-23 MED ORDER — HYDRALAZINE HCL 20 MG/ML IJ SOLN: 10.0000 mg | INTRAMUSCULAR | Status: DC | PRN

## 2017-09-23 MED ORDER — TRIAMCINOLONE ACETONIDE 0.1 % EX CREA
1.0000 "application " | TOPICAL_CREAM | Freq: Two times a day (BID) | CUTANEOUS | Status: DC
  Administered 2017-09-24 – 2017-09-30 (×11): 1 via TOPICAL
  Filled 2017-09-23 (×2): qty 15

## 2017-09-23 MED ORDER — SODIUM CHLORIDE 0.9% FLUSH
3.0000 mL | Freq: Two times a day (BID) | INTRAVENOUS | Status: DC
  Administered 2017-09-23 – 2017-09-30 (×13): 3 mL via INTRAVENOUS

## 2017-09-23 MED ORDER — IOPAMIDOL (ISOVUE-370) INJECTION 76%
100.0000 mL | Freq: Once | INTRAVENOUS | Status: AC | PRN
  Administered 2017-09-23: 100 mL via INTRAVENOUS

## 2017-09-23 MED ORDER — ENOXAPARIN SODIUM 80 MG/0.8ML ~~LOC~~ SOLN: 65.0000 mg | Freq: Once | SUBCUTANEOUS | Status: DC

## 2017-09-23 MED ORDER — FUROSEMIDE 10 MG/ML IJ SOLN: 40.0000 mg | Freq: Once | INTRAMUSCULAR | Status: DC

## 2017-09-23 MED ORDER — PROPOFOL 1000 MG/100ML IV EMUL
0.0000 ug/kg/min | INTRAVENOUS | Status: DC
  Administered 2017-09-23: 5 ug/kg/min via INTRAVENOUS

## 2017-09-23 MED ORDER — LORAZEPAM 2 MG/ML IJ SOLN
1.0000 mg | Freq: Once | INTRAMUSCULAR | Status: AC
  Administered 2017-09-23: 1 mg via INTRAVENOUS
  Filled 2017-09-23: qty 1

## 2017-09-23 MED ORDER — SODIUM CHLORIDE 0.9 % IV SOLN
25.0000 ug/h | INTRAVENOUS | Status: DC
  Administered 2017-09-23: 50 ug/h via INTRAVENOUS
  Administered 2017-09-25 – 2017-09-26 (×2): 100 ug/h via INTRAVENOUS
  Filled 2017-09-23: qty 50

## 2017-09-23 MED ORDER — ONDANSETRON HCL 4 MG PO TABS: 4.0000 mg | ORAL_TABLET | Freq: Four times a day (QID) | ORAL | Status: DC | PRN

## 2017-09-23 MED ORDER — ENOXAPARIN SODIUM 80 MG/0.8ML ~~LOC~~ SOLN
65.0000 mg | Freq: Two times a day (BID) | SUBCUTANEOUS | Status: DC
  Administered 2017-09-24 – 2017-09-26 (×5): 65 mg via SUBCUTANEOUS
  Filled 2017-09-23 (×5): qty 0.8

## 2017-09-23 MED ORDER — ONDANSETRON HCL 4 MG/2ML IJ SOLN: 4.0000 mg | Freq: Four times a day (QID) | INTRAMUSCULAR | Status: DC | PRN

## 2017-09-23 MED ORDER — FENTANYL CITRATE (PF) 2500 MCG/50ML IJ SOLN
INTRAMUSCULAR | Status: AC
  Filled 2017-09-23: qty 50

## 2017-09-23 MED ORDER — PIPERACILLIN-TAZOBACTAM 3.375 G IVPB 30 MIN
3.3750 g | Freq: Once | INTRAVENOUS | Status: DC
Start: 2017-09-23 — End: 2017-09-23

## 2017-09-23 MED ORDER — BENZTROPINE MESYLATE 1 MG PO TABS
0.5000 mg | ORAL_TABLET | Freq: Every day | ORAL | Status: DC
  Administered 2017-09-25 – 2017-09-30 (×6): 0.5 mg via ORAL
  Filled 2017-09-23 (×6): qty 1

## 2017-09-23 MED ORDER — SUCCINYLCHOLINE CHLORIDE 20 MG/ML IJ SOLN
120.0000 mg | Freq: Once | INTRAMUSCULAR | Status: AC
  Administered 2017-09-23: 120 mg via INTRAVENOUS

## 2017-09-23 MED ORDER — FENTANYL CITRATE (PF) 100 MCG/2ML IJ SOLN: 50.0000 ug | Freq: Once | INTRAMUSCULAR | Status: DC

## 2017-09-23 MED ORDER — SODIUM CHLORIDE 0.9% FLUSH: 3.0000 mL | INTRAVENOUS | Status: DC | PRN

## 2017-09-23 MED ORDER — PROPOFOL 1000 MG/100ML IV EMUL
INTRAVENOUS | Status: AC
  Administered 2017-09-23: 5 ug/kg/min via INTRAVENOUS
  Filled 2017-09-23: qty 100

## 2017-09-23 MED ORDER — RISPERIDONE 1 MG PO TABS
3.0000 mg | ORAL_TABLET | Freq: Two times a day (BID) | ORAL | Status: DC
  Administered 2017-09-24 – 2017-09-30 (×11): 3 mg via ORAL
  Filled 2017-09-23 (×8): qty 3
  Filled 2017-09-23: qty 6
  Filled 2017-09-23 (×9): qty 3

## 2017-09-23 MED ORDER — ETOMIDATE 2 MG/ML IV SOLN
20.0000 mg | Freq: Once | INTRAVENOUS | Status: AC
  Administered 2017-09-23: 20 mg via INTRAVENOUS

## 2017-09-23 MED ORDER — SODIUM CHLORIDE 0.9 % IV SOLN: 250.0000 mL | INTRAVENOUS | Status: DC | PRN

## 2017-09-23 MED ORDER — SODIUM CHLORIDE 0.9 % IV SOLN
1250.0000 mg | Freq: Once | INTRAVENOUS | Status: AC
  Administered 2017-09-23: 1250 mg via INTRAVENOUS
  Filled 2017-09-23: qty 1250

## 2017-09-23 MED ORDER — HYDROCERIN EX CREA
TOPICAL_CREAM | Freq: Every day | CUTANEOUS | Status: DC
Start: 2017-09-23 — End: 2017-09-30
  Administered 2017-09-28 – 2017-09-30 (×3): via TOPICAL
  Filled 2017-09-23 (×2): qty 113

## 2017-09-23 NOTE — Progress Notes (Signed)
ANTICOAGULATION CONSULT NOTE - Initial Consult  Pharmacy Consult for lovenox Indication: pulmonary embolus  Allergies  Allergen Reactions  . Phosphate Nausea And Vomiting  . Artane [Trihexyphenidyl] Other (See Comments)    unknown  . Codeine Other (See Comments)    unknown    Patient Measurements:   Dosing Weight: 63 kg  Vital Signs: BP: 155/89 (02/19 1341) Pulse Rate: 105 (02/19 1341)  Labs: Recent Labs    09/23/17 1040 09/23/17 1104  HGB 13.0 15.3*  HCT 39.1 45.0  PLT 310  --   APTT 36  --   LABPROT 14.2  --   INR 1.11  --   CREATININE 0.41* 0.60    CrCl cannot be calculated (Unknown ideal weight.).   Medical History: Past Medical History:  Diagnosis Date  . Hypertension   . Hyponatremia   . Paranoid schizophrenia (HCC)   . Pulmonary embolus (HCC)   . Venous insufficiency of right lower extremity     Medications:  Was on xarelto PTA, last dose yesterday  Assessment: 62 yo lady to start lovenox for h/o PE while she is NPO.   Goal of Therapy:  Anti-Xa level 0.6-1 units/ml 4hrs after LMWH dose given Monitor platelets by anticoagulation protocol: Yes   Plan:  Lovenox 65 mg sq q12 hours CBC q MWF while on xarelto  Talbert CageSeay, Victoria Euceda Poteet 09/23/2017,3:07 PM

## 2017-09-23 NOTE — H&P (Addendum)
History and Physical    Nancy Erickson ZOX:[id number] DOB: [birth year] DOA: 09/23/2017  PCP: Henrine Screws, MD   Patient coming from: Group home  Chief Complaint: Dizzy/Slurred speech/Confusion  HPI: Nancy Erickson is a 62 y.o. 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  Patient is currently unresponsive on BiPAP and careworkers at bedside state that she has been drinking an excessive amount of water recently.  She has been taking her medications as otherwise prescribed including her Xarelto.   ED Course: ABG demonstrates severe hypercapnia and patient has been placed on BiPAP support.  She is otherwise unresponsive with stable VS. Labs remarkable for sodium of 114. She has been given 1 L of NS and was empirically started on Vancomycin and Zosyn. Head CT with no acute CVA noted and Chest CT with bilateral pleural effusions noted right greater than left along with a new breast mass on the left side.  There was a great deal of motion artifact and there appears to be a possibility of distal pulmonary emboli, but this is uncertain.  Review of Systems: Cannot be adequately obtained due to patient condition.  Past Medical History:  Diagnosis Date  . Hypertension   . Hyponatremia   . Paranoid schizophrenia (HCC)   . Pulmonary embolus (HCC)   . Venous insufficiency of right lower extremity     History reviewed. No pertinent surgical history.   reports that she has been smoking cigarettes.  She has been smoking about 1.00 pack per day. She does not have any smokeless tobacco history on file. She reports that she does not drink alcohol or use drugs.  Allergies  Allergen Reactions  . Phosphate  Nausea And Vomiting  . Artane [Trihexyphenidyl] Other (See Comments)    unknown  . Codeine Other (See Comments)    unknown    Family History  Problem Relation Age of Onset  . Atrial fibrillation Father     Prior to Admission medications   Medication Sig Start Date End Date Taking? Authorizing Provider  amLODipine (NORVASC) 10 MG tablet Take 10 mg by mouth daily.   Yes [provider]  benztropine (COGENTIN) 1 MG tablet Take 0.5 tablets (0.5 mg total) by mouth daily. 10/20/14  Yes Calvert Cantor, MD  loratadine (CLARITIN) 10 MG tablet Take 10 mg by mouth daily.   Yes [provider]  LORazepam (ATIVAN) 1 MG tablet Take 1 tablet (1 mg total) by mouth every 4 (four) hours as needed for anxiety or sleep. Patient taking differently: Take 1 mg by mouth 3 (three) times daily as needed for anxiety.  10/20/14  Yes Calvert Cantor, MD  metoprolol tartrate (LOPRESSOR) 25 MG tablet Take 25 mg by mouth 2 (two) times daily.   Yes [provider]  risperiDONE (RISPERDAL) 3 MG tablet Take 3 mg by mouth 2 (two) times daily.   Yes [provider]  rivaroxaban (XARELTO) 20 MG TABS tablet Take 1 tablet (20 mg total) by mouth daily with supper. 11/08/14  Yes Calvert Cantor, MD  Skin Protectants, Misc. (EUCERIN) cream Apply topically daily. Tac/euc 0.1% 1:4 Apply sparingly to rash on lower legs   Yes [provider]  triamcinolone cream (KENALOG) 0.1 % Apply 1 application topically 2 (two) times daily.  09/28/14  Yes [provider]    Physical Exam: Vitals:   09/23/17 1100 09/23/17 1155 09/23/17 1200 09/23/17 1341  BP:   (!) 152/121 (!) 155/89  Pulse: 99 (!) 102 99 (!) 105  Resp: (!) 24 (!) 37 (!) 24 16  SpO2: 92% 97% 97% 95%    Constitutional: Unresponsive on BiPAP 12/5 55% Vitals:   09/23/17 1100 09/23/17 1155 09/23/17 1200 09/23/17 1341  BP:   (!) 152/121 (!) 155/89  Pulse: 99 (!) 102 99 (!) 105  Resp: (!) 24 (!) 37 (!) 24 16  SpO2: 92% 97% 97% 95%     Neck: normal, supple Respiratory: clear to auscultation bilaterally. Normal respiratory effort. No accessory muscle use.  Cardiovascular: Regular rate and rhythm, no murmurs. Scant extremity edema.  Abdomen: no tenderness, no distention. Bowel sounds positive.  Musculoskeletal:  No joint deformity upper and lower extremities.   Skin: no rashes, lesions, ulcers.  Psychiatric: Normal judgment and insight. Alert and oriented x 3. Normal mood.   Labs on Admission: I have personally reviewed following labs and imaging studies  CBC: Recent Labs  Lab 09/23/17 1040 09/23/17 1104  WBC 12.6*  --   NEUTROABS 10.8*  --   HGB 13.0 15.3*  HCT 39.1 45.0  MCV 86.9  --   PLT 310  --    Basic Metabolic Panel: Recent Labs  Lab 09/23/17 1040 09/23/17 1104  NA 114* 114*  K 4.1 4.3  CL 75* 74*  CO2 30  --   GLUCOSE 109* 107*  BUN 10 9  CREATININE 0.41* 0.60  CALCIUM 8.0*  --    GFR: CrCl cannot be calculated (Unknown ideal weight.). Liver Function Tests: Recent Labs  Lab 09/23/17 1040  AST 27  ALT 29  ALKPHOS 84  BILITOT 0.4  PROT 6.5  ALBUMIN 3.4*   No results for input(s): LIPASE, AMYLASE in the last 168 hours. No results for input(s): AMMONIA in the last 168 hours. Coagulation Profile: Recent Labs  Lab 09/23/17 1040  INR 1.11   Cardiac Enzymes: No results for input(s): CKTOTAL, CKMB, CKMBINDEX, TROPONINI in the last 168 hours. BNP (last 3 results) No results for input(s): PROBNP in the last 8760 hours. HbA1C: No results for input(s): HGBA1C in the last 72 hours. CBG: No results for input(s): GLUCAP in the last 168 hours. Lipid Profile: No results for input(s): CHOL, HDL, LDLCALC, TRIG, CHOLHDL, LDLDIRECT in the last 72 hours. Thyroid Function Tests: No results for input(s): TSH, T4TOTAL, FREET4, T3FREE, THYROIDAB in the last 72 hours. Anemia Panel: No results for input(s): VITAMINB12, FOLATE, FERRITIN, TIBC, IRON, RETICCTPCT in the last 72 hours. Urine  analysis:    Component Value Date/Time   COLORURINE YELLOW 11/07/2013 2103   APPEARANCEUR CLEAR 11/07/2013 2103   LABSPEC 1.009 11/07/2013 2103   PHURINE 6.5 11/07/2013 2103   GLUCOSEU NEGATIVE 11/07/2013 2103   HGBUR NEGATIVE 11/07/2013 2103   BILIRUBINUR NEGATIVE 11/07/2013 2103   KETONESUR NEGATIVE 11/07/2013 2103   PROTEINUR NEGATIVE 11/07/2013 2103   UROBILINOGEN 0.2 11/07/2013 2103   NITRITE NEGATIVE 11/07/2013 2103   LEUKOCYTESUR NEGATIVE 11/07/2013 2103    Radiological Exams on Admission: Ct Angio Chest Pe W And/or Wo Contrast  Result Date: 09/23/2017 CLINICAL DATA:  Dizziness. EXAM: CT ANGIOGRAPHY CHEST WITH CONTRAST TECHNIQUE: Multidetector CT imaging of the chest was performed using the standard protocol during bolus administration of intravenous contrast. Multiplanar CT image reconstructions and MIPs were obtained to evaluate the vascular anatomy. CONTRAST:  100mL ISOVUE-370 IOPAMIDOL (ISOVUE-370) INJECTION 76% COMPARISON:  CT scan of October 15, 2014. FINDINGS: Cardiovascular: There is no evidence of large central pulmonary embolus. However, due to persistent respiratory motion artifact, smaller peripheral pulmonary emboli cannot be excluded on the basis of this exam. There is no evidence of thoracic aortic dissection or aneurysm. Mediastinum/Nodes: No enlarged mediastinal, hilar, or axillary lymph nodes. Thyroid gland, trachea, and esophagus demonstrate no significant findings. Lungs/Pleura: No pneumothorax is noted. Minimal left pleural effusion is noted. Moderate right pleural effusion is noted with adjacent subsegmental atelectasis. Upper Abdomen: No acute abnormality. Musculoskeletal: No significant osseous abnormality is noted. There is interval development of 2.3 x 1.7 cm mass in the left breast. Review of the MIP images confirms the above findings. IMPRESSION: No evidence of large central pulmonary embolus. However, due to persistent respiratory motion artifact, smaller  peripheral pulmonary emboli cannot be excluded on the basis of this exam. Interval development of 2.3 x 1.7 cm mass in the left breast. Further evaluation with mammographic and ultrasound correlation is recommended. Moderate right pleural effusion is noted with adjacent subsegmental atelectasis. Electronically Signed   By: Lupita Raider, M.D.   On: 09/23/2017 13:29   Dg Chest Port 1 View  Result Date: 09/23/2017 CLINICAL DATA:  Altered mental status and slurred speech since 745 this morning. Dizziness last night. Elevated white blood cell count. EXAM: PORTABLE CHEST 1 VIEW COMPARISON:  PA and lateral chest 05/09/2017.  CT chest 10/15/2014. FINDINGS: Extensive airspace disease is present throughout the right chest. Bilateral pleural effusions are present, larger on the right. There is cardiomegaly and vascular congestion. No pneumothorax. No acute bony abnormality. IMPRESSION: Right pleural effusion and extensive airspace disease most worrisome for pneumonia. Small left pleural effusion and basilar atelectasis. Cardiomegaly and pulmonary vascular congestion. Electronically Signed   By: Drusilla Kanner M.D.   On: 09/23/2017 11:29   Ct Head Code Stroke Wo Contrast  Result Date: 09/23/2017 CLINICAL DATA:  Code stroke. Dizziness and balance disturbance beginning last night at 2030 hours. Some slurred speech. Clinical notes state this is not a code stroke exam. EXAM: CT HEAD WITHOUT CONTRAST TECHNIQUE: Contiguous axial images were obtained from the base of the skull through the vertex without intravenous contrast. COMPARISON:  10/14/2014 FINDINGS: Brain: Some motion degradation. Allowing for that, normal appearance of the brain without evidence of atrophy, old or acute infarction, mass lesion, hemorrhage, hydrocephalus or extra-axial collection. Vascular: No abnormal vascular finding. Skull: Normal Sinuses/Orbits: Clear/normal Other: None IMPRESSION: 1. Motion degraded study, but normal in appearance allowing  for that. Electronically Signed   By: Paulina Fusi M.D.   On: 09/23/2017 13:31    EKG: Independently reviewed. SR 95bpm.  Assessment/Plan Principal Problem:   Acute metabolic encephalopathy Active Problems:   HTN (hypertension)   Hyponatremia   Pulmonary embolus (HCC)   Paranoid schizophrenia (HCC)   Cardiomyopathy (HCC)   Pulmonary hypertension (HCC)    1. Acute encephalopathy metabolic and hypercapnic.  Focus on correcting underlying hyponatremia as well as hypercapnia as noted below.  Monitor in stepdown unit with neurochecks and consider intubation should patient not be able to protect her airway. 2. Severe hyponatremia secondary to psychogenic polydipsia.  Patient had previously been admitted with sodium of 110 back in March 2016 with resolution on a strict fluid restriction.  We will continue with this at this time (NPO) and not placed on any further IV fluid.  Will avoid hypertonic saline as patient does not have any risk of seizures with structural brain disease or prior history. May consider  this if sodium is not improving as expected.  Repeat BMP has been ordered for further sodium assessment after 1 L normal saline given in ED. UOsm, SOsm, Una, and TSH ordered. 3. Acute hypercapnic and hypoxemic respiratory failure.  Continue on BiPAP with repeat ABG ordered.  Monitor bilateral pleural effusions. Will give one dose IV Lasix 40mg  now. CXR and ABG repeat in AM. Highly doubt PE with Xarelto compliance.  4. Paranoid schizophrenia.  Hold home medications for now until she is more alert and awake. 5. Hypertension.  Hold current home medications and order hydralazine as needed for now.   DVT prophylaxis: On Xarelto at group home will place on full dose Lovenox for now due to NPO Code Status: Full Family Communication: Group home care workers at bedside Disposition Plan:Work on correcting Na levels Consults called:None Admission status: Inpatient; SDU   Sharon Stapel Hoover Brunette DO [facility] Pager [phone]  If 7PM-7AM, please contact night-coverage [email or web address] Password [email or web address]  09/23/2017, 2:21 PM

## 2017-09-23 NOTE — ED Provider Notes (Signed)
Emergency Department Provider Note   I have reviewed the triage vital signs and the nursing notes.   HISTORY  Chief Complaint No chief complaint on file.   HPI Nancy Erickson is a 62 y.o. female who presents the emergency department secondary to altered mental status and abnormal speech.  It sounds like the patient was last known to be well yesterday some time.  She had some speaking difficulty last night but resolved and this morning around 730 when she is eating they noticed that she is having trouble using her hand to hold the spoon and then at approximately 10:00 they noticed that she had garbled abnormal speech with slurring.  They brought her here for evaluation.  Patient not able to provide any history secondary to altered mental status.  LEVEL V CAVEAT 2/2 ALTERED MENTAL STATUS AND UNINTELLIGIBLE SPEECH    Past Medical History:  Diagnosis Date  . Hypertension   . Hyponatremia   . Paranoid schizophrenia (HCC)   . Pulmonary embolus (HCC)   . Venous insufficiency of right lower extremity     Patient Active Problem List   Diagnosis Date Noted  . Acute metabolic encephalopathy 09/23/2017  . Insomnia   . 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 Cardiomyopathy (HCC)   . Pulmonary hypertension (HCC)   . Pulmonary embolus (HCC)   . Paranoid schizophrenia (HCC)   . Hypertension, uncontrolled 10/14/2014  . Schizophrenia (HCC) 10/12/2013  . Hyponatremia 10/12/2013  . Altered mental state 10/12/2013  . HTN (hypertension) 10/11/2013  . Hypertensive urgency 10/11/2013    History reviewed. No pertinent surgical history.  Current Outpatient Rx  . Order #: [id number] Class: Historical Med  . Order #: [id number] Class: Print  . Order #: [id number] Class: Historical Med  . Order #: [id number] Class: Print  . Order #: [id number] Class: Historical Med  . Order #: [id number] Class: Historical Med  . Order #: [id number] Class: Print  . Order #: [id number] Class: Historical Med  . Order #:  [id number] Class: Historical Med    Allergies Phosphate; Artane [trihexyphenidyl]; and Codeine  Family History  Problem Relation Age of Onset  . Atrial fibrillation Father     Social History Social History   Tobacco Use  . Smoking status: Current Every Day Smoker    Packs/day: 1.00    Types: Cigarettes  Substance Use Topics  . Alcohol use: No  . Drug use: No    Review of Systems  LEVEL V CAVEAT 2/2 ALTERED MENTAL STATUS AND UNINTELLIGIBLE SPEECH ____________________________________________   PHYSICAL EXAM:  VITAL SIGNS: ED Triage Vitals  Enc Vitals Group     BP 09/23/17 1031 138/79     Pulse Rate 09/23/17 1031 (!) 101     Resp 09/23/17 1100 (!) 24     Temp --      Temp src --      SpO2 09/23/17 1031 (!) 72 %    Constitutional: Alert and disoriented. Well appearing and in mild respiratory distress. Eyes: Conjunctivae are normal. PERRL. EOMI. Head: Atraumatic. Nose: No congestion/rhinnorhea. Mouth/Throat: Mucous membranes are moist.  Oropharynx non-erythematous. Neck: No stridor.  No meningeal signs.   Cardiovascular: tachycardic rate, regular rhythm. Good peripheral circulation. Grossly normal heart sounds.   Respiratory: tachypneic and mild respiratory distress with hypoxia. No retractions. Lungs diminished in right. Gastrointestinal: Soft and nontender. No distention.  Musculoskeletal: No lower extremity tenderness nor edema. No gross deformities of extremities. Neurologic:  Slurred speech that also seems to be garbled and nonsensical at times. Not following commands well but is moving extremities  well.   Skin:  Skin is warm, moist and intact. No rash noted.  ____________________________________________   LABS (all labs ordered are listed, but only abnormal results are displayed)  Labs Reviewed  BLOOD GAS, ARTERIAL - Abnormal; Notable for the following components:      Result Value   pH, Arterial 7.295 (*)    pCO2 arterial 69.9 (*)    pO2, Arterial 60.8  (*)    Bicarbonate 28.4 (*)    Acid-Base Excess 6.7 (*)    All other components within normal limits  CBC - Abnormal; Notable for the following components:   WBC 12.6 (*)    All other components within normal limits  DIFFERENTIAL - Abnormal; Notable for the following components:   Neutro Abs 10.8 (*)    Monocytes Absolute 0.0 (*)    All other components within normal limits  COMPREHENSIVE METABOLIC PANEL - Abnormal; Notable for the following components:   Sodium 114 (*)    Chloride 75 (*)    Glucose, Bld 109 (*)    Creatinine, Ser 0.41 (*)    Calcium 8.0 (*)    Albumin 3.4 (*)    All other components within normal limits  D-DIMER, QUANTITATIVE (NOT AT [facility]) - Abnormal; Notable for the following components:   D-Dimer, Quant 1.82 (*)    All other components within normal limits  BASIC METABOLIC PANEL - Abnormal; Notable for the following components:   Sodium 116 (*)    Chloride 80 (*)    Glucose, Bld 108 (*)    Creatinine, Ser 0.34 (*)    Calcium 7.5 (*)    All other components within normal limits  I-STAT CHEM 8, ED - Abnormal; Notable for the following components:   Sodium 114 (*)    Chloride 74 (*)    Glucose, Bld 107 (*)    Calcium, Ion 1.01 (*)    Hemoglobin 15.3 (*)    All other components within normal limits  ETHANOL  PROTIME-INR  APTT  URINALYSIS, ROUTINE W REFLEX MICROSCOPIC  RAPID URINE DRUG SCREEN, HOSP PERFORMED  BLOOD GAS, ARTERIAL  BLOOD GAS, ARTERIAL  HIV ANTIBODY (ROUTINE TESTING)  TSH  SODIUM, URINE, RANDOM  OSMOLALITY, URINE  OSMOLALITY  I-STAT TROPONIN, ED  I-STAT BETA HCG BLOOD, ED ([facility], [facility], [facility] ONLY)  I-STAT CG4 LACTIC ACID, ED  I-STAT CG4 LACTIC ACID, ED   ____________________________________________  EKG   EKG Interpretation  Date/Time:  Tuesday September 23 2017 11:03:46 EST Ventricular Rate:  95 PR Interval:    QRS Duration: 82 QT Interval:  331 QTC Calculation: 416 R Axis:   94 Text Interpretation:  Sinus rhythm Borderline right  axis deviation Low voltage, precordial leads No significant change since last tracing Confirmed by Marily Memos [phone]) on 09/23/2017 11:36:50 AM       ____________________________________________  RADIOLOGY  Ct Angio Chest Pe W And/or Wo Contrast  Result Date: 09/23/2017 CLINICAL DATA:  Dizziness. EXAM: CT ANGIOGRAPHY CHEST WITH CONTRAST TECHNIQUE: Multidetector CT imaging of the chest was performed using the standard protocol during bolus administration of intravenous contrast. Multiplanar CT image reconstructions and MIPs were obtained to evaluate the vascular anatomy. CONTRAST:  ISOVUE-370 IOPAMIDOL (ISOVUE-370) INJECTION 76% COMPARISON:  CT scan of October 15, 2014. FINDINGS: Cardiovascular: There is no evidence of large central pulmonary embolus. However, due to persistent respiratory motion artifact, smaller peripheral pulmonary emboli cannot be excluded on the basis of this exam. There is no evidence of thoracic aortic dissection or aneurysm. Mediastinum/Nodes: No enlarged  mediastinal, hilar, or axillary lymph nodes. Thyroid gland, trachea, and esophagus demonstrate no significant findings. Lungs/Pleura: No pneumothorax is noted. Minimal left pleural effusion is noted. Moderate right pleural effusion is noted with adjacent subsegmental atelectasis. Upper Abdomen: No acute abnormality. Musculoskeletal: No significant osseous abnormality is noted. There is interval development of 2.3 x 1.7 cm mass in the left breast. Review of the MIP images confirms the above findings. IMPRESSION: No evidence of large central pulmonary embolus. However, due to persistent respiratory motion artifact, smaller peripheral pulmonary emboli cannot be excluded on the basis of this exam. Interval development of 2.3 x 1.7 cm mass in the left breast. Further evaluation with mammographic and ultrasound correlation is recommended. Moderate right pleural effusion is noted with adjacent subsegmental atelectasis.  Electronically Signed   By: Lupita Raider, M.D.   On: 09/23/2017 13:29   Dg Chest Port 1 View  Result Date: 09/23/2017 CLINICAL DATA:  Altered mental status and slurred speech since 745 this morning. Dizziness last night. Elevated white blood cell count. EXAM: PORTABLE CHEST 1 VIEW COMPARISON:  PA and lateral chest 05/09/2017.  CT chest 10/15/2014. FINDINGS: Extensive airspace disease is present throughout the right chest. Bilateral pleural effusions are present, larger on the right. There is cardiomegaly and vascular congestion. No pneumothorax. No acute bony abnormality. IMPRESSION: Right pleural effusion and extensive airspace disease most worrisome for pneumonia. Small left pleural effusion and basilar atelectasis. Cardiomegaly and pulmonary vascular congestion. Electronically Signed   By: Drusilla Kanner M.D.   On: 09/23/2017 11:29   Ct Head Code Stroke Wo Contrast  Result Date: 09/23/2017 CLINICAL DATA:  Code stroke. Dizziness and balance disturbance beginning last night at 2030 hours. Some slurred speech. Clinical notes state this is not a code stroke exam. EXAM: CT HEAD WITHOUT CONTRAST TECHNIQUE: Contiguous axial images were obtained from the base of the skull through the vertex without intravenous contrast. COMPARISON:  10/14/2014 FINDINGS: Brain: Some motion degradation. Allowing for that, normal appearance of the brain without evidence of atrophy, old or acute infarction, mass lesion, hemorrhage, hydrocephalus or extra-axial collection. Vascular: No abnormal vascular finding. Skull: Normal Sinuses/Orbits: Clear/normal Other: None IMPRESSION: 1. Motion degraded study, but normal in appearance allowing for that. Electronically Signed   By: Paulina Fusi M.D.   On: 09/23/2017 13:31    ____________________________________________   PROCEDURES  Procedure(s) performed:   Procedures  CRITICAL CARE Performed by: Marily Memos Total critical care time: 45 minutes Critical care time was  exclusive of separately billable procedures and treating other patients. Critical care was necessary to treat or prevent imminent or life-threatening deterioration. Critical care was time spent personally by me on the following activities: development of treatment plan with patient and/or surrogate as well as nursing, discussions with consultants, evaluation of patient's response to treatment, examination of patient, obtaining history from patient or surrogate, ordering and performing treatments and interventions, ordering and review of laboratory studies, ordering and review of radiographic studies, pulse oximetry and re-evaluation of patient's condition.  ____________________________________________   INITIAL IMPRESSION / ASSESSMENT AND PLAN / ED COURSE  Patient initially thought to be possible stroke however with unclear onset of symptoms code stroke was not called.  She subsequently found to be hypoxic on both saturation and ABG which did not respond well to 4 L of nasal cannula so started on BiPAP for that.  She is also found to be hyponatremic with a sodium 114 is the likely cause for her altered mental status.  Her chest x-ray CT  scan subsequently showed a right pleural effusion with atelectasis.  Antibiotics were given secondary to the consolidation on initial chest x-ray concerning for pneumonia.  She also found to have likely new left breast mass which could be related but no evidence of pulmonary embolus.  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  A liter of fluids were given for that.   Pertinent labs & imaging results that were available during my care of the patient were reviewed by me and considered in my medical decision making (see chart for details).  ____________________________________________  FINAL CLINICAL IMPRESSION(S) / ED DIAGNOSES  Final diagnoses:  Hyponatremia  Hypoxia  Altered  mental status, unspecified altered mental status type     MEDICATIONS GIVEN DURING THIS VISIT:  Medications  benztropine (COGENTIN) tablet 0.5 mg (not administered)  metoprolol tartrate (LOPRESSOR) tablet 25 mg (not administered)  risperiDONE (RISPERDAL) tablet 3 mg (not administered)  hydrocerin (EUCERIN) cream (not administered)  triamcinolone cream (KENALOG) 0.1 % 1 application (not administered)  sodium chloride flush (NS) 0.9 % injection 3 mL (not administered)  sodium chloride flush (NS) 0.9 % injection 3 mL (not administered)  0.9 %  sodium chloride infusion (not administered)  acetaminophen (TYLENOL) tablet 650 mg (not administered)    Or  acetaminophen (TYLENOL) suppository 650 mg (not administered)  ondansetron (ZOFRAN) tablet 4 mg (not administered)    Or  ondansetron (ZOFRAN) injection 4 mg (not administered)  furosemide (LASIX) injection 40 mg (not administered)  hydrALAZINE (APRESOLINE) injection 10 mg (not administered)  enoxaparin (LOVENOX) injection 65 mg (not administered)  sodium chloride 0.9 % bolus 1,000 mL (1,000 mLs Intravenous New Bag/Given 09/23/17 1153)  vancomycin (VANCOCIN) 1,250 mg in sodium chloride 0.9 % 250 mL IVPB (0 mg Intravenous Stopped 09/23/17 1326)  iopamidol (ISOVUE-370) 76 % injection 100 mL (100 mLs Intravenous Contrast Given 09/23/17 1304)  LORazepam (ATIVAN) injection 1 mg (1 mg Intravenous Given 09/23/17 1320)     NEW OUTPATIENT MEDICATIONS STARTED DURING THIS VISIT:  New Prescriptions   No medications on file    Note:  This note was prepared with assistance of Dragon voice recognition software. Occasional wrong-word or sound-a-like substitutions may have occurred due to the inherent limitations of voice recognition software.   Kaelob Persky, Barbara Cower, MD 09/23/17 [phone]

## 2017-09-23 NOTE — ED Triage Notes (Signed)
Dizzy and unbalanced last night at 830pm.  This morning pt was having trouble using spoon  and slurred speech at 745am

## 2017-09-23 NOTE — ED Notes (Signed)
CRITICAL VALUE ALERT  Critical Value:  Serum Osmolality 247  Date & Time Notied:  09/23/17 & 2205  Provider Notified: Dr. Robb Matarrtiz  Orders Received/Actions taken: N/A

## 2017-09-23 NOTE — ED Notes (Signed)
At approximately 2120 on 09/23/17 when to check on patient, patient was pale and unresponsive, O2 saturation was reading 15%, initiated a Code Blue and patient was intubated.

## 2017-09-23 NOTE — Progress Notes (Addendum)
Night shift stepdown coverage note  The patient was seen after a CODE BLUE was called while she was seen the emergency department in room 16 at around 2120.  She was found unresponsive and with an O2 sat reading of 15%.  Just before this we noticed that the patient was having emesis.  Dr. Linwood DibblesJon Knapp, who was on duty in the emergency department came to the room and intubated her for airway protection and hypoxia/unresponsiveness treatment.   Vital signs: 113 Pulse Abnormal     29 Resp Abnormal   138/74 mmHg  -  37 % Abnormal   -  -      General.  The patient was initially mildly cyanotic, but did recover after she was Ambu bagged.  Lungs: Good air entry while intubated and on mechanical ventilation with bilateral rhonchi.  No wheezing or rales. Cardiovascular S1-S2, tachycardic at 112 bpm, no murmurs. Abdomen was mildly distended, soft, nontender (although the patient was sedated with fentanyl and propofol) Extremities no edema or cyanosis. Neuro as stated above the patient was sedated.  Her lab work from earlier with reviewed.  Post intubation chest radiograph impression is below  IMPRESSION: 1. Endotracheal tube tip about 3.4 cm superior to carina. Esophageal tube tip projects over gastroduodenal region 2. Similar appearance of vascular congestion and mild pulmonary edema 3. Small right greater than left pleural effusions with right basilar atelectasis or pneumonia  Due to her abdominal distention and multiple episodes of emesis, I ordered a CT abdomen with contrast.  See impression results below  IMPRESSION: 1. Ascending colonic wall thickening consistent with colitis, may be infectious or inflammatory. 2. Small volume abdominopelvic ascites. 3. High density in the gallbladder likely vicarious excretion of contrast from prior chest CT. 4. Right posterior tenth rib fracture appears acute. 5.  Aortic Atherosclerosis (ICD10-I70.0).  Assessment:  Acute respiratory failure with  hypoxia Aspiration pneumonia. Acute colitis.  Plan  Continue treatment for hyponatremia. Continue mechanical ventilation and sedatives. Consult Dr. Juanetta GoslingHawkins for vent management. Unasyn for aspiration pneumonia and acute colitis.  I would like to thank Dr. Iantha FallenJohn Knapp for his assistance with endotracheal intubation.  Sanda Kleinavid Ortiz, MD  Over 55 minutes of critical care time were spent during this emergency.   This document was prepared using Dragon voice recognition software may contain some unintended errors.

## 2017-09-23 NOTE — ED Notes (Signed)
Patient vomiting up dark brown emesis. Hospitalist paged.

## 2017-09-23 NOTE — ED Notes (Signed)
Date and time results received: 09/23/17 3:21 PM  (use smartphrase ".now" to insert current time)  Test: Sodium Critical Value: 116  Name of Provider Notified: Sherryll BurgerShah  Orders Received? Or Actions Taken?: Orders Received - See Orders for details

## 2017-09-23 NOTE — ED Provider Notes (Signed)
Patient has been admitted to the hospitalist service.  She was still holding down in the emergency room.  I was called to the bedside because the nurse noted the patient's saturation was down in the 20s and she was having agonal breathing.  Patient was immediately started on bag valve mask ventilation with 100% oxygen.  It was noted the patient appeared to vomit and aspirate prior to the ventilation.  Patient's oxygen saturation was brought up into the low 90s.  I proceeded with intubation.  I was able to visualize the cords easily but there was difficulty in advancing the tube past the vocal cords.  Eventually on the third attempt with a 702 I was able to successfully intubate the patient.  Between each attempt the patient was bagged valve mask ventilated to make sure her oxygen saturation did not drop below 90.  Procedure Name: Intubation Date/Time: 09/23/2017 9:37 PM Performed by: Linwood DibblesKnapp, Erandy Mceachern, MD Pre-anesthesia Checklist: Patient identified, Patient being monitored, Emergency Drugs available, Timeout performed and Suction available Oxygen Delivery Method: Non-rebreather mask Preoxygenation: Pre-oxygenation with 100% oxygen Induction Type: Rapid sequence Ventilation: Mask ventilation without difficulty Laryngoscope Size: Glidescope Tube size: 7.0 mm Number of attempts: 3 Airway Equipment and Method: Video-laryngoscopy Placement Confirmation: ETT inserted through vocal cords under direct vision,  CO2 detector and Breath sounds checked- equal and bilateral Difficulty Due To: Difficulty was unanticipated Future Recommendations: Recommend- induction with short-acting agent, and alternative techniques readily available Comments: Visualization of airway was not difficult.  Advancing the tube through the vocal cords was difficult.         Linwood DibblesKnapp, Bianca Raneri, MD 09/23/17 2138

## 2017-09-23 NOTE — Progress Notes (Signed)
Respiratory Care Note: Went in to obtain ABG and noticed that the patient had vomited up tan and brown vomitus secretions. Took the patient off BIPAP for patient's safety. Placed on 12 L high flow [place]. RN aware.

## 2017-09-23 NOTE — ED Notes (Signed)
Etom: 2133 Succ: 2134  2128 Intubation at 25 at the lip and size 7 ET Tube

## 2017-09-23 NOTE — ED Notes (Signed)
Neuro checks ever 2 hours per Dr. Clayborne DanaMesner.

## 2017-09-23 NOTE — Progress Notes (Signed)
Respiratory Care Note: Spoke with RN about my concerns about doing the patient's ABG at this due to the patient having a mask leak of 99. Per MD it's okay to obtain ABG in about 30 minuites to an hour for a more accurate results.

## 2017-09-24 ENCOUNTER — Other Ambulatory Visit: Payer: Self-pay

## 2017-09-24 ENCOUNTER — Inpatient Hospital Stay (HOSPITAL_COMMUNITY): Payer: Medicare Other

## 2017-09-24 ENCOUNTER — Encounter (HOSPITAL_COMMUNITY): Payer: Self-pay

## 2017-09-24 DIAGNOSIS — I2782 Chronic pulmonary embolism: Secondary | ICD-10-CM

## 2017-09-24 DIAGNOSIS — J9601 Acute respiratory failure with hypoxia: Principal | ICD-10-CM

## 2017-09-24 DIAGNOSIS — J9602 Acute respiratory failure with hypercapnia: Secondary | ICD-10-CM

## 2017-09-24 DIAGNOSIS — A09 Infectious gastroenteritis and colitis, unspecified: Secondary | ICD-10-CM

## 2017-09-24 DIAGNOSIS — J9600 Acute respiratory failure, unspecified whether with hypoxia or hypercapnia: Secondary | ICD-10-CM | POA: Diagnosis present

## 2017-09-24 DIAGNOSIS — F200 Paranoid schizophrenia: Secondary | ICD-10-CM

## 2017-09-24 LAB — BASIC METABOLIC PANEL
ANION GAP: 8 (ref 5–15)
BUN: 9 mg/dL (ref 6–20)
CHLORIDE: 88 mmol/L — AB (ref 101–111)
CO2: 26 mmol/L (ref 22–32)
Calcium: 7.1 mg/dL — ABNORMAL LOW (ref 8.9–10.3)
Creatinine, Ser: 0.51 mg/dL (ref 0.44–1.00)
GFR calc Af Amer: >60 mL/min (ref >60)
Glucose, Bld: 71 mg/dL (ref 65–99)
POTASSIUM: 3.9 mmol/L (ref 3.5–5.1)
Sodium: 122 mmol/L — ABNORMAL LOW (ref 135–145)

## 2017-09-24 LAB — BLOOD GAS, ARTERIAL
ACID-BASE DEFICIT: 6.2 mmol/L — AB (ref 0.0–2.0)
BICARBONATE: 29.4 mmol/L — AB (ref 20.0–28.0)
Drawn by: 22223
FIO2: 50
LHR: 18 {breaths}/min
MECHVT: 550 mL
O2 SAT: 96.9 %
PATIENT TEMPERATURE: 37.8
PCO2 ART: 49.4 mmHg — AB (ref 32.0–48.0)
PEEP/CPAP: 5 cmH2O
PH ART: 7.411 (ref 7.350–7.450)
pO2, Arterial: 88.9 mmHg (ref 83.0–108.0)

## 2017-09-24 LAB — CBC
HEMATOCRIT: 36.6 % (ref 36.0–46.0)
HEMOGLOBIN: 11.8 g/dL — AB (ref 12.0–15.0)
MCH: 28.1 pg (ref 26.0–34.0)
MCHC: 32.2 g/dL (ref 30.0–36.0)
MCV: 87.1 fL (ref 78.0–100.0)
Platelets: 265 10*3/uL (ref 150–400)
RBC: 4.2 MIL/uL (ref 3.87–5.11)
RDW: 13.7 % (ref 11.5–15.5)
WBC: 10.9 10*3/uL — AB (ref 4.0–10.5)

## 2017-09-24 LAB — CBG MONITORING, ED: GLUCOSE-CAPILLARY: 74 mg/dL (ref 65–99)

## 2017-09-24 LAB — HIV ANTIBODY (ROUTINE TESTING W REFLEX): HIV Screen 4th Generation wRfx: NONREACTIVE

## 2017-09-24 LAB — OSMOLALITY, URINE: OSMOLALITY UR: 313 mosm/kg (ref 300–900)

## 2017-09-24 MED ORDER — IPRATROPIUM-ALBUTEROL 0.5-2.5 (3) MG/3ML IN SOLN
3.0000 mL | Freq: Four times a day (QID) | RESPIRATORY_TRACT | Status: DC
  Administered 2017-09-24 – 2017-09-27 (×12): 3 mL via RESPIRATORY_TRACT
  Filled 2017-09-24 (×12): qty 3

## 2017-09-24 MED ORDER — IOPAMIDOL (ISOVUE-300) INJECTION 61%
100.0000 mL | Freq: Once | INTRAVENOUS | Status: AC | PRN
  Administered 2017-09-24: 100 mL via INTRAVENOUS

## 2017-09-24 MED ORDER — DEXTROSE-NACL 5-0.9 % IV SOLN
INTRAVENOUS | Status: DC
  Administered 2017-09-24 – 2017-09-28 (×5): via INTRAVENOUS

## 2017-09-24 MED ORDER — PROPOFOL 1000 MG/100ML IV EMUL
0.0000 ug/kg/min | INTRAVENOUS | Status: DC
  Administered 2017-09-24: 24.947 ug/kg/min via INTRAVENOUS
  Administered 2017-09-25 (×2): 35 ug/kg/min via INTRAVENOUS
  Administered 2017-09-25: 40 ug/kg/min via INTRAVENOUS
  Administered 2017-09-25 – 2017-09-26 (×2): 35 ug/kg/min via INTRAVENOUS
  Filled 2017-09-24 (×5): qty 100
  Filled 2017-09-24: qty 200

## 2017-09-24 MED ORDER — SODIUM CHLORIDE 0.9 % IV SOLN
3.0000 g | Freq: Four times a day (QID) | INTRAVENOUS | Status: DC
  Administered 2017-09-24 – 2017-09-29 (×22): 3 g via INTRAVENOUS
  Filled 2017-09-24 (×33): qty 3

## 2017-09-24 MED ORDER — METOPROLOL TARTRATE 25 MG PO TABS: 12.5000 mg | ORAL_TABLET | Freq: Two times a day (BID) | ORAL | Status: DC

## 2017-09-24 MED ORDER — METOPROLOL TARTRATE 5 MG/5ML IV SOLN
2.5000 mg | Freq: Two times a day (BID) | INTRAVENOUS | Status: DC
  Administered 2017-09-24 – 2017-09-27 (×6): 2.5 mg via INTRAVENOUS
  Filled 2017-09-24 (×6): qty 5

## 2017-09-24 MED FILL — Medication: Qty: 1 | Status: AC

## 2017-09-24 NOTE — ED Notes (Signed)
Pt moving around on stretcher and trying to sit up.  Family at bedside talking.  Gave fentanyl bolus.  Advised family to try to limit conversation while in room with pt.

## 2017-09-24 NOTE — ED Notes (Signed)
Pt repositioned to right side.  Pt awakens when spoken to.  Asked pt if she was comfortable.  Pt nodded her head yes.

## 2017-09-24 NOTE — Progress Notes (Signed)
[facility] PROGRESS NOTE  Nancy Erickson WUJ:[id number] DOB: [birth year] DOA: 09/23/2017 PCP: Henrine Screws, MD  Interim summary and HPI 62 y.o. 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  Patient is currently unresponsive on BiPAP and careworkers at bedside state that she has been drinking an excessive amount of water recently.  She has been taking her medications as otherwise prescribed including her Xarelto.  Case further complicated for acute resp failure with hypoxia/hypercapnia and concerns for aspiration PNA. Mechanically ventilated and started on Unasyn.  Assessment/Plan: 1-acute encephalopathy: hypercapnic and metabolic (in the setting of hyponatremia) -continue to follow electrolytes -patient hyponatremic in the setting of psychogenic polydipsia  -patient with episode of vomiting and further resp deterioration, most likely with aspiration -will continue ventilatory support for now -continue current antibiotics -follow clinical response  2-acute colitis: presume to be infectious -will continue unasyn -follow clinical response -continue supportive care  3-paranoid schizophrenia  -continue home meds  4-HTN -fair control  -will monitor for now and if needed use IV hydralazine (currently NPO) -continue lopressor  5-hx of PE -given NPO status will switch to lovenox per pharmacy    Code Status: Full code Family Communication: no family at bedside  Disposition Plan: remains in ICU, continue mechanical ventilatory support; continue current antibiotics; follow pulmonary rec's. Follow electrolytes and replete as needed.   Consultants:  PCC/critical care  Procedures:  See  below for x-ray reports   ETT and Mechanical ventilation   Antibiotics:  unasyn 2/19  HPI/Subjective: Afebrile currently, intubated and mechanically ventilated. Able to follow commands.   Objective: Vitals:   09/24/17 1743 09/24/17 1822  BP: 114/77   Pulse: 88   Resp: 18   Temp:  98 F (36.7 C)  SpO2: 95%     Intake/Output Summary (Last 24 hours) at 09/24/2017 1930 Last data filed at 09/24/2017 1333 Gross per 24 hour  Intake 1700 ml  Output 2750 ml  Net -1050 ml   Filed Weights   09/24/17 0643 09/24/17 1822  Weight: 62.8 kg (138 lb 6.4 oz) 74.3 kg (163 lb 12.8 oz)    Exam:   General:  Currently afebrile, intubated, able to follow commands; appears comfortable.  Cardiovascular: S1 and S2, no rubs, no gallops  Respiratory: positive exp wheezing, no crackles, positive rhonchi, no using accessory muscles.   Abdomen: soft, no guarding, mild distension, positive BS  Musculoskeletal: no edema, no cyanosis   Data Reviewed: Basic Metabolic Panel: Recent Labs  Lab 09/23/17 1040 09/23/17 1104 09/23/17 1426 09/24/17 0540  NA 114* 114* 116* 122*  K 4.1 4.3 4.2 3.9  CL 75* 74* 80* 88*  CO2 30  --  29 26  GLUCOSE 109* 107* 108* 71  BUN 10 9 8 9   CREATININE 0.41* 0.60 0.34* 0.51  CALCIUM 8.0*  --  7.5* 7.1*   Liver Function Tests: Recent Labs  Lab 09/23/17 1040  AST 27  ALT 29  ALKPHOS 84  BILITOT 0.4  PROT 6.5  ALBUMIN 3.4*   CBC: Recent Labs  Lab 09/23/17 1040 09/23/17 1104 09/24/17 0540  WBC 12.6*  --  10.9*  NEUTROABS 10.8*  --   --   HGB 13.0 15.3* 11.8*  HCT 39.1 45.0 36.6  MCV 86.9  --  87.1  PLT 310  --  265   CBG: Recent Labs  Lab 09/24/17 0759  GLUCAP 74   Studies: Ct Angio Chest Pe W And/or Wo Contrast  Result Date: 09/23/2017 CLINICAL DATA:  Dizziness. EXAM: CT ANGIOGRAPHY CHEST WITH CONTRAST TECHNIQUE: Multidetector CT imaging of the chest was performed using the standard protocol during bolus administration of intravenous  contrast. Multiplanar CT image reconstructions and MIPs were obtained to evaluate the vascular anatomy. CONTRAST:  100mL ISOVUE-370 IOPAMIDOL (ISOVUE-370) INJECTION 76% COMPARISON:  CT scan of October 15, 2014. FINDINGS: Cardiovascular: There is no evidence of large central pulmonary embolus. However, due to persistent respiratory motion artifact, smaller peripheral pulmonary emboli cannot be excluded on the basis of this exam. There is no evidence of thoracic aortic dissection or aneurysm. Mediastinum/Nodes: No enlarged mediastinal, hilar, or axillary lymph nodes. Thyroid gland, trachea, and esophagus demonstrate no significant findings. Lungs/Pleura: No pneumothorax is noted. Minimal left pleural effusion is noted. Moderate right pleural effusion is noted with adjacent subsegmental atelectasis. Upper Abdomen: No acute abnormality. Musculoskeletal: No significant osseous abnormality is noted. There is interval development of 2.3 x 1.7 cm mass in the left breast. Review of the MIP images confirms the above findings. IMPRESSION: No evidence of large central pulmonary embolus. However, due to persistent respiratory motion artifact, smaller peripheral pulmonary emboli cannot be excluded on the basis of this exam. Interval development of 2.3 x 1.7 cm mass in the left breast. Further evaluation with mammographic and ultrasound correlation is recommended. Moderate right pleural effusion is noted with adjacent subsegmental atelectasis. Electronically Signed   By: Lupita RaiderJames  Green Jr, M.D.   On: 09/23/2017 13:29   Ct Abdomen Pelvis W Contrast  Result Date: 09/24/2017 CLINICAL DATA:  Abdominal distension.  Nausea and vomiting. EXAM: CT ABDOMEN AND PELVIS WITH CONTRAST TECHNIQUE: Multidetector CT imaging of the abdomen and pelvis was performed using the standard protocol following bolus administration of intravenous contrast. CONTRAST:  100mL ISOVUE-300 IOPAMIDOL (ISOVUE-300) INJECTION 61% COMPARISON:  Chest CT yesterday.  FINDINGS: Lower chest: Bilateral pleural effusions, right greater than left with adjacent atelectasis, similar to chest CT. Small pericardial effusion. Hepatobiliary: The liver is enlarged spanning 21 cm cranial caudal. No focal hepatic lesion. High-density within the gallbladder is likely vicarious excretion of contrast from prior chest CT. Motion through the gallbladder fossa limits assessment for wall thickening. Pancreas: No ductal dilatation or inflammation. Spleen: Normal in size without focal abnormality. Adrenals/Urinary Tract: No adrenal nodule. No hydronephrosis or perinephric edema. Homogeneous renal enhancement with symmetric excretion on delayed phase imaging. Urinary bladder is decompressed by Foley catheter. Stomach/Bowel: Enteric tube in the stomach. No small bowel obstruction, dilatation or wall thickening. Cecum located in the central pelvis just to the left of midline. No cecal wall thickening. There is wall thickening of the ascending colon from the cecum to the splenic flexure with associated pericolonic edema. Transverse and sigmoid colonic tortuosity. Normal appendix. Vascular/Lymphatic: Aortic atherosclerosis without aneurysm. Portal and mesenteric veins are patent. No adenopathy allowing for mild motion artifact limitations. Reproductive: Uterus and bilateral adnexa are unremarkable. Other: Small volume abdominopelvic ascites. No free air. No intra-abdominal abscess. Small fat containing left inguinal hernia. Mild body wall edema. Musculoskeletal: Degenerative anterolisthesis of L4 on L5 secondary to facet disease and degenerative disc disease. Fracture of right lateral tenth rib appears acute. IMPRESSION: 1. Ascending colonic wall thickening consistent with colitis, may be infectious or inflammatory. 2. Small volume abdominopelvic ascites. 3. High density in the gallbladder likely vicarious excretion of contrast from prior chest CT. 4. Right posterior tenth  rib fracture appears acute. 5.   Aortic Atherosclerosis (ICD10-I70.0). Electronically Signed   By: Rubye Oaks M.D.   On: 09/24/2017 02:07   Dg Chest Port 1 View  Result Date: 09/23/2017 CLINICAL DATA:  Altered mental status and slurred speech since 745 this morning. Dizziness last night. Elevated white blood cell count. EXAM: PORTABLE CHEST 1 VIEW COMPARISON:  PA and lateral chest 05/09/2017.  CT chest 10/15/2014. FINDINGS: Extensive airspace disease is present throughout the right chest. Bilateral pleural effusions are present, larger on the right. There is cardiomegaly and vascular congestion. No pneumothorax. No acute bony abnormality. IMPRESSION: Right pleural effusion and extensive airspace disease most worrisome for pneumonia. Small left pleural effusion and basilar atelectasis. Cardiomegaly and pulmonary vascular congestion. Electronically Signed   By: Drusilla Kanner M.D.   On: 09/23/2017 11:29   Dg Chest Port 1v Same Day  Result Date: 09/23/2017 CLINICAL DATA:  Intubation EXAM: PORTABLE CHEST 1 VIEW COMPARISON:  CT 09/23/2017, radiograph 09/23/2017, 05/09/2017 FINDINGS: Placement of endotracheal tube with tip about 3.4 cm superior to the carina. Esophageal tube has also been placed and the tip projects over the gastroduodenal region. Small right greater than left pleural effusions stable borderline cardiomegaly. Vascular congestion with mild pulmonary edema. Atelectasis or pneumonia at the right base. IMPRESSION: 1. Endotracheal tube tip about 3.4 cm superior to carina. Esophageal tube tip projects over gastroduodenal region 2. Similar appearance of vascular congestion and mild pulmonary edema 3. Small right greater than left pleural effusions with right basilar atelectasis or pneumonia Electronically Signed   By: Jasmine Pang M.D.   On: 09/23/2017 22:20   Ct Head Code Stroke Wo Contrast  Result Date: 09/23/2017 CLINICAL DATA:  Code stroke. Dizziness and balance disturbance beginning last night at 2030 hours. Some  slurred speech. Clinical notes state this is not a code stroke exam. EXAM: CT HEAD WITHOUT CONTRAST TECHNIQUE: Contiguous axial images were obtained from the base of the skull through the vertex without intravenous contrast. COMPARISON:  10/14/2014 FINDINGS: Brain: Some motion degradation. Allowing for that, normal appearance of the brain without evidence of atrophy, old or acute infarction, mass lesion, hemorrhage, hydrocephalus or extra-axial collection. Vascular: No abnormal vascular finding. Skull: Normal Sinuses/Orbits: Clear/normal Other: None IMPRESSION: 1. Motion degraded study, but normal in appearance allowing for that. Electronically Signed   By: Paulina Fusi M.D.   On: 09/23/2017 13:31    Scheduled Meds: . benztropine  0.5 mg Oral Daily  . enoxaparin (LOVENOX) injection  65 mg Subcutaneous Q12H  . fentaNYL (SUBLIMAZE) injection  50 mcg Intravenous Once  . furosemide  40 mg Intravenous Once  . hydrocerin   Topical Daily  . ipratropium-albuterol  3 mL Nebulization Q6H  . metoprolol tartrate  12.5 mg Oral BID  . risperiDONE  3 mg Oral BID  . sodium chloride flush  3 mL Intravenous Q12H  . triamcinolone cream  1 application Topical BID   Continuous Infusions: . sodium chloride    . ampicillin-sulbactam (UNASYN) IV Stopped (09/24/17 1824)  . dextrose 5 % and 0.9% NaCl 50 mL/hr at 09/24/17 1115  . fentaNYL infusion INTRAVENOUS 100 mcg/hr (09/24/17 1825)  . propofol (DIPRIVAN) infusion 35.032 mcg/kg/min (09/24/17 1833)    Principal Problem:   Acute metabolic encephalopathy Active Problems:   HTN (hypertension)   Hyponatremia   Pulmonary embolus (HCC)   Paranoid schizophrenia (HCC)   Cardiomyopathy (HCC)   Pulmonary hypertension (HCC)   Acute respiratory failure (HCC)   Time spent: 40 minutes (> 50% dedicated  to face to face examination and coordination of care).   Vassie Loll  [facility] Pager [phone]. If 7PM-7AM, please contact night-coverage at [email or web address],  password [facility] 09/24/2017, 7:30 PM  LOS: 1 day

## 2017-09-24 NOTE — ED Notes (Signed)
Pt resting with eyes shut.   Lungs clear when auscultated.

## 2017-09-24 NOTE — ED Notes (Signed)
Pt has eyes open.  Keeps reaching up for ET Tube and shaking head.   Mittens applied to hands.  Titrated diprivan up with no response

## 2017-09-24 NOTE — ED Notes (Signed)
Pt awake and looking around.  Family at bedside.  Increased proprofol.

## 2017-09-24 NOTE — ED Notes (Signed)
Dr Madera at bedside  

## 2017-09-24 NOTE — ED Notes (Signed)
Oral care provided.  Pt turned to left side.  Pts  eyes open.   Bruising noted to left side of chest.  Right upper back and right hip area.

## 2017-09-24 NOTE — ED Notes (Signed)
Messaged hospitalist regarding CBC

## 2017-09-25 ENCOUNTER — Inpatient Hospital Stay (HOSPITAL_COMMUNITY): Payer: Medicare Other

## 2017-09-25 ENCOUNTER — Encounter (HOSPITAL_COMMUNITY): Payer: Self-pay | Admitting: *Deleted

## 2017-09-25 LAB — CBC
HEMATOCRIT: 37.4 % (ref 36.0–46.0)
HEMOGLOBIN: 11.9 g/dL — AB (ref 12.0–15.0)
MCH: 27.7 pg (ref 26.0–34.0)
MCHC: 31.8 g/dL (ref 30.0–36.0)
MCV: 87 fL (ref 78.0–100.0)
PLATELETS: 336 10*3/uL (ref 150–400)
RBC: 4.3 MIL/uL (ref 3.87–5.11)
RDW: 14.7 % (ref 11.5–15.5)
WBC: 7.5 10*3/uL (ref 4.0–10.5)

## 2017-09-25 LAB — GLUCOSE, CAPILLARY
GLUCOSE-CAPILLARY: 100 mg/dL — AB (ref 65–99)
Glucose-Capillary: 86 mg/dL (ref 65–99)
Glucose-Capillary: 84 mg/dL (ref 65–99)
Glucose-Capillary: 62 mg/dL — ABNORMAL LOW (ref 65–99)
Glucose-Capillary: 95 mg/dL (ref 65–99)

## 2017-09-25 LAB — BASIC METABOLIC PANEL
ANION GAP: 11 (ref 5–15)
BUN: 9 mg/dL (ref 6–20)
CHLORIDE: 95 mmol/L — AB (ref 101–111)
CO2: 27 mmol/L (ref 22–32)
Calcium: 7.6 mg/dL — ABNORMAL LOW (ref 8.9–10.3)
Creatinine, Ser: 0.59 mg/dL (ref 0.44–1.00)
GFR calc Af Amer: >60 mL/min (ref >60)
Glucose, Bld: 88 mg/dL (ref 65–99)
POTASSIUM: 3.4 mmol/L — AB (ref 3.5–5.1)
Sodium: 133 mmol/L — ABNORMAL LOW (ref 135–145)

## 2017-09-25 LAB — MRSA PCR SCREENING: MRSA by PCR: NEGATIVE

## 2017-09-25 LAB — TRIGLYCERIDES: Triglycerides: 67 mg/dL (ref <150)

## 2017-09-25 MED ORDER — POTASSIUM CHLORIDE 10 MEQ/100ML IV SOLN
10.0000 meq | INTRAVENOUS | Status: AC
  Administered 2017-09-25 (×2): 10 meq via INTRAVENOUS
  Filled 2017-09-25 (×2): qty 100

## 2017-09-25 MED ORDER — PANTOPRAZOLE SODIUM 40 MG IV SOLR
40.0000 mg | Freq: Every day | INTRAVENOUS | Status: DC
  Administered 2017-09-25 – 2017-09-27 (×3): 40 mg via INTRAVENOUS
  Filled 2017-09-25 (×3): qty 40

## 2017-09-25 MED ORDER — ORAL CARE MOUTH RINSE
15.0000 mL | Freq: Four times a day (QID) | OROMUCOSAL | Status: DC
  Administered 2017-09-25 – 2017-09-29 (×16): 15 mL via OROMUCOSAL

## 2017-09-25 MED ORDER — CHLORHEXIDINE GLUCONATE 0.12% ORAL RINSE (MEDLINE KIT)
15.0000 mL | Freq: Two times a day (BID) | OROMUCOSAL | Status: DC
  Administered 2017-09-25 – 2017-09-30 (×8): 15 mL via OROMUCOSAL

## 2017-09-25 MED ORDER — DEXTROSE 50 % IV SOLN
INTRAVENOUS | Status: AC
  Administered 2017-09-25: 50 mL
  Filled 2017-09-25: qty 50

## 2017-09-25 MED ORDER — FENTANYL CITRATE (PF) 100 MCG/2ML IJ SOLN: 100.0000 ug | INTRAMUSCULAR | Status: DC | PRN

## 2017-09-25 MED ORDER — FENTANYL CITRATE (PF) 2500 MCG/50ML IJ SOLN
INTRAMUSCULAR | Status: AC
  Filled 2017-09-25: qty 50

## 2017-09-25 NOTE — Consult Note (Signed)
Consult requested by: [facility] hospitalist Dr. Dyann Kief Consult requested for: Respiratory failure  HPI: This is a 62 year old who has significant medical history of previous pulmonary embolus on Xarelto, hypertension, cardiomyopathy, pulmonary hypertension, paranoid schizophrenia and probable COPD.  She has a history of psychogenic polydipsia and report from the medical record says that she had been drinking an excessive amount of water.  Her sister Mickel Baas told me by phone that she is not sure that is correct.  At any rate she came to the emergency department with very low sodium level slurred speech weakness dizziness.  She was intubated and placed on mechanical ventilation.  She has a extensive smoking history and probably has COPD but she is not taking any inhalers.  She still smokes 5 cigarettes a day.  She has had vomiting so the concern is of course that she has had aspiration pneumonia.  CT of the chest did not show pulmonary emboli but did show a right pleural effusion and a right infiltrate.  I personally reviewed that study.  CT of the abdomen and pelvis showed colitis.  Portable chest x-ray this morning shows what looks like aspiration pneumonia  Past Medical History:  Diagnosis Date  . Hypertension   . Hyponatremia   . Paranoid schizophrenia ([place])   . Pulmonary embolus ([place])   . Venous insufficiency of right lower extremity      Family History  Problem Relation Age of Onset  . Atrial fibrillation Father      Social History   Socioeconomic History  . Marital status: Single    Spouse name: None  . Number of children: None  . Years of education: None  . Highest education level: None  Social Needs  . Financial resource strain: None  . Food insecurity - worry: None  . Food insecurity - inability: None  . Transportation needs - medical: None  . Transportation needs - non-medical: None  Occupational History  . None  Tobacco Use  . Smoking status: Current Every Day Smoker     Packs/day: 1.00    Types: Cigarettes  . Smokeless tobacco: Never Used  Substance and Sexual Activity  . Alcohol use: No  . Drug use: No  . Sexual activity: None  Other Topics Concern  . None  Social History Narrative  . None     ROS: Not obtainable secondary to intubation and mechanical ventilation    Objective: Vital signs in last 24 hours: Temp:  [97.8 F (36.6 C)-100.4 F (38 C)] 97.8 F (36.6 C) (02/21 0742) Pulse Rate:  [61-99] 67 (02/21 0815) Resp:  [6-21] 18 (02/21 0815) BP: (93-120)/(57-85) 113/77 (02/21 0815) SpO2:  [87 %-100 %] 97 % (02/21 0815) FiO2 (%):  [40 %] 40 % (02/21 0802) Weight:  [73.9 kg (162 lb 14.7 oz)-74.3 kg (163 lb 12.8 oz)] 73.9 kg (162 lb 14.7 oz) (02/21 0500) Weight change: 11.5 kg (25 lb 6.4 oz)    Intake/Output from previous day: 02/20 0701 - 02/21 0700 In: 500  Out: 2350 [Urine:2350]  PHYSICAL EXAM Constitutional: She is intubated and sedated.  She is however opening her eyes.  Eyes: Pupils do react although somewhat sluggishly.  EOMI.  Ears nose mouth and throat: Her mucous membranes are moist her throat is clear with the exception of tubing.  Cardiovascular: Her heart is regular with normal heart sounds.  Respiratory: Her respiratory effort is normal she is on the ventilator and she has some rales in the right base.  Gastrointestinal: Her abdomen is soft  with no masses.  Skin: Warm and dry.  Musculoskeletal: She is moving all 4 extremities..  Neurological she does not seem to have any focal abnormalities but she is still sedated.  Psychiatric: Unable to assess  Lab Results: Basic Metabolic Panel: Recent Labs    09/24/17 0540 09/25/17 0523  NA 122* 133*  K 3.9 3.4*  CL 88* 95*  CO2 26 27  GLUCOSE 71 88  BUN 9 9  CREATININE 0.51 0.59  CALCIUM 7.1* 7.6*   Liver Function Tests: Recent Labs    09/23/17 1040  AST 27  ALT 29  ALKPHOS 84  BILITOT 0.4  PROT 6.5  ALBUMIN 3.4*   No results for input(s): LIPASE, AMYLASE in the  last 72 hours. No results for input(s): AMMONIA in the last 72 hours. CBC: Recent Labs    09/23/17 1040  09/24/17 0540 09/25/17 0523  WBC 12.6*  --  10.9* 7.5  NEUTROABS 10.8*  --   --   --   HGB 13.0   < > 11.8* 11.9*  HCT 39.1   < > 36.6 37.4  MCV 86.9  --  87.1 87.0  PLT 310  --  265 336   < > = values in this interval not displayed.   Cardiac Enzymes: No results for input(s): CKTOTAL, CKMB, CKMBINDEX, TROPONINI in the last 72 hours. BNP: No results for input(s): PROBNP in the last 72 hours. D-Dimer: Recent Labs    09/23/17 1040  DDIMER 1.82*   CBG: Recent Labs    09/24/17 0759 09/25/17 0803  GLUCAP 74 86   Hemoglobin A1C: No results for input(s): HGBA1C in the last 72 hours. Fasting Lipid Panel: Recent Labs    09/25/17 0523  TRIG 67   Thyroid Function Tests: Recent Labs    09/23/17 1500  TSH 1.630   Anemia Panel: No results for input(s): VITAMINB12, FOLATE, FERRITIN, TIBC, IRON, RETICCTPCT in the last 72 hours. Coagulation: Recent Labs    09/23/17 1040  LABPROT 14.2  INR 1.11   Urine Drug Screen: Drugs of Abuse     Component Value Date/Time   LABOPIA NONE DETECTED 09/23/2017 2220   COCAINSCRNUR NONE DETECTED 09/23/2017 2220   LABBENZ NONE DETECTED 09/23/2017 2220   AMPHETMU NONE DETECTED 09/23/2017 2220   THCU NONE DETECTED 09/23/2017 2220   LABBARB NONE DETECTED 09/23/2017 2220    Alcohol Level: Recent Labs    09/23/17 1040  ETH <10   Urinalysis: Recent Labs    09/23/17 2220  COLORURINE YELLOW  LABSPEC 1.025  PHURINE 6.0  GLUCOSEU NEGATIVE  HGBUR NEGATIVE  BILIRUBINUR NEGATIVE  KETONESUR NEGATIVE  PROTEINUR 100*  NITRITE NEGATIVE  LEUKOCYTESUR NEGATIVE   Misc. Labs   ABGS: Recent Labs    09/23/17 1104  09/24/17 0500  PHART  --    < > 7.411  PO2ART  --    < > 88.9  TCO2 32  --   --   HCO3  --    < > 29.4*   < > = values in this interval not displayed.     MICROBIOLOGY: Recent Results (from the past 240  hour(s))  MRSA PCR Screening     Status: None   Collection Time: 09/24/17  6:28 PM  Result Value Ref Range Status   MRSA by PCR NEGATIVE NEGATIVE Final    Comment:        The GeneXpert MRSA Assay (FDA approved for NASAL specimens only), is one component of a comprehensive MRSA colonization surveillance  program. It is not intended to diagnose MRSA infection nor to guide or monitor treatment for MRSA infections. Performed at [facility], [address], [place], [place] [address]     Studies/Results: Ct Angio Chest Pe W And/or Wo Contrast  Result Date: 09/23/2017 CLINICAL DATA:  Dizziness. EXAM: CT ANGIOGRAPHY CHEST WITH CONTRAST TECHNIQUE: Multidetector CT imaging of the chest was performed using the standard protocol during bolus administration of intravenous contrast. Multiplanar CT image reconstructions and MIPs were obtained to evaluate the vascular anatomy. CONTRAST:  163m ISOVUE-370 IOPAMIDOL (ISOVUE-370) INJECTION 76% COMPARISON:  CT scan of October 15, 2014. FINDINGS: Cardiovascular: There is no evidence of large central pulmonary embolus. However, due to persistent respiratory motion artifact, smaller peripheral pulmonary emboli cannot be excluded on the basis of this exam. There is no evidence of thoracic aortic dissection or aneurysm. Mediastinum/Nodes: No enlarged mediastinal, hilar, or axillary lymph nodes. Thyroid gland, trachea, and esophagus demonstrate no significant findings. Lungs/Pleura: No pneumothorax is noted. Minimal left pleural effusion is noted. Moderate right pleural effusion is noted with adjacent subsegmental atelectasis. Upper Abdomen: No acute abnormality. Musculoskeletal: No significant osseous abnormality is noted. There is interval development of 2.3 x 1.7 cm mass in the left breast. Review of the MIP images confirms the above findings. IMPRESSION: No evidence of large central pulmonary embolus. However, due to persistent respiratory motion artifact, smaller  peripheral pulmonary emboli cannot be excluded on the basis of this exam. Interval development of 2.3 x 1.7 cm mass in the left breast. Further evaluation with mammographic and ultrasound correlation is recommended. Moderate right pleural effusion is noted with adjacent subsegmental atelectasis. Electronically Signed   By: JMarijo Conception M.D.   On: 09/23/2017 13:29   Ct Abdomen Pelvis W Contrast  Result Date: 09/24/2017 CLINICAL DATA:  Abdominal distension.  Nausea and vomiting. EXAM: CT ABDOMEN AND PELVIS WITH CONTRAST TECHNIQUE: Multidetector CT imaging of the abdomen and pelvis was performed using the standard protocol following bolus administration of intravenous contrast. CONTRAST:  1044mISOVUE-300 IOPAMIDOL (ISOVUE-300) INJECTION 61% COMPARISON:  Chest CT yesterday. FINDINGS: Lower chest: Bilateral pleural effusions, right greater than left with adjacent atelectasis, similar to chest CT. Small pericardial effusion. Hepatobiliary: The liver is enlarged spanning 21 cm cranial caudal. No focal hepatic lesion. High-density within the gallbladder is likely vicarious excretion of contrast from prior chest CT. Motion through the gallbladder fossa limits assessment for wall thickening. Pancreas: No ductal dilatation or inflammation. Spleen: Normal in size without focal abnormality. Adrenals/Urinary Tract: No adrenal nodule. No hydronephrosis or perinephric edema. Homogeneous renal enhancement with symmetric excretion on delayed phase imaging. Urinary bladder is decompressed by Foley catheter. Stomach/Bowel: Enteric tube in the stomach. No small bowel obstruction, dilatation or wall thickening. Cecum located in the central pelvis just to the left of midline. No cecal wall thickening. There is wall thickening of the ascending colon from the cecum to the splenic flexure with associated pericolonic edema. Transverse and sigmoid colonic tortuosity. Normal appendix. Vascular/Lymphatic: Aortic atherosclerosis without  aneurysm. Portal and mesenteric veins are patent. No adenopathy allowing for mild motion artifact limitations. Reproductive: Uterus and bilateral adnexa are unremarkable. Other: Small volume abdominopelvic ascites. No free air. No intra-abdominal abscess. Small fat containing left inguinal hernia. Mild body wall edema. Musculoskeletal: Degenerative anterolisthesis of L4 on L5 secondary to facet disease and degenerative disc disease. Fracture of right lateral tenth rib appears acute. IMPRESSION: 1. Ascending colonic wall thickening consistent with colitis, may be infectious or inflammatory. 2. Small volume abdominopelvic ascites. 3.  High density in the gallbladder likely vicarious excretion of contrast from prior chest CT. 4. Right posterior tenth rib fracture appears acute. 5.  Aortic Atherosclerosis (ICD10-I70.0). Electronically Signed   By: Jeb Levering M.D.   On: 09/24/2017 02:07   Dg Chest Port 1 View  Result Date: 09/25/2017 CLINICAL DATA:  Hypoxia. EXAM: PORTABLE CHEST 1 VIEW COMPARISON:  09/23/2017. FINDINGS: Endotracheal tube and NG tube in stable position. Stable mild cardiomegaly. Progressive bilateral interstitial prominence and small right pleural effusions suggesting CHF. Consolidation in the right lower lung. Pneumonia may also be present. No pneumothorax. IMPRESSION: 1.  Lines and tubes in stable position. 2. Stable cardiomegaly. Progressive bilateral interstitial prominence of small right pleural effusions suggesting CHF. Consolidation in the right lung base noted. Pneumonia may also be present. Electronically Signed   By: Marcello Moores  Register   On: 09/25/2017 07:24   Dg Chest Port 1 View  Result Date: 09/23/2017 CLINICAL DATA:  Altered mental status and slurred speech since 745 this morning. Dizziness last night. Elevated white blood cell count. EXAM: PORTABLE CHEST 1 VIEW COMPARISON:  PA and lateral chest 05/09/2017.  CT chest 10/15/2014. FINDINGS: Extensive airspace disease is present  throughout the right chest. Bilateral pleural effusions are present, larger on the right. There is cardiomegaly and vascular congestion. No pneumothorax. No acute bony abnormality. IMPRESSION: Right pleural effusion and extensive airspace disease most worrisome for pneumonia. Small left pleural effusion and basilar atelectasis. Cardiomegaly and pulmonary vascular congestion. Electronically Signed   By: Inge Rise M.D.   On: 09/23/2017 11:29   Dg Chest Port 1v Same Day  Result Date: 09/23/2017 CLINICAL DATA:  Intubation EXAM: PORTABLE CHEST 1 VIEW COMPARISON:  CT 09/23/2017, radiograph 09/23/2017, 05/09/2017 FINDINGS: Placement of endotracheal tube with tip about 3.4 cm superior to the carina. Esophageal tube has also been placed and the tip projects over the gastroduodenal region. Small right greater than left pleural effusions stable borderline cardiomegaly. Vascular congestion with mild pulmonary edema. Atelectasis or pneumonia at the right base. IMPRESSION: 1. Endotracheal tube tip about 3.4 cm superior to carina. Esophageal tube tip projects over gastroduodenal region 2. Similar appearance of vascular congestion and mild pulmonary edema 3. Small right greater than left pleural effusions with right basilar atelectasis or pneumonia Electronically Signed   By: Donavan Foil M.D.   On: 09/23/2017 22:20   Ct Head Code Stroke Wo Contrast  Result Date: 09/23/2017 CLINICAL DATA:  Code stroke. Dizziness and balance disturbance beginning last night at 2030 hours. Some slurred speech. Clinical notes state this is not a code stroke exam. EXAM: CT HEAD WITHOUT CONTRAST TECHNIQUE: Contiguous axial images were obtained from the base of the skull through the vertex without intravenous contrast. COMPARISON:  10/14/2014 FINDINGS: Brain: Some motion degradation. Allowing for that, normal appearance of the brain without evidence of atrophy, old or acute infarction, mass lesion, hemorrhage, hydrocephalus or extra-axial  collection. Vascular: No abnormal vascular finding. Skull: Normal Sinuses/Orbits: Clear/normal Other: None IMPRESSION: 1. Motion degraded study, but normal in appearance allowing for that. Electronically Signed   By: Nelson Chimes M.D.   On: 09/23/2017 13:31    Medications:  Prior to Admission:  Medications Prior to Admission  Medication Sig Dispense Refill Last Dose  . amLODipine (NORVASC) 10 MG tablet Take 10 mg by mouth daily.   09/23/2017 at Unknown time  . benztropine (COGENTIN) 1 MG tablet Take 0.5 tablets (0.5 mg total) by mouth daily. 30 tablet 0 09/23/2017 at Unknown time  . loratadine (CLARITIN) 10  MG tablet Take 10 mg by mouth daily.   09/23/2017 at Unknown time  . LORazepam (ATIVAN) 1 MG tablet Take 1 tablet (1 mg total) by mouth every 4 (four) hours as needed for anxiety or sleep. (Patient taking differently: Take 1 mg by mouth 3 (three) times daily as needed for anxiety. ) 30 tablet 0 Past Week at Unknown time  . metoprolol tartrate (LOPRESSOR) 25 MG tablet Take 25 mg by mouth 2 (two) times daily.   09/23/2017 at 0730  . risperiDONE (RISPERDAL) 3 MG tablet Take 3 mg by mouth 2 (two) times daily.   09/23/2017 at Unknown time  . rivaroxaban (XARELTO) 20 MG TABS tablet Take 1 tablet (20 mg total) by mouth daily with supper. 30 tablet 0 09/22/2017 at Unknown time  . Skin Protectants, Misc. (EUCERIN) cream Apply topically daily. Tac/euc 0.1% 1:4 Apply sparingly to rash on lower legs   Past Week at Unknown time  . triamcinolone cream (KENALOG) 0.1 % Apply 1 application topically 2 (two) times daily.    Past Week at Unknown time   Scheduled: . benztropine  0.5 mg Oral Daily  . chlorhexidine gluconate (MEDLINE KIT)  15 mL Mouth Rinse BID  . enoxaparin (LOVENOX) injection  65 mg Subcutaneous Q12H  . fentaNYL (SUBLIMAZE) injection  50 mcg Intravenous Once  . furosemide  40 mg Intravenous Once  . hydrocerin   Topical Daily  . ipratropium-albuterol  3 mL Nebulization Q6H  . mouth rinse  15 mL  Mouth Rinse QID  . metoprolol tartrate  2.5 mg Intravenous Q12H  . pantoprazole (PROTONIX) IV  40 mg Intravenous Daily  . risperiDONE  3 mg Oral BID  . sodium chloride flush  3 mL Intravenous Q12H  . triamcinolone cream  1 application Topical BID   Continuous: . sodium chloride    . ampicillin-sulbactam (UNASYN) IV Stopped (09/25/17 4098)  . dextrose 5 % and 0.9% NaCl 50 mL/hr at 09/25/17 0816  . fentaNYL infusion INTRAVENOUS 100 mcg/hr (09/25/17 0300)  . propofol (DIPRIVAN) infusion 35 mcg/kg/min (09/25/17 0920)   JXB:JYNWGN chloride, acetaminophen **OR** acetaminophen, fentaNYL, fentaNYL (SUBLIMAZE) injection, fentaNYL (SUBLIMAZE) injection, hydrALAZINE, ondansetron **OR** ondansetron (ZOFRAN) IV, sodium chloride flush  Assesment: She was admitted with acute metabolic encephalopathy most likely related to hyponatremia but she also has paranoid schizophrenia.  She developed acute respiratory failure which culminated in her being intubated and placed on mechanical ventilation.  She had nausea and vomiting and I believe she has aspirated and she is being appropriately treated for that.  I think she has COPD at baseline although she is not on any treatment at her assisted living facility  She has history of pulmonary embolus but it did not show on her current CT  She has colitis which is being treated  She has pulmonary hypertension at baseline. Principal Problem:   Acute metabolic encephalopathy Active Problems:   HTN (hypertension)   Hyponatremia   Pulmonary embolus ([facility])   Paranoid schizophrenia ([place])   Cardiomyopathy ([place])   Pulmonary hypertension ([place])   Acute respiratory failure ([place])    Plan: Continue treatments.  Probably not ready to wean but will try    LOS: 2 days   Augusto Deckman L 09/25/2017, 9:48 AM

## 2017-09-25 NOTE — Progress Notes (Signed)
[facility] PROGRESS NOTE  Nancy Erickson OZH:[id number] DOB: 20-Jul-1956 DOA: 09/23/2017 PCP: Aura Dials, MD  Interim summary and HPI 62 y.o. 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  Patient is currently unresponsive on BiPAP and careworkers at bedside state that she has been drinking an excessive amount of water recently.  She has been taking her medications as otherwise prescribed including her Xarelto.  Case further complicated for acute resp failure with hypoxia/hypercapnia and concerns for aspiration PNA. Mechanically ventilated and started on Unasyn.  Assessment/Plan: 1-acute encephalopathy: hypercapnic and metabolic (in the setting of hyponatremia) -continue to follow electrolytes -patient hyponatremic in the setting of psychogenic polydipsia; sodium 133 now. -patient's resp failure most likely triggered by aspiration. -continue unasyn -will continue ventilatory support and follow pulmonary rec's for weaning and extubation.  2-acute colitis: presume to be infectious -no fever -no having abd pain currently -will continue unasyn  -follow clinical response and continue  3-paranoid schizophrenia  -continue home meds -following commands appropriately on assessment  4-HTN -good control overall  -continue PRN hydralazine -continue lopressor   5-hx of PE -given NPO status will continue lovenox per pharmacy  -no PE seen on CTA during this admission   6-hypokalemia -will replete as needed -check Mg level   Code Status: Full code Family Communication: no family at bedside  Disposition Plan: remains in ICU, continue mechanical ventilatory support; continue current  antibiotics; follow pulmonary rec's. Follow electrolytes and further replete as needed.   Consultants:  PCC/critical care  Procedures:  See below for x-ray reports   ETT and Mechanical ventilation   Antibiotics:  unasyn 2/19  HPI/Subjective: No major distress, no fever, appears comfortable while mechanically ventilated. No CP or Abd pain.  Objective: Vitals:   09/25/17 2000 09/25/17 2012  BP:    Pulse:    Resp:    Temp: (!) 97.5 F (36.4 C)   SpO2:  96%    Intake/Output Summary (Last 24 hours) at 09/25/2017 2038 Last data filed at 09/25/2017 1811 Gross per 24 hour  Intake 2233.37 ml  Output 1700 ml  Net 533.37 ml   Filed Weights   09/24/17 0643 09/24/17 1822 09/25/17 0500  Weight: 62.8 kg (138 lb 6.4 oz) 74.3 kg (163 lb 12.8 oz) 73.9 kg (162 lb 14.7 oz)    Exam:   General: no fever, intubated and mechanical ventilated. Comfortable and able to follow commands.no chest or abd pain reported.  Cardiovascular: S1 and S2, no rubs, no gallops  Respiratory: positive exp wheezing and rhonchi, no crackles appreciated, good resp effort.  Abdomen: soft, positive BS, no guarding, reports no pain in her belly.   Musculoskeletal: no edema, no cyanosis, no clubbing.  Data Reviewed: Basic Metabolic Panel: Recent Labs  Lab 09/23/17 1040 09/23/17 1104 09/23/17 1426 09/24/17 0540 09/25/17 0523  NA 114* 114* 116* 122* 133*  K 4.1 4.3 4.2 3.9 3.4*  CL 75* 74* 80* 88* 95*  CO2 30  --  29 26 27   GLUCOSE 109* 107* 108* 71 88  BUN 10 9 8 9 9   CREATININE 0.41* 0.60 0.34* 0.51 0.59  CALCIUM 8.0*  --  7.5* 7.1* 7.6*   Liver Function Tests: Recent Labs  Lab 09/23/17 1040  AST 27  ALT 29  ALKPHOS 84  BILITOT 0.4  PROT 6.5  ALBUMIN 3.4*   CBC: Recent Labs  Lab 09/23/17 1040 09/23/17 1104 09/24/17 0540 09/25/17 0523  WBC 12.6*  --  10.9* 7.5  NEUTROABS 10.8*  --   --   --   HGB 13.0 15.3* 11.8* 11.9*  HCT 39.1 45.0 36.6 37.4  MCV 86.9  --  87.1 87.0  PLT  310  --  265 336   CBG: Recent Labs  Lab 09/25/17 0803 09/25/17 1125 09/25/17 1627 09/25/17 1821 09/25/17 1947  GLUCAP 86 84 62* 100* 95   Studies: Ct Abdomen Pelvis W Contrast  Result Date: 09/24/2017 CLINICAL DATA:  Abdominal distension.  Nausea and vomiting. EXAM: CT ABDOMEN AND PELVIS WITH CONTRAST TECHNIQUE: Multidetector CT imaging of the abdomen and pelvis was performed using the standard protocol following bolus administration of intravenous contrast. CONTRAST:  164m ISOVUE-300 IOPAMIDOL (ISOVUE-300) INJECTION 61% COMPARISON:  Chest CT yesterday. FINDINGS: Lower chest: Bilateral pleural effusions, right greater than left with adjacent atelectasis, similar to chest CT. Small pericardial effusion. Hepatobiliary: The liver is enlarged spanning 21 cm cranial caudal. No focal hepatic lesion. High-density within the gallbladder is likely vicarious excretion of contrast from prior chest CT. Motion through the gallbladder fossa limits assessment for wall thickening. Pancreas: No ductal dilatation or inflammation. Spleen: Normal in size without focal abnormality. Adrenals/Urinary Tract: No adrenal nodule. No hydronephrosis or perinephric edema. Homogeneous renal enhancement with symmetric excretion on delayed phase imaging. Urinary bladder is decompressed by Foley catheter. Stomach/Bowel: Enteric tube in the stomach. No small bowel obstruction, dilatation or wall thickening. Cecum located in the central pelvis just to the left of midline. No cecal wall thickening. There is wall thickening of the ascending colon from the cecum to the splenic flexure with associated pericolonic edema. Transverse and sigmoid colonic tortuosity. Normal appendix. Vascular/Lymphatic: Aortic atherosclerosis without aneurysm. Portal and mesenteric veins are patent. No adenopathy allowing for mild motion artifact limitations. Reproductive: Uterus and bilateral adnexa are unremarkable. Other: Small volume abdominopelvic  ascites. No free air. No intra-abdominal abscess. Small fat containing left inguinal hernia. Mild body wall edema. Musculoskeletal: Degenerative anterolisthesis of L4 on L5 secondary to facet disease and degenerative disc disease. Fracture of right lateral tenth rib appears acute. IMPRESSION: 1. Ascending colonic wall thickening consistent with colitis, may be infectious or inflammatory. 2. Small volume abdominopelvic ascites. 3. High density in the gallbladder likely vicarious excretion of contrast from prior chest CT. 4. Right posterior tenth rib fracture appears acute. 5.  Aortic Atherosclerosis (ICD10-I70.0). Electronically Signed   By: MJeb LeveringM.D.   On: 09/24/2017 02:07   Dg Chest Port 1 View  Result Date: 09/25/2017 CLINICAL DATA:  Hypoxia. EXAM: PORTABLE CHEST 1 VIEW COMPARISON:  09/23/2017. FINDINGS: Endotracheal tube and NG tube in stable position. Stable mild cardiomegaly. Progressive bilateral interstitial prominence and small right pleural effusions suggesting CHF. Consolidation in the right lower lung. Pneumonia may also be present. No pneumothorax. IMPRESSION: 1.  Lines and tubes in stable position. 2. Stable cardiomegaly. Progressive bilateral interstitial prominence of small right pleural effusions suggesting CHF. Consolidation in the right lung base noted. Pneumonia may also be present. Electronically Signed   By: TMarcello Moores Register   On: 09/25/2017 07:24   Dg Chest Port 1v Same Day  Result Date: 09/23/2017 CLINICAL DATA:  Intubation EXAM: PORTABLE CHEST 1 VIEW COMPARISON:  CT 09/23/2017, radiograph 09/23/2017, 05/09/2017 FINDINGS: Placement of endotracheal tube with tip about 3.4 cm superior to the carina. Esophageal tube has also been placed and  the tip projects over the gastroduodenal region. Small right greater than left pleural effusions stable borderline cardiomegaly. Vascular congestion with mild pulmonary edema. Atelectasis or pneumonia at the right base. IMPRESSION: 1.  Endotracheal tube tip about 3.4 cm superior to carina. Esophageal tube tip projects over gastroduodenal region 2. Similar appearance of vascular congestion and mild pulmonary edema 3. Small right greater than left pleural effusions with right basilar atelectasis or pneumonia Electronically Signed   By: Donavan Foil M.D.   On: 09/23/2017 22:20    Scheduled Meds: . benztropine  0.5 mg Oral Daily  . chlorhexidine gluconate (MEDLINE KIT)  15 mL Mouth Rinse BID  . enoxaparin (LOVENOX) injection  65 mg Subcutaneous Q12H  . fentaNYL (SUBLIMAZE) injection  50 mcg Intravenous Once  . furosemide  40 mg Intravenous Once  . hydrocerin   Topical Daily  . ipratropium-albuterol  3 mL Nebulization Q6H  . mouth rinse  15 mL Mouth Rinse QID  . metoprolol tartrate  2.5 mg Intravenous Q12H  . pantoprazole (PROTONIX) IV  40 mg Intravenous Daily  . risperiDONE  3 mg Oral BID  . sodium chloride flush  3 mL Intravenous Q12H  . triamcinolone cream  1 application Topical BID   Continuous Infusions: . sodium chloride    . ampicillin-sulbactam (UNASYN) IV Stopped (09/25/17 1845)  . dextrose 5 % and 0.9% NaCl 50 mL/hr at 09/25/17 0816  . fentaNYL infusion INTRAVENOUS 125 mcg/hr (09/25/17 2012)  . propofol (DIPRIVAN) infusion 40 mcg/kg/min (09/25/17 2012)    Time spent: 30 minutes (> 50% dedicated to face to face examination and coordination of care).   Barton Dubois  [facility] Pager [phone]. If 7PM-7AM, please contact night-coverage at [email or web address], password [facility] 09/25/2017, 8:38 PM  LOS: 2 days

## 2017-09-25 NOTE — Progress Notes (Signed)
SLP Cancellation Note  Patient Details Name: Nancy Erickson MRN: [id number] DOB: 1955-08-19   Cancelled treatment:       Reason Eval/Treat Not Completed: Medical issues which prohibited therapy;Other (comment)(Pt currently intubated.)  Thank you,  Havery MorosDabney Haisley Arens, CCC-SLP [phone]  [name] 09/25/2017, 3:48 PM

## 2017-09-26 ENCOUNTER — Encounter (HOSPITAL_COMMUNITY): Payer: Self-pay

## 2017-09-26 ENCOUNTER — Inpatient Hospital Stay (HOSPITAL_COMMUNITY): Payer: Medicare Other

## 2017-09-26 LAB — GLUCOSE, CAPILLARY
GLUCOSE-CAPILLARY: 148 mg/dL — AB (ref 65–99)
GLUCOSE-CAPILLARY: 152 mg/dL — AB (ref 65–99)
GLUCOSE-CAPILLARY: 95 mg/dL (ref 65–99)
Glucose-Capillary: 94 mg/dL (ref 65–99)
Glucose-Capillary: 75 mg/dL (ref 65–99)
Glucose-Capillary: 194 mg/dL — ABNORMAL HIGH (ref 65–99)

## 2017-09-26 LAB — BLOOD GAS, ARTERIAL
Acid-Base Excess: 2.1 mmol/L — ABNORMAL HIGH (ref 0.0–2.0)
Acid-Base Excess: 3.5 mmol/L — ABNORMAL HIGH (ref 0.0–2.0)
BICARBONATE: 26.6 mmol/L (ref 20.0–28.0)
Bicarbonate: 25.4 mmol/L (ref 20.0–28.0)
DRAWN BY: 270161
DRAWN BY: 270161
FIO2: 40
FIO2: 40
MODE: POSITIVE
Mode: POSITIVE
O2 SAT: 95.2 %
O2 Saturation: 95.3 %
PATIENT TEMPERATURE: 37
PCO2 ART: 51.5 mmHg — AB (ref 32.0–48.0)
PEEP/CPAP: 5 cmH2O
PEEP: 5 cmH2O
PH ART: 7.355 (ref 7.350–7.450)
PO2 ART: 87.3 mmHg (ref 83.0–108.0)
PRESSURE SUPPORT: 13 cmH2O
Patient temperature: 36.7
Pressure support: 13 cmH2O
pCO2 arterial: 52.5 mmHg — ABNORMAL HIGH (ref 32.0–48.0)
pH, Arterial: 7.343 — ABNORMAL LOW (ref 7.350–7.450)
pO2, Arterial: 89.5 mmHg (ref 83.0–108.0)

## 2017-09-26 LAB — BASIC METABOLIC PANEL
Anion gap: 11 (ref 5–15)
BUN: 8 mg/dL (ref 6–20)
CALCIUM: 7.4 mg/dL — AB (ref 8.9–10.3)
CO2: 25 mmol/L (ref 22–32)
Chloride: 100 mmol/L — ABNORMAL LOW (ref 101–111)
Creatinine, Ser: 0.59 mg/dL (ref 0.44–1.00)
GFR calc Af Amer: >60 mL/min (ref >60)
GFR calc non Af Amer: >60 mL/min (ref >60)
GLUCOSE: 96 mg/dL (ref 65–99)
POTASSIUM: 3.8 mmol/L (ref 3.5–5.1)
Sodium: 136 mmol/L (ref 135–145)

## 2017-09-26 LAB — TRIGLYCERIDES: Triglycerides: 61 mg/dL (ref <150)

## 2017-09-26 LAB — PHOSPHORUS
Phosphorus: 4.4 mg/dL (ref 2.5–4.6)
Phosphorus: 5 mg/dL — ABNORMAL HIGH (ref 2.5–4.6)

## 2017-09-26 LAB — MAGNESIUM
MAGNESIUM: 2.1 mg/dL (ref 1.7–2.4)
Magnesium: 2.1 mg/dL (ref 1.7–2.4)

## 2017-09-26 MED ORDER — ENOXAPARIN SODIUM 80 MG/0.8ML ~~LOC~~ SOLN
80.0000 mg | Freq: Two times a day (BID) | SUBCUTANEOUS | Status: DC
  Administered 2017-09-26 – 2017-09-28 (×4): 80 mg via SUBCUTANEOUS
  Filled 2017-09-26 (×4): qty 0.8

## 2017-09-26 MED ORDER — FENTANYL CITRATE (PF) 2500 MCG/50ML IJ SOLN
INTRAMUSCULAR | Status: AC
  Filled 2017-09-26: qty 50

## 2017-09-26 MED ORDER — VITAL HIGH PROTEIN PO LIQD
1000.0000 mL | ORAL | Status: DC
  Filled 2017-09-26 (×4): qty 1000

## 2017-09-26 MED ORDER — METHYLPREDNISOLONE SODIUM SUCC 40 MG IJ SOLR
40.0000 mg | Freq: Two times a day (BID) | INTRAMUSCULAR | Status: DC
  Administered 2017-09-26 – 2017-09-30 (×9): 40 mg via INTRAVENOUS
  Filled 2017-09-26 (×9): qty 1

## 2017-09-26 MED ORDER — PRO-STAT SUGAR FREE PO LIQD
30.0000 mL | Freq: Two times a day (BID) | ORAL | Status: DC
  Administered 2017-09-26 – 2017-09-30 (×8): 30 mL
  Filled 2017-09-26 (×8): qty 30

## 2017-09-26 NOTE — Progress Notes (Signed)
[facility] PROGRESS NOTE  Nancy Erickson EQA:[id number] DOB: 03-Mar-1956 DOA: 09/23/2017 PCP: Aura Dials, MD  Interim summary and HPI 62 y.o. 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  Patient is currently unresponsive on BiPAP and careworkers at bedside state that she has been drinking an excessive amount of water recently.  She has been taking her medications as otherwise prescribed including her Xarelto.  Case further complicated for acute resp failure with hypoxia/hypercapnia and concerns for aspiration PNA. Mechanically ventilated and started on Unasyn.  Assessment/Plan: 1-acute encephalopathy: hypercapnic and metabolic (in the setting of hyponatremia) -continue to follow electrolytes -patient hyponatremic in the setting of psychogenic polydipsia; sodium 136 now. Will monitor -patient's resp failure most likely triggered by aspiration pneumonitis/pneumonia. -X-ray this morning no showing significant infiltrates but with positive emphysematous changes. -Continue Unasyn -will continue ventilatory support and follow pulmonary rec's for weaning and extubation.  2-acute colitis: presume to be infectious -No fever, no abdominal pain -Continue Unasyn -Follow clinical response and continue supportive care. -Once extubated will slowly advance diet and assess tolerance.   3-paranoid schizophrenia  -Continue home medications through OG tube -patient is calm and following commands properly during awaken evaluations.  4-HTN -Overall is stable and well-controlled. -Continue Lopressor -Continue PRN hydralazine.    5-hx of PE -No PE seen on CTA during this admission. -Continue Lovenox  per pharmacy while not taking p.o.'s -Anticipated plan to resume home anticoagulation regimen once able to take PO's  6-hypokalemia -Potassium repleted -Magnesium within normal limits   Code Status: Full code Family Communication: no family at bedside  Disposition Plan: remains in ICU, continue mechanical ventilatory support; continue current antibiotics; follow pulmonary rec's. Follow electrolytes and further replete them as needed.  Hoping that the patient will be able to be wean off ventilator later today.   Consultants:  PCC/critical care  Procedures:  See below for x-ray reports   ETT and Mechanical ventilation   Antibiotics:  unasyn 2/19  HPI/Subjective: Appears very comfortable, afebrile, no chest pain, no using accessory muscles.  Patient is intubated and mechanically ventilated at this moment.  Objective: Vitals:   09/26/17 1500 09/26/17 1600  BP: 131/68 (!) 142/76  Pulse: (!) 116 (!) 134  Resp: 17 (!) 31  Temp:    SpO2: 93% 95%    Intake/Output Summary (Last 24 hours) at 09/26/2017 1623 Last data filed at 09/26/2017 1400 Gross per 24 hour  Intake 2142.8 ml  Output 750 ml  Net 1392.8 ml   Filed Weights   09/24/17 1822 09/25/17 0500 09/26/17 0500  Weight: 74.3 kg (163 lb 12.8 oz) 73.9 kg (162 lb 14.7 oz) 74.9 kg (165 lb 2 oz)    Exam:   General: Afebrile, patient is still intubated and receiving mechanical ventilation.  Appears comfortable, able to follow commands, denying chest pain or abdominal pain.    Cardiovascular: S1 and S2, no rubs, no gallops, no JVD appreciated on exam.  Respiratory: Positive expiratory wheezing, positive rhonchi, improved air movement bilaterally, no appreciated crackles and no using accessory muscles.    Abdomen: Soft, nontender, positive bowel sounds, no guarding  Musculoskeletal: No edema, no cyanosis, no clubbing.  Data Reviewed: Basic Metabolic Panel: Recent Labs  Lab 09/23/17 1040 09/23/17 1104  09/23/17 1426 09/24/17 0540 09/25/17 0523 09/26/17  0439 09/26/17 0736  NA 114* 114* 116* 122* 133* 136  --   K 4.1 4.3 4.2 3.9 3.4* 3.8  --   CL 75* 74* 80* 88* 95* 100*  --   CO2 30  --  _0 --   GLUCOSE 109* 107* 108* 71 88 96  --   BUN _1 --   CREATININE 0.41* 0.60 0.34* 0.51 0.59 0.59  --   CALCIUM 8.0*  --  7.5* 7.1* 7.6* 7.4*  --   MG  --   --   --   --   --  2.1  --   PHOS  --   --   --   --   --   --  4.4   Liver Function Tests: Recent Labs  Lab 09/23/17 1040  AST 27  ALT 29  ALKPHOS 84  BILITOT 0.4  PROT 6.5  ALBUMIN 3.4*   CBC: Recent Labs  Lab 09/23/17 1040 09/23/17 1104 09/24/17 0540 09/25/17 0523  WBC 12.6*  --  10.9* 7.5  NEUTROABS 10.8*  --   --   --   HGB 13.0 15.3* 11.8* 11.9*  HCT 39.1 45.0 36.6 37.4  MCV 86.9  --  87.1 87.0  PLT 310  --  265 336   CBG: Recent Labs  Lab 09/25/17 1947 09/26/17 0006 09/26/17 0356 09/26/17 0755 09/26/17 1201  GLUCAP 95 95 94 75 152*   Studies: Dg Chest 1 View  Result Date: 09/26/2017 CLINICAL DATA:  Respiratory failure intubated patient. EXAM: CHEST 1 VIEW COMPARISON:  Single-view of the chest 09/25/2017 and 09/23/2017. CT chest 09/23/2017. FINDINGS: ETT and NG tube are unchanged. The lungs are emphysematous. Airspace disease in the lung bases, worse on the right, is unchanged. Heart size is upper normal. No pneumothorax. IMPRESSION: Support tubes projecting good position. No change in right worse than left basilar airspace disease. Emphysema. Electronically Signed   By: Inge Rise M.D.   On: 09/26/2017 10:49   Dg Chest Port 1 View  Result Date: 09/25/2017 CLINICAL DATA:  Hypoxia. EXAM: PORTABLE CHEST 1 VIEW COMPARISON:  09/23/2017. FINDINGS: Endotracheal tube and NG tube in stable position. Stable mild cardiomegaly. Progressive bilateral interstitial prominence and small right pleural effusions suggesting CHF. Consolidation in the right lower lung. Pneumonia may also be present. No  pneumothorax. IMPRESSION: 1.  Lines and tubes in stable position. 2. Stable cardiomegaly. Progressive bilateral interstitial prominence of small right pleural effusions suggesting CHF. Consolidation in the right lung base noted. Pneumonia may also be present. Electronically Signed   By: [place]   On: 09/25/2017 07:24    Scheduled Meds: . benztropine  0.5 mg Oral Daily  . chlorhexidine gluconate (MEDLINE KIT)  15 mL Mouth Rinse BID  . enoxaparin (LOVENOX) injection  80 mg Subcutaneous Q12H  . feeding supplement (PRO-STAT SUGAR FREE 64)  30 mL Per Tube BID  . feeding supplement (VITAL HIGH PROTEIN)  1,000 mL Per Tube Q24H  . fentaNYL (SUBLIMAZE) injection  50 mcg Intravenous Once  . furosemide  40 mg Intravenous Once  . hydrocerin   Topical Daily  . ipratropium-albuterol  3 mL Nebulization Q6H  . mouth rinse  15 mL Mouth Rinse QID  . methylPREDNISolone (SOLU-MEDROL) injection  40 mg Intravenous Q12H  . metoprolol tartrate  2.5 mg Intravenous Q12H  . pantoprazole (PROTONIX) IV  40 mg Intravenous Daily  . risperiDONE  3 mg Oral BID  . sodium  chloride flush  3 mL Intravenous Q12H  . triamcinolone cream  1 application Topical BID   Continuous Infusions: . sodium chloride    . ampicillin-sulbactam (UNASYN) IV Stopped (09/26/17 1259)  . dextrose 5 % and 0.9% NaCl 50 mL/hr at 09/26/17 0536  . fentaNYL infusion INTRAVENOUS 100 mcg/hr (09/26/17 0419)  . propofol (DIPRIVAN) infusion Stopped (09/26/17 1100)    Time spent: 30 minutes (> 50% dedicated to face to face examination and coordination of care).   Barton Dubois  [facility] Pager [phone]. If 7PM-7AM, please contact night-coverage at [email or web address], password [facility] 09/26/2017, 4:23 PM  LOS: 3 days

## 2017-09-26 NOTE — Progress Notes (Signed)
Subjective: She remains intubated and on the ventilator.  She did not have good respiratory effort yesterday.  She has substantial smoking history and I think she has COPD but that is not listed on her problem list.  She was markedly hyponatremic on admission and had markedly altered mental status.  She had vomiting and aspirated and has aspiration pneumonia Objective: Vital signs in last 24 hours: Temp:  [97.5 F (36.4 C)-98.1 F (36.7 C)] 98 F (36.7 C) (02/22 0400) Pulse Rate:  [53-107] 72 (02/22 0345) Resp:  [14-25] 18 (02/22 0345) BP: (96-146)/(59-84) 124/71 (02/22 0345) SpO2:  [94 %-100 %] 97 % (02/22 0345) FiO2 (%):  [40 %] 40 % (02/22 0303) Weight:  [74.9 kg (165 lb 2 oz)] 74.9 kg (165 lb 2 oz) (02/22 0500) Weight change: 0.6 kg (1 lb 5.2 oz)    Intake/Output from previous day: 02/21 0701 - 02/22 0700 In: 2233.4 [I.V.:2133.4; IV Piggyback:100] Out: 750 [Urine:750]  PHYSICAL EXAM General appearance: Intubated, sedated will open her eyes. Resp: Wheezing bilaterally.  Rales in the right base Cardio: regular rate and rhythm, S1, S2 normal, no murmur, click, rub or gallop GI: soft, non-tender; bowel sounds normal; no masses,  no organomegaly Extremities: extremities normal, atraumatic, no cyanosis or edema Skin warm and dry  Lab Results:  Results for orders placed or performed during the hospital encounter of 09/23/17 (from the past 48 hour(s))  CBG monitoring, ED     Status: None   Collection Time: 09/24/17  7:59 AM  Result Value Ref Range   Glucose-Capillary 74 65 - 99 mg/dL   Comment 1 Document in Chart   MRSA PCR Screening     Status: None   Collection Time: 09/24/17  6:28 PM  Result Value Ref Range   MRSA by PCR NEGATIVE NEGATIVE    Comment:        The GeneXpert MRSA Assay (FDA approved for NASAL specimens only), is one component of a comprehensive MRSA colonization surveillance program. It is not intended to diagnose MRSA infection nor to guide or monitor  treatment for MRSA infections. Performed at [facility], [address] [place], [place] [address]   CBC     Status: Abnormal   Collection Time: 09/25/17  5:23 AM  Result Value Ref Range   WBC 7.5 4.0 - 10.5 K/uL   RBC 4.30 3.87 - 5.11 MIL/uL   Hemoglobin 11.9 (L) 12.0 - 15.0 g/dL   HCT 37.4 36.0 - 46.0 %   MCV 87.0 78.0 - 100.0 fL   MCH 27.7 26.0 - 34.0 pg   MCHC 31.8 30.0 - 36.0 g/dL   RDW 14.7 11.5 - 15.5 %   Platelets 336 150 - 400 K/uL    Comment: Performed at [facility], [address] [place],  [address]  Basic metabolic panel     Status: Abnormal   Collection Time: 09/25/17  5:23 AM  Result Value Ref Range   Sodium 133 (L) 135 - 145 mmol/L    Comment: DELTA CHECK NOTED   Potassium 3.4 (L) 3.5 - 5.1 mmol/L   Chloride 95 (L) 101 - 111 mmol/L   CO2 27 22 - 32 mmol/L   Glucose, Bld 88 65 - 99 mg/dL   BUN 9 6 - 20 mg/dL   Creatinine, Ser 0.59 0.44 - 1.00 mg/dL   Calcium 7.6 (L) 8.9 - 10.3 mg/dL   GFR calc non Af Amer >60 >60 mL/min   GFR calc Af Amer >60 >60 mL/min  Comment: (NOTE) The eGFR has been calculated using the CKD EPI equation. This calculation has not been validated in all clinical situations. eGFR's persistently <60 mL/min signify possible Chronic Kidney Disease.    Anion gap 11 5 - 15    Comment: Performed at [facility], [address] [place], [place] [address]  Triglycerides     Status: None   Collection Time: 09/25/17  5:23 AM  Result Value Ref Range   Triglycerides 67 <150 mg/dL    Comment: Performed at [facility], [address], [place], [place] [address]  Glucose, capillary     Status: None   Collection Time: 09/25/17  8:03 AM  Result Value Ref Range   Glucose-Capillary 86 65 - 99 mg/dL  Glucose, capillary     Status: None   Collection Time: 09/25/17 11:25 AM  Result Value Ref Range   Glucose-Capillary 84 65 - 99 mg/dL  Glucose, capillary     Status: Abnormal   Collection Time: 09/25/17  4:27 PM  Result Value Ref Range    Glucose-Capillary 62 (L) 65 - 99 mg/dL   Comment 1 Notify RN   Glucose, capillary     Status: Abnormal   Collection Time: 09/25/17  6:21 PM  Result Value Ref Range   Glucose-Capillary 100 (H) 65 - 99 mg/dL  Glucose, capillary     Status: None   Collection Time: 09/25/17  7:47 PM  Result Value Ref Range   Glucose-Capillary 95 65 - 99 mg/dL   Comment 1 Notify RN   Glucose, capillary     Status: None   Collection Time: 09/26/17 12:06 AM  Result Value Ref Range   Glucose-Capillary 95 65 - 99 mg/dL   Comment 1 Notify RN   Glucose, capillary     Status: None   Collection Time: 09/26/17  3:56 AM  Result Value Ref Range   Glucose-Capillary 94 65 - 99 mg/dL   Comment 1 Notify RN   Basic metabolic panel     Status: Abnormal   Collection Time: 09/26/17  4:39 AM  Result Value Ref Range   Sodium 136 135 - 145 mmol/L   Potassium 3.8 3.5 - 5.1 mmol/L   Chloride 100 (L) 101 - 111 mmol/L   CO2 25 22 - 32 mmol/L   Glucose, Bld 96 65 - 99 mg/dL   BUN 8 6 - 20 mg/dL   Creatinine, Ser 0.59 0.44 - 1.00 mg/dL   Calcium 7.4 (L) 8.9 - 10.3 mg/dL   GFR calc non Af Amer >60 >60 mL/min   GFR calc Af Amer >60 >60 mL/min    Comment: (NOTE) The eGFR has been calculated using the CKD EPI equation. This calculation has not been validated in all clinical situations. eGFR's persistently <60 mL/min signify possible Chronic Kidney Disease.    Anion gap 11 5 - 15    Comment: Performed at [facility], [address] [place], [place] [address]  Magnesium     Status: None   Collection Time: 09/26/17  4:39 AM  Result Value Ref Range   Magnesium 2.1 1.7 - 2.4 mg/dL    Comment: Performed at [facility], [address] [place],  [address]    ABGS Recent Labs    09/23/17 1104  09/24/17 0500  PHART  --    < > 7.411  PO2ART  --    < > 88.9  TCO2 32  --   --   HCO3  --    < >  29.4*   < > = values in this interval not displayed.   CULTURES Recent Results (from the past 240 hour(s))  MRSA PCR  Screening     Status: None   Collection Time: 09/24/17  6:28 PM  Result Value Ref Range Status   MRSA by PCR NEGATIVE NEGATIVE Final    Comment:        The GeneXpert MRSA Assay (FDA approved for NASAL specimens only), is one component of a comprehensive MRSA colonization surveillance program. It is not intended to diagnose MRSA infection nor to guide or monitor treatment for MRSA infections. Performed at [facility], [address] [place], [place] [address]    Studies/Results: Dg Chest Port 1 View  Result Date: 09/25/2017 CLINICAL DATA:  Hypoxia. EXAM: PORTABLE CHEST 1 VIEW COMPARISON:  09/23/2017. FINDINGS: Endotracheal tube and NG tube in stable position. Stable mild cardiomegaly. Progressive bilateral interstitial prominence and small right pleural effusions suggesting CHF. Consolidation in the right lower lung. Pneumonia may also be present. No pneumothorax. IMPRESSION: 1.  Lines and tubes in stable position. 2. Stable cardiomegaly. Progressive bilateral interstitial prominence of small right pleural effusions suggesting CHF. Consolidation in the right lung base noted. Pneumonia may also be present. Electronically Signed   By: Marcello Moores  Register   On: 09/25/2017 07:24    Medications:  Prior to Admission:  Medications Prior to Admission  Medication Sig Dispense Refill Last Dose  . amLODipine (NORVASC) 10 MG tablet Take 10 mg by mouth daily.   09/23/2017 at Unknown time  . benztropine (COGENTIN) 1 MG tablet Take 0.5 tablets (0.5 mg total) by mouth daily. 30 tablet 0 09/23/2017 at Unknown time  . loratadine (CLARITIN) 10 MG tablet Take 10 mg by mouth daily.   09/23/2017 at Unknown time  . LORazepam (ATIVAN) 1 MG tablet Take 1 tablet (1 mg total) by mouth every 4 (four) hours as needed for anxiety or sleep. (Patient taking differently: Take 1 mg by mouth 3 (three) times daily as needed for anxiety. ) 30 tablet 0 Past Week at Unknown time  . metoprolol tartrate (LOPRESSOR) 25 MG tablet  Take 25 mg by mouth 2 (two) times daily.   09/23/2017 at 0730  . risperiDONE (RISPERDAL) 3 MG tablet Take 3 mg by mouth 2 (two) times daily.   09/23/2017 at Unknown time  . rivaroxaban (XARELTO) 20 MG TABS tablet Take 1 tablet (20 mg total) by mouth daily with supper. 30 tablet 0 09/22/2017 at Unknown time  . Skin Protectants, Misc. (EUCERIN) cream Apply topically daily. Tac/euc 0.1% 1:4 Apply sparingly to rash on lower legs   Past Week at Unknown time  . triamcinolone cream (KENALOG) 0.1 % Apply 1 application topically 2 (two) times daily.    Past Week at Unknown time   Scheduled: . benztropine  0.5 mg Oral Daily  . chlorhexidine gluconate (MEDLINE KIT)  15 mL Mouth Rinse BID  . enoxaparin (LOVENOX) injection  65 mg Subcutaneous Q12H  . fentaNYL (SUBLIMAZE) injection  50 mcg Intravenous Once  . furosemide  40 mg Intravenous Once  . hydrocerin   Topical Daily  . ipratropium-albuterol  3 mL Nebulization Q6H  . mouth rinse  15 mL Mouth Rinse QID  . metoprolol tartrate  2.5 mg Intravenous Q12H  . pantoprazole (PROTONIX) IV  40 mg Intravenous Daily  . risperiDONE  3 mg Oral BID  . sodium chloride flush  3 mL Intravenous Q12H  . triamcinolone cream  1 application Topical BID   Continuous: .  sodium chloride    . ampicillin-sulbactam (UNASYN) IV 3 g (09/26/17 0531)  . dextrose 5 % and 0.9% NaCl 50 mL/hr at 09/26/17 0536  . fentaNYL infusion INTRAVENOUS 100 mcg/hr (09/26/17 0419)  . propofol (DIPRIVAN) infusion 35 mcg/kg/min (09/26/17 0534)   ENI:DPOEUM chloride, acetaminophen **OR** acetaminophen, fentaNYL, fentaNYL (SUBLIMAZE) injection, fentaNYL (SUBLIMAZE) injection, hydrALAZINE, ondansetron **OR** ondansetron (ZOFRAN) IV, sodium chloride flush  Assesment: She was admitted with altered mental status nausea vomiting with aspiration and acute respiratory failure.  She required intubation and mechanical ventilation.  She is being treated for aspiration pneumonia.    I think she has COPD.  I  will add steroids she is on inhaled bronchodilators   She has pulmonary hypertension by history and has cardiomyopathy by history.  Chest x-ray has suggested some volume overload and she has received Lasix  She had marked hyponatremia on admission.  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 Melissa Noon was drinking too much but I think that based simply on a fluid restriction at her assisted living facility  She has metabolic encephalopathy from hyponatremia and from her baseline schizophrenia Principal Problem:   Acute metabolic encephalopathy Active Problems:   HTN (hypertension)   Hyponatremia   Pulmonary embolus ([place])   Paranoid schizophrenia ([place])   Cardiomyopathy ([place])   Pulmonary hypertension ([place])   Acute respiratory failure ([place])    Plan: Attempt weaning today.  If unable to come off the ventilator she will need to start tube feedings watch for nausea and vomiting again I think that likely was related to her hyponatremia    LOS: 3 days   Matias Thurman L 09/26/2017, 7:16 AM

## 2017-09-26 NOTE — Progress Notes (Signed)
The patient was extubated after weaning for approx 1.5 hours on 13/5 CP/PSV. She did really well and was getting Vte of 400-500 at times. Her RR would go up at times but she just had to be reminded to slow down her respirations. ABGs looked good after weaning. I called the results into Dr. Gwenlyn PerkingMadera who agreed with me on an extubation at this time. I extubated pt to 4lpm [place] without complication. She is hoarse but able to vocalize. Her SPO2 at this time is 93%. RT will continue to monitor patient throughout shift.

## 2017-09-26 NOTE — Progress Notes (Signed)
ANTICOAGULATION CONSULT NOTE   Pharmacy Consult for lovenox Indication: pulmonary embolus  Allergies  Allergen Reactions  . Phosphate Nausea And Vomiting  . Artane [Trihexyphenidyl] Other (See Comments)    unknown  . Codeine Other (See Comments)    unknown   Patient Measurements: Height: 5\' 4"  (162.6 cm) Weight: 165 lb 2 oz (74.9 kg) IBW/kg (Calculated) : 54.7 Dosing Weight: 63 kg  Vital Signs: Temp: 98 F (36.7 C) (02/22 0819) Temp Source: Axillary (02/22 0819) BP: 124/71 (02/22 0345) Pulse Rate: 72 (02/22 0345)  Labs: Recent Labs    09/23/17 1104  09/24/17 0540 09/25/17 0523 09/26/17 0439  HGB 15.3*  --  11.8* 11.9*  --   HCT 45.0  --  36.6 37.4  --   PLT  --   --  265 336  --   CREATININE 0.60   < > 0.51 0.59 0.59   < > = values in this interval not displayed.   Estimated Creatinine Clearance: 75.1 mL/min (by C-G formula based on SCr of 0.59 mg/dL).  Medical History: Past Medical History:  Diagnosis Date  . Hypertension   . Hyponatremia   . Paranoid schizophrenia (HCC)   . Pulmonary embolus (HCC)   . Venous insufficiency of right lower extremity    Medications:  Was on xarelto PTA  Assessment: 62 yo lady to start lovenox for h/o PE while she is NPO.    Goal of Therapy:  Anti-Xa level 0.6-1 units/ml 4hrs after LMWH dose given Monitor platelets by anticoagulation protocol: Yes   Plan:  Lovenox 1mg /Kg (80mg ) sq q12 hours CBC q MWF  Jewels Langone A 09/26/2017,10:50 AM

## 2017-09-26 NOTE — Progress Notes (Addendum)
CP/PSV wean attempted at this time. PS 8 above peep and peep set at 5. The patient was having to be coached the entire length of weaning trial of 10 mins. SPO2 dropped to 88 and VT was sustaining in the mid to low 200s. RR would be in the mid to high 30s. RT will consult with the RN and attempt again at a later time today. PT ultimately placed back in Full Support mode of PRVC to rest. There were no complications noted and patient resting at this time.

## 2017-09-26 NOTE — Progress Notes (Signed)
This RN attempted to give pt's ice chips and sips of water. Pt did not tolerate sips of water. After receiving a sip of water from a cup pt began to cough continuously. Pt stated she was not choking, but continued to cough. Pt seems to be a little "off" developmentally. Pt did tolerate ice chips. This RN will not give PO meds tonight.  Genelle Balameron D Aaryana Betke, RN

## 2017-09-26 NOTE — Progress Notes (Signed)
Per Dr. Juanetta GoslingHawkins recommendation, tube feedings to start only after weaning attempt if unable to get off of ventilator.

## 2017-09-26 NOTE — Progress Notes (Signed)
SLP Cancellation Note  Patient Details Name: Nancy Erickson MRN: [id number] DOB: May 28, 1956   Cancelled treatment:       Reason Eval/Treat Not Completed: Medical issues which prohibited therapy;Patient not medically ready; Pt still intubated. SLP will sign off, please re-consult when Pt extubated and maintaining appropriate level of alertness for BSE.  Thank you,  Havery MorosDabney Cheryn Lundquist, CCC-SLP [phone]    [name] 09/26/2017, 1:31 PM

## 2017-09-26 NOTE — Progress Notes (Signed)
Nutrition Brief Note  RD was consulted for tube feeding in event patient was unable to be extubated today.   Spoke with RN and RT. Highly Anticipated patient will be able to successfully extubate later this afternoon or early tomorrow.   Will complete consult.   If patient fails to extubate, please re consult RD.   Christophe LouisNathan Franks RD, LDN, CNSC Clinical Nutrition Pager: [phone] 09/26/2017 3:43 PM

## 2017-09-27 ENCOUNTER — Inpatient Hospital Stay (HOSPITAL_COMMUNITY): Payer: Medicare Other

## 2017-09-27 DIAGNOSIS — I10 Essential (primary) hypertension: Secondary | ICD-10-CM

## 2017-09-27 DIAGNOSIS — E876 Hypokalemia: Secondary | ICD-10-CM

## 2017-09-27 LAB — GLUCOSE, CAPILLARY
GLUCOSE-CAPILLARY: 127 mg/dL — AB (ref 65–99)
GLUCOSE-CAPILLARY: 130 mg/dL — AB (ref 65–99)
Glucose-Capillary: 132 mg/dL — ABNORMAL HIGH (ref 65–99)
Glucose-Capillary: 127 mg/dL — ABNORMAL HIGH (ref 65–99)
Glucose-Capillary: 196 mg/dL — ABNORMAL HIGH (ref 65–99)
Glucose-Capillary: 141 mg/dL — ABNORMAL HIGH (ref 65–99)

## 2017-09-27 LAB — MAGNESIUM
Magnesium: 2.1 mg/dL (ref 1.7–2.4)
Magnesium: 2.1 mg/dL (ref 1.7–2.4)

## 2017-09-27 LAB — PHOSPHORUS
PHOSPHORUS: 2.6 mg/dL (ref 2.5–4.6)
Phosphorus: 3.5 mg/dL (ref 2.5–4.6)

## 2017-09-27 MED ORDER — IPRATROPIUM-ALBUTEROL 0.5-2.5 (3) MG/3ML IN SOLN
3.0000 mL | Freq: Four times a day (QID) | RESPIRATORY_TRACT | Status: DC
  Administered 2017-09-27 – 2017-09-28 (×6): 3 mL via RESPIRATORY_TRACT
  Filled 2017-09-27 (×6): qty 3

## 2017-09-27 MED ORDER — METOPROLOL TARTRATE 25 MG PO TABS
25.0000 mg | ORAL_TABLET | Freq: Two times a day (BID) | ORAL | Status: DC
  Administered 2017-09-27: 25 mg via ORAL
  Filled 2017-09-27: qty 1

## 2017-09-27 NOTE — Progress Notes (Signed)
[facility] PROGRESS NOTE  Nancy Erickson RDE:[id number] DOB: Jul 19, 1956 DOA: 09/23/2017 PCP: Aura Dials, MD  Interim summary and HPI 62 y.o. 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  Patient is currently unresponsive on BiPAP and careworkers at bedside state that she has been drinking an excessive amount of water recently.  She has been taking her medications as otherwise prescribed including her Xarelto.  Case further complicated for acute resp failure with hypoxia/hypercapnia and concerns for aspiration PNA. Mechanically ventilated and started on Unasyn.  Assessment/Plan: 1-acute encephalopathy: hypercapnic and metabolic (in the setting of hyponatremia) -continue to follow electrolytes and avoid over drinking (had hx of psychogenic polydipsia) -patient hyponatremic on admission. Last sodium 136; will recheck BMET in am -patient's resp failure most likely triggered by aspiration pneumonitis/pneumonia. -X-ray on 2/22 no showing significant infiltrates but with positive emphysematous changes (most likely suggesting more pneumonitis than aspiration pneumonia) -Continue Unasyn, as intended to treat presumed infectious colitis. -patient extubated and protecting airways.   2-acute colitis: presume to be infectious -No fever, no abdominal pain -Continue Unasyn, will aim for a 7 days treatment  -Follow clinical response and continue supportive care. -advancing diet as per speech recommendations   3-paranoid schizophrenia  -Continue home medications, transitioning to PO now  4-HTN -Continue Lopressor, now transition to PO -Continue PRN hydralazine.    5-hx of PE -No PE seen on CTA  during this admission. -Continue Lovenox per pharmacy while not taking p.o.'s -if tolerate diet and already started PO meds, will start xarelto by mouth in am   6-hypokalemia -Potassium repleted -Magnesium within normal limits -follow BMET in am   7-abnormal findings on left breast -will need mammogram as an outpatient    Code Status: Full code Family Communication: no family at bedside  Disposition Plan: will transfer to med-surg; continue current antibiotics; follow pulmonary rec's. Following speech therapy will start dysphagia 3 and thin liquids.  Consultants:  PCC/critical care  Procedures:  See below for x-ray reports   ETT and Mechanical ventilation   Antibiotics:  unasyn 2/19  HPI/Subjective: Afebrile, oriented X2, fair insight, asking for her psych meds to be resumed. No CP, no abd pain, no nausea, no vomiting.   Objective: Vitals:   09/27/17 1242 09/27/17 1451  BP:    Pulse:    Resp:    Temp: 99 F (37.2 C)   SpO2:  96%    Intake/Output Summary (Last 24 hours) at 09/27/2017 1653 Last data filed at 09/27/2017 1534 Gross per 24 hour  Intake 1034.16 ml  Output 4250 ml  Net -3215.84 ml   Filed Weights   09/25/17 0500 09/26/17 0500 09/27/17 0400  Weight: 73.9 kg (162 lb 14.7 oz) 74.9 kg (165 lb 2 oz) 73.5 kg (162 lb 0.6 oz)    Exam:   General: Afebrile, no chest pain, patient was successfully extubated on 2/22 evening.  Reports no nausea, no vomiting and asking to resume her meds and to have something to eat.   Cardiovascular: S1 and S2, no rubs, no gallops, no JVD  Respiratory: improved air movement, mild exp wheezing, no crackles, no using accessory muscles.  Abdomen: soft, NT, ND, positive BS  Musculoskeletal: no edema, no cyanosis   Data Reviewed: Basic Metabolic Panel: Recent Labs  Lab 09/23/17  1040 09/23/17 1104 09/23/17 1426 09/24/17 0540 09/25/17 0523 09/26/17 0439 09/26/17 0736 09/26/17 1733 09/27/17 0451  NA 114* 114* 116*  122* 133* 136  --   --   --   K 4.1 4.3 4.2 3.9 3.4* 3.8  --   --   --   CL 75* 74* 80* 88* 95* 100*  --   --   --   CO2 30  --  _0 --   --   --   GLUCOSE 109* 107* 108* 71 88 96  --   --   --   BUN _1 --   --   --   CREATININE 0.41* 0.60 0.34* 0.51 0.59 0.59  --   --   --   CALCIUM 8.0*  --  7.5* 7.1* 7.6* 7.4*  --   --   --   MG  --   --   --   --   --  2.1  --  2.1 2.1  PHOS  --   --   --   --   --   --  4.4 5.0* 3.5   Liver Function Tests: Recent Labs  Lab 09/23/17 1040  AST 27  ALT 29  ALKPHOS 84  BILITOT 0.4  PROT 6.5  ALBUMIN 3.4*   CBC: Recent Labs  Lab 09/23/17 1040 09/23/17 1104 09/24/17 0540 09/25/17 0523  WBC 12.6*  --  10.9* 7.5  NEUTROABS 10.8*  --   --   --   HGB 13.0 15.3* 11.8* 11.9*  HCT 39.1 45.0 36.6 37.4  MCV 86.9  --  87.1 87.0  PLT 310  --  265 336   CBG: Recent Labs  Lab 09/26/17 2007 09/27/17 0012 09/27/17 0407 09/27/17 0818 09/27/17 1110  GLUCAP 148* 130* 132* 127* 127*   Studies: Dg Chest 1 View  Result Date: 09/27/2017 CLINICAL DATA:  Respiratory failure. EXAM: CHEST 1 VIEW COMPARISON:  Chest x-ray from yesterday. FINDINGS: Interval removal of the endotracheal and enteric tubes. Stable borderline enlarged cardiac silhouette. Unchanged airspace disease in the right greater than left lung bases. Probable small bilateral layering pleural effusions. No pneumothorax. No acute osseous abnormality. IMPRESSION: 1. No significant interval change in right greater than left lung base airspace disease with probable small layering bilateral pleural effusions. Electronically Signed   By: Titus Dubin M.D.   On: 09/27/2017 09:11   Dg Chest 1 View  Result Date: 09/26/2017 CLINICAL DATA:  Respiratory failure intubated patient. EXAM: CHEST 1 VIEW COMPARISON:  Single-view of the chest 09/25/2017 and 09/23/2017. CT chest 09/23/2017. FINDINGS: ETT and NG tube are unchanged. The lungs are emphysematous. Airspace disease in the lung  bases, worse on the right, is unchanged. Heart size is upper normal. No pneumothorax. IMPRESSION: Support tubes projecting good position. No change in right worse than left basilar airspace disease. Emphysema. Electronically Signed   By: Inge Rise M.D.   On: 09/26/2017 10:49    Scheduled Meds: . benztropine  0.5 mg Oral Daily  . chlorhexidine gluconate (MEDLINE KIT)  15 mL Mouth Rinse BID  . enoxaparin (LOVENOX) injection  80 mg Subcutaneous Q12H  . feeding supplement (PRO-STAT SUGAR FREE 64)  30 mL Per Tube BID  . fentaNYL (SUBLIMAZE) injection  50 mcg Intravenous Once  . furosemide  40 mg Intravenous Once  . hydrocerin   Topical Daily  . ipratropium-albuterol  3 mL Nebulization Q6H WA  . mouth rinse  15 mL Mouth Rinse QID  . methylPREDNISolone (SOLU-MEDROL) injection  40 mg Intravenous Q12H  . metoprolol tartrate  2.5 mg Intravenous Q12H  . pantoprazole (PROTONIX) IV  40 mg Intravenous Daily  . risperiDONE  3 mg Oral BID  . sodium chloride flush  3 mL Intravenous Q12H  . triamcinolone cream  1 application Topical BID   Continuous Infusions: . sodium chloride    . ampicillin-sulbactam (UNASYN) IV Stopped (09/27/17 1313)  . dextrose 5 % and 0.9% NaCl 50 mL/hr at 09/27/17 0325  . fentaNYL infusion INTRAVENOUS Stopped (09/26/17 1600)  . propofol (DIPRIVAN) infusion Stopped (09/26/17 1100)    Time spent: 30 minutes    [facility] Pager [phone]. If 7PM-7AM, please contact night-coverage at [email or web address], password [facility] 09/27/2017, 4:53 PM  LOS: 4 days

## 2017-09-27 NOTE — Evaluation (Signed)
Clinical/Bedside Swallow Evaluation Patient Details  Name: Nancy Erickson MRN: [id number] Date of Birth: September 19, 1955  Today's Date: 09/27/2017 Time: SLP Start Time (ACUTE ONLY): 1200 SLP Stop Time (ACUTE ONLY): 1225 SLP Time Calculation (min) (ACUTE ONLY): 25 min  Past Medical History:  Past Medical History:  Diagnosis Date  . Hypertension   . Hyponatremia   . Paranoid schizophrenia (HCC)   . Pulmonary embolus (HCC)   . Venous insufficiency of right lower extremity    Past Surgical History: History reviewed. No pertinent surgical history. 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 Patient is currently unresponsive on BiPAP and careworkers atbedside state that she has been drinking an excessive amount of water recently. She has been taking her medications as otherwise prescribed including her Xarelto. Case further complicated for acute resp failure with hypoxia/hypercapnia and concerns for aspiration PNA. Mechanically ventilated and started on Unasyn. BSE requested as Pt extubated yesterday and Pt with some coughing with water last night.    Assessment / Plan / Recommendation Clinical Impression  Pt alert and cooperative for BSE. Pt extubated yesterday and was apparently coughing with sips of water last night. Oral motor examination essentially WNL, however Pt with suspected baseline decreased lingual coordination and slight speech impairment. Pt also appears to have developmental cognitive impairment, but able to follow all commands and converse (albeit tangentially) with SLP. Pt without overt signs or symptoms of aspiration, but did need cues to decrease rate of intake (she appeared  as if she feared SLP was going to take food/liquid away). Pt consumed regular textures, puree, thin liquids via teaspoon/cup/straw, and po medications whole in puree without incident. Pt noted to have congested cough a few minutes after po trials. Lunch tray was ordered for Pt and set up by SLP. SLP will follow during acute stay for diet tolerance and advancement to regular textures once she appears close to baseline.   SLP Visit Diagnosis: Dysphagia, oropharyngeal phase (R13.12)    Aspiration Risk  Mild aspiration risk    Diet Recommendation Dysphagia 3 (Mech soft);Thin liquid   Liquid Administration via: Cup;Straw Medication Administration: Whole meds with puree Supervision: Patient able to self feed;Intermittent supervision to cue for compensatory strategies Compensations: Slow rate;Small sips/bites Postural Changes: Seated upright at 90 degrees;Remain upright for at least 30 minutes after po intake    Other  Recommendations Oral Care Recommendations: Oral care BID;Staff/trained caregiver to provide oral care Other Recommendations: Clarify dietary restrictions   Follow up Recommendations None      Frequency and Duration min 2x/week  1 week       Prognosis Prognosis for Safe Diet Advancement: Good Barriers to Reach Goals: Cognitive deficits      Swallow Study   General Date of Onset: 09/23/17 HPI: 62 y.o.femalewith medical history significant forprior PE on Xarelto, hypertension, cardiomyopathy, pulmonary hypertension,and 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 Patient is currently unresponsive on BiPAP and careworkers atbedside state that she has been drinking an excessive amount of water recently. She has been taking her medications as otherwise prescribed including her Xarelto. Case further  complicated for acute resp failure with hypoxia/hypercapnia and  concerns for aspiration PNA. Mechanically ventilated and started on Unasyn. BSE requested as Pt extubated yesterday and Pt with some coughing with water last night.  Type of Study: Bedside Swallow Evaluation Previous Swallow Assessment: None on record Diet Prior to this Study: NPO Temperature Spikes Noted: No Respiratory Status: Nasal cannula History of Recent Intubation: Yes Length of Intubations (days): 4 days Date extubated: 09/26/17 Behavior/Cognition: Alert;Cooperative;Pleasant mood Oral Cavity Assessment: Within Functional Limits Oral Care Completed by SLP: Yes Oral Cavity - Dentition: Adequate natural dentition Vision: Functional for self-feeding Self-Feeding Abilities: Able to feed self Patient Positioning: Upright in bed Baseline Vocal Quality: Normal Volitional Cough: Strong;Congested Volitional Swallow: Able to elicit    Oral/Motor/Sensory Function Overall Oral Motor/Sensory Function: Mild impairment Facial ROM: Within Functional Limits Facial Symmetry: Within Functional Limits Facial Strength: Within Functional Limits Facial Sensation: Within Functional Limits Lingual ROM: Within Functional Limits Lingual Symmetry: Within Functional Limits Lingual Strength: (reduced lingual coordination) Lingual Sensation: Within Functional Limits Velum: Within Functional Limits Mandible: Within Functional Limits   Ice Chips Ice chips: Within functional limits Presentation: Spoon   Thin Liquid Thin Liquid: Within functional limits Presentation: Cup;Self Fed;Spoon;Straw    Nectar Thick Nectar Thick Liquid: Not tested   Honey Thick Honey Thick Liquid: Not tested   Puree Puree: Within functional limits Presentation: Self Fed;Spoon   Solid   GO   Solid: Within functional limits Presentation: Self Fed;Spoon Other Comments: mild impulsivity noted with intake       Thank you,  Havery Moros,  CCC-SLP [phone]  Rishard Delange 09/27/2017,1:56 PM

## 2017-09-27 NOTE — Progress Notes (Signed)
Subjective: She was able to be successfully extubated yesterday.  She says she needs her medicines.  She did not tolerate sips of water last night.  Objective: Vital signs in last 24 hours: Temp:  [98 F (36.7 C)-99.1 F (37.3 C)] 98.4 F (36.9 C) (02/23 0800) Pulse Rate:  [75-134] 116 (02/23 0800) Resp:  [9-49] 26 (02/23 0800) BP: (123-167)/(68-154) 139/83 (02/23 0800) SpO2:  [86 %-97 %] 92 % (02/23 0830) FiO2 (%):  [40 %] 40 % (02/22 1433) Weight:  [73.5 kg (162 lb 0.6 oz)] 73.5 kg (162 lb 0.6 oz) (02/23 0400) Weight change: -1.4 kg (-1.4 oz) Last BM Date: (Pt does not know)  Intake/Output from previous day: 02/22 0701 - 02/23 0700 In: 2040.9 [I.V.:1290.9; NG/GT:150; IV Piggyback:600] Out: 2950 [Urine:2950]  PHYSICAL EXAM General appearance: alert and Confused Resp: rhonchi bilaterally Cardio: regular rate and rhythm, S1, S2 normal, no murmur, click, rub or gallop GI: soft, non-tender; bowel sounds normal; no masses,  no organomegaly Extremities: extremities normal, atraumatic, no cyanosis or edema  Lab Results:  Results for orders placed or performed during the hospital encounter of 09/23/17 (from the past 48 hour(s))  Glucose, capillary     Status: None   Collection Time: 09/25/17 11:25 AM  Result Value Ref Range   Glucose-Capillary 84 65 - 99 mg/dL  Glucose, capillary     Status: Abnormal   Collection Time: 09/25/17  4:27 PM  Result Value Ref Range   Glucose-Capillary 62 (L) 65 - 99 mg/dL   Comment 1 Notify RN   Glucose, capillary     Status: Abnormal   Collection Time: 09/25/17  6:21 PM  Result Value Ref Range   Glucose-Capillary 100 (H) 65 - 99 mg/dL  Glucose, capillary     Status: None   Collection Time: 09/25/17  7:47 PM  Result Value Ref Range   Glucose-Capillary 95 65 - 99 mg/dL   Comment 1 Notify RN   Glucose, capillary     Status: None   Collection Time: 09/26/17 12:06 AM  Result Value Ref Range   Glucose-Capillary 95 65 - 99 mg/dL   Comment 1  Notify RN   Glucose, capillary     Status: None   Collection Time: 09/26/17  3:56 AM  Result Value Ref Range   Glucose-Capillary 94 65 - 99 mg/dL   Comment 1 Notify RN   Basic metabolic panel     Status: Abnormal   Collection Time: 09/26/17  4:39 AM  Result Value Ref Range   Sodium 136 135 - 145 mmol/L   Potassium 3.8 3.5 - 5.1 mmol/L   Chloride 100 (L) 101 - 111 mmol/L   CO2 25 22 - 32 mmol/L   Glucose, Bld 96 65 - 99 mg/dL   BUN 8 6 - 20 mg/dL   Creatinine, Ser 0.59 0.44 - 1.00 mg/dL   Calcium 7.4 (L) 8.9 - 10.3 mg/dL   GFR calc non Af Amer >60 >60 mL/min   GFR calc Af Amer >60 >60 mL/min    Comment: (NOTE) The eGFR has been calculated using the CKD EPI equation. This calculation has not been validated in all clinical situations. eGFR's persistently <60 mL/min signify possible Chronic Kidney Disease.    Anion gap 11 5 - 15    Comment: Performed at [facility], [address], [place], [place] [address]  Magnesium     Status: None   Collection Time: 09/26/17  4:39 AM  Result Value Ref Range   Magnesium 2.1  1.7 - 2.4 mg/dL    Comment: Performed at [facility], [address], [place], [place] [address]  Phosphorus     Status: None   Collection Time: 09/26/17  7:36 AM  Result Value Ref Range   Phosphorus 4.4 2.5 - 4.6 mg/dL    Comment: Performed at [facility], [address] [place], [place] [address]  Glucose, capillary     Status: None   Collection Time: 09/26/17  7:55 AM  Result Value Ref Range   Glucose-Capillary 75 65 - 99 mg/dL   Comment 1 Notify RN    Comment 2 Document in Chart   Glucose, capillary     Status: Abnormal   Collection Time: 09/26/17 12:01 PM  Result Value Ref Range   Glucose-Capillary 152 (H) 65 - 99 mg/dL   Comment 1 Notify RN    Comment 2 Document in Chart   Blood gas, arterial     Status: Abnormal   Collection Time: 09/26/17 12:40 PM  Result Value Ref Range   FIO2 40.00    Delivery systems VENTILATOR    Mode CONTINUOUS POSITIVE AIRWAY  PRESSURE    Peep/cpap 5.0 cm H20   Pressure support 13 cm H20   pH, Arterial 7.343 (L) 7.350 - 7.450   pCO2 arterial 51.5 (H) 32.0 - 48.0 mmHg   pO2, Arterial 89.5 83.0 - 108.0 mmHg   Bicarbonate 25.4 20.0 - 28.0 mmol/L   Acid-Base Excess 2.1 (H) 0.0 - 2.0 mmol/L   O2 Saturation 95.2 %   Patient temperature 36.7    Collection site RIGHT RADIAL    Drawn by [id number]    Sample type ARTERIAL DRAW    Allens test (pass/fail) PASS PASS    Comment: Performed at [facility], [address], [place], [place] [address]  Blood gas, arterial     Status: Abnormal   Collection Time: 09/26/17  3:25 PM  Result Value Ref Range   FIO2 40.00    Delivery systems VENTILATOR    Mode CONTINUOUS POSITIVE AIRWAY PRESSURE    Peep/cpap 5.0 cm H20   Pressure support 13.0 cm H20   pH, Arterial 7.355 7.350 - 7.450   pCO2 arterial 52.5 (H) 32.0 - 48.0 mmHg   pO2, Arterial 87.3 83.0 - 108.0 mmHg   Bicarbonate 26.6 20.0 - 28.0 mmol/L   Acid-Base Excess 3.5 (H) 0.0 - 2.0 mmol/L   O2 Saturation 95.3 %   Patient temperature 37.0    Collection site RIGHT RADIAL    Drawn by [id number]    Sample type ARTERIAL DRAW    Allens test (pass/fail) PASS PASS    Comment: Performed at [facility], [address], [place], [place] [address]  Glucose, capillary     Status: Abnormal   Collection Time: 09/26/17  4:17 PM  Result Value Ref Range   Glucose-Capillary 194 (H) 65 - 99 mg/dL   Comment 1 Notify RN    Comment 2 Document in Chart   Triglycerides     Status: None   Collection Time: 09/26/17  5:33 PM  Result Value Ref Range   Triglycerides 61 <150 mg/dL    Comment: Performed at [facility], [address] [place], [place] [address]  Magnesium     Status: None   Collection Time: 09/26/17  5:33 PM  Result Value Ref Range   Magnesium 2.1 1.7 - 2.4 mg/dL    Comment: Performed at [facility], [address], [place], [place] [address]  Phosphorus  Status: Abnormal   Collection Time: 09/26/17  5:33 PM  Result Value Ref  Range   Phosphorus 5.0 (H) 2.5 - 4.6 mg/dL    Comment: Performed at [facility], [address], [place], [place] [address]  Glucose, capillary     Status: Abnormal   Collection Time: 09/26/17  8:07 PM  Result Value Ref Range   Glucose-Capillary 148 (H) 65 - 99 mg/dL  Glucose, capillary     Status: Abnormal   Collection Time: 09/27/17 12:12 AM  Result Value Ref Range   Glucose-Capillary 130 (H) 65 - 99 mg/dL   Comment 1 Notify RN    Comment 2 Document in Chart   Glucose, capillary     Status: Abnormal   Collection Time: 09/27/17  4:07 AM  Result Value Ref Range   Glucose-Capillary 132 (H) 65 - 99 mg/dL   Comment 1 Notify RN    Comment 2 Document in Chart   Magnesium     Status: None   Collection Time: 09/27/17  4:51 AM  Result Value Ref Range   Magnesium 2.1 1.7 - 2.4 mg/dL    Comment: Performed at [facility], [address], [place], [place] [address]  Phosphorus     Status: None   Collection Time: 09/27/17  4:51 AM  Result Value Ref Range   Phosphorus 3.5 2.5 - 4.6 mg/dL    Comment: Performed at [facility], [address], [place], [place] [address]  Glucose, capillary     Status: Abnormal   Collection Time: 09/27/17  8:18 AM  Result Value Ref Range   Glucose-Capillary 127 (H) 65 - 99 mg/dL    ABGS Recent Labs    09/26/17 1525  PHART 7.355  PO2ART 87.3  HCO3 26.6   CULTURES Recent Results (from the past 240 hour(s))  MRSA PCR Screening     Status: None   Collection Time: 09/24/17  6:28 PM  Result Value Ref Range Status   MRSA by PCR NEGATIVE NEGATIVE Final    Comment:        The GeneXpert MRSA Assay (FDA approved for NASAL specimens only), is one component of a comprehensive MRSA colonization surveillance program. It is not intended to diagnose MRSA infection nor to guide or monitor treatment for MRSA infections. Performed at [facility], [address], [place], [place] [address]    Studies/Results: Dg Chest 1 View  Result Date:  09/27/2017 CLINICAL DATA:  Respiratory failure. EXAM: CHEST 1 VIEW COMPARISON:  Chest x-ray from yesterday. FINDINGS: Interval removal of the endotracheal and enteric tubes. Stable borderline enlarged cardiac silhouette. Unchanged airspace disease in the right greater than left lung bases. Probable small bilateral layering pleural effusions. No pneumothorax. No acute osseous abnormality. IMPRESSION: 1. No significant interval change in right greater than left lung base airspace disease with probable small layering bilateral pleural effusions. Electronically Signed   By: Titus Dubin M.D.   On: 09/27/2017 09:11   Dg Chest 1 View  Result Date: 09/26/2017 CLINICAL DATA:  Respiratory failure intubated patient. EXAM: CHEST 1 VIEW COMPARISON:  Single-view of the chest 09/25/2017 and 09/23/2017. CT chest 09/23/2017. FINDINGS: ETT and NG tube are unchanged. The lungs are emphysematous. Airspace disease in the lung bases, worse on the right, is unchanged. Heart size is upper normal. No pneumothorax. IMPRESSION: Support tubes projecting good position. No change in right worse than left basilar airspace disease. Emphysema. Electronically Signed   By: Inge Rise M.D.   On: 09/26/2017 10:49  Medications:  Prior to Admission:  Medications Prior to Admission  Medication Sig Dispense Refill Last Dose  . amLODipine (NORVASC) 10 MG tablet Take 10 mg by mouth daily.   09/23/2017 at Unknown time  . benztropine (COGENTIN) 1 MG tablet Take 0.5 tablets (0.5 mg total) by mouth daily. 30 tablet 0 09/23/2017 at Unknown time  . loratadine (CLARITIN) 10 MG tablet Take 10 mg by mouth daily.   09/23/2017 at Unknown time  . LORazepam (ATIVAN) 1 MG tablet Take 1 tablet (1 mg total) by mouth every 4 (four) hours as needed for anxiety or sleep. (Patient taking differently: Take 1 mg by mouth 3 (three) times daily as needed for anxiety. ) 30 tablet 0 Past Week at Unknown time  . metoprolol tartrate (LOPRESSOR) 25 MG tablet Take  25 mg by mouth 2 (two) times daily.   09/23/2017 at 0730  . risperiDONE (RISPERDAL) 3 MG tablet Take 3 mg by mouth 2 (two) times daily.   09/23/2017 at Unknown time  . rivaroxaban (XARELTO) 20 MG TABS tablet Take 1 tablet (20 mg total) by mouth daily with supper. 30 tablet 0 09/22/2017 at Unknown time  . Skin Protectants, Misc. (EUCERIN) cream Apply topically daily. Tac/euc 0.1% 1:4 Apply sparingly to rash on lower legs   Past Week at Unknown time  . triamcinolone cream (KENALOG) 0.1 % Apply 1 application topically 2 (two) times daily.    Past Week at Unknown time   Scheduled: . benztropine  0.5 mg Oral Daily  . chlorhexidine gluconate (MEDLINE KIT)  15 mL Mouth Rinse BID  . enoxaparin (LOVENOX) injection  80 mg Subcutaneous Q12H  . feeding supplement (PRO-STAT SUGAR FREE 64)  30 mL Per Tube BID  . fentaNYL (SUBLIMAZE) injection  50 mcg Intravenous Once  . furosemide  40 mg Intravenous Once  . hydrocerin   Topical Daily  . ipratropium-albuterol  3 mL Nebulization Q6H WA  . mouth rinse  15 mL Mouth Rinse QID  . methylPREDNISolone (SOLU-MEDROL) injection  40 mg Intravenous Q12H  . metoprolol tartrate  2.5 mg Intravenous Q12H  . pantoprazole (PROTONIX) IV  40 mg Intravenous Daily  . risperiDONE  3 mg Oral BID  . sodium chloride flush  3 mL Intravenous Q12H  . triamcinolone cream  1 application Topical BID   Continuous: . sodium chloride    . ampicillin-sulbactam (UNASYN) IV 3 g (09/27/17 0629)  . dextrose 5 % and 0.9% NaCl 50 mL/hr at 09/27/17 0325  . fentaNYL infusion INTRAVENOUS Stopped (09/26/17 1600)  . propofol (DIPRIVAN) infusion Stopped (09/26/17 1100)   NFA:OZHYQM chloride, acetaminophen **OR** acetaminophen, fentaNYL, fentaNYL (SUBLIMAZE) injection, fentaNYL (SUBLIMAZE) injection, hydrALAZINE, ondansetron **OR** ondansetron (ZOFRAN) IV, sodium chloride flush  Assesment: She was admitted with acute metabolic encephalopathy likely from her very low sodium.  She had nausea and  vomiting and aspirated and has aspiration pneumonia.  She developed acute respiratory failure requiring intubation and mechanical ventilation but she was able to come off of the ventilator yesterday.  At baseline she has paranoid schizophrenia and she is requesting her medicines  Her hyponatremia is better  She is having trouble swallowing so I think she should probably be n.p.o. until we can clear her swallowing  Based on her smoking history I think she certainly has COPD as well and that is being treated with bronchodilators and steroids Principal Problem:   Acute metabolic encephalopathy Active Problems:   HTN (hypertension)   Hyponatremia   Pulmonary embolus ([place])   Paranoid schizophrenia (  [place])   Cardiomyopathy ([place])   Pulmonary hypertension ([place])   Acute respiratory failure ([place])    Plan: As above    LOS: 4 days   Orly Quimby L 09/27/2017, 9:16 AM

## 2017-09-28 LAB — BASIC METABOLIC PANEL
ANION GAP: 8 (ref 5–15)
BUN: 11 mg/dL (ref 6–20)
CALCIUM: 8.3 mg/dL — AB (ref 8.9–10.3)
CO2: 26 mmol/L (ref 22–32)
Chloride: 102 mmol/L (ref 101–111)
Creatinine, Ser: 0.42 mg/dL — ABNORMAL LOW (ref 0.44–1.00)
Glucose, Bld: 140 mg/dL — ABNORMAL HIGH (ref 65–99)
Potassium: 4.1 mmol/L (ref 3.5–5.1)
Sodium: 136 mmol/L (ref 135–145)

## 2017-09-28 LAB — CBC
HEMATOCRIT: 40.9 % (ref 36.0–46.0)
HEMOGLOBIN: 13 g/dL (ref 12.0–15.0)
MCH: 28.2 pg (ref 26.0–34.0)
MCHC: 31.8 g/dL (ref 30.0–36.0)
MCV: 88.7 fL (ref 78.0–100.0)
Platelets: 321 10*3/uL (ref 150–400)
RBC: 4.61 MIL/uL (ref 3.87–5.11)
RDW: 14.8 % (ref 11.5–15.5)
WBC: 8.7 10*3/uL (ref 4.0–10.5)

## 2017-09-28 LAB — GLUCOSE, CAPILLARY
GLUCOSE-CAPILLARY: 133 mg/dL — AB (ref 65–99)
GLUCOSE-CAPILLARY: 108 mg/dL — AB (ref 65–99)
GLUCOSE-CAPILLARY: 146 mg/dL — AB (ref 65–99)
Glucose-Capillary: 133 mg/dL — ABNORMAL HIGH (ref 65–99)
Glucose-Capillary: 122 mg/dL — ABNORMAL HIGH (ref 65–99)
Glucose-Capillary: 111 mg/dL — ABNORMAL HIGH (ref 65–99)

## 2017-09-28 LAB — TRIGLYCERIDES: TRIGLYCERIDES: 71 mg/dL (ref <150)

## 2017-09-28 MED ORDER — IPRATROPIUM-ALBUTEROL 0.5-2.5 (3) MG/3ML IN SOLN
3.0000 mL | Freq: Three times a day (TID) | RESPIRATORY_TRACT | Status: DC
  Administered 2017-09-29 – 2017-09-30 (×5): 3 mL via RESPIRATORY_TRACT
  Filled 2017-09-28 (×6): qty 3

## 2017-09-28 MED ORDER — AMLODIPINE BESYLATE 5 MG PO TABS
10.0000 mg | ORAL_TABLET | Freq: Every day | ORAL | Status: DC
  Administered 2017-09-28 – 2017-09-29 (×2): 10 mg via ORAL
  Filled 2017-09-28 (×3): qty 2

## 2017-09-28 MED ORDER — RIVAROXABAN 20 MG PO TABS
20.0000 mg | ORAL_TABLET | Freq: Every day | ORAL | Status: DC
  Administered 2017-09-28 – 2017-09-30 (×3): 20 mg via ORAL
  Filled 2017-09-28 (×3): qty 1

## 2017-09-28 MED ORDER — PANTOPRAZOLE SODIUM 40 MG PO TBEC
40.0000 mg | DELAYED_RELEASE_TABLET | Freq: Every day | ORAL | Status: DC
  Administered 2017-09-28 – 2017-09-30 (×3): 40 mg via ORAL
  Filled 2017-09-28 (×3): qty 1

## 2017-09-28 MED ORDER — METOPROLOL TARTRATE 50 MG PO TABS
50.0000 mg | ORAL_TABLET | Freq: Two times a day (BID) | ORAL | Status: DC
  Administered 2017-09-28 – 2017-09-30 (×5): 50 mg via ORAL
  Filled 2017-09-28 (×5): qty 1

## 2017-09-28 NOTE — Progress Notes (Signed)
Patient took her nasal canula and her oxygen saturation dropped to low 80s, HR went up to 140 within couple minutes. Patient was reminded to keep her nasal cannula on.

## 2017-09-28 NOTE — Progress Notes (Signed)
[facility] PROGRESS NOTE  Nancy Erickson GOT:[id number] DOB: [birth year] DOA: 09/23/2017 PCP: Aura Dials, MD  Interim summary and HPI 62 y.o. 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  Patient is currently unresponsive on BiPAP and careworkers at bedside state that she has been drinking an excessive amount of water recently.  She has been taking her medications as otherwise prescribed including her Xarelto.  Case further complicated for acute resp failure with hypoxia/hypercapnia and concerns for aspiration PNA. Mechanically ventilated and started on Unasyn.  Assessment/Plan: 1-acute encephalopathy: hypercapnic and metabolic (in the setting of hyponatremia) -continue to follow electrolytes and avoid over drinking (had hx of psychogenic polydipsia) -patient hyponatremic on admission. Last sodium 136; will recheck BMET in am -patient's CXR demonstrating emphysema; will continue treatment with steroids, pulmicort and duoneb. -X-ray on 2/22 no showing significant infiltrates but with positive emphysematous changes (most likely suggesting more pneumonitis than aspiration pneumonia) -Continue Unasyn, as intended to treat presumed infectious colitis. -patient extubated and overall protecting airways.   2-acute colitis: presume to be infectious -no fever, no abd pain -Continue Unasyn, will aim for a 7 days treatment (currently day 5/7) -Follow clinical response and continue supportive care. -continue dysphagia 3 diet.  3-paranoid schizophrenia  -mood is overall stable and most likely at baseline now -will continue risperdal and congentin   4-HTN -BP is fair -will resume norvasc and  continue lopressor -continue also PRN hydralazine.   5-hx of PE -No PE seen on CTA during this admission. -will transition back to xarelto  6-hypokalemia -Potassium repleted -Magnesium within normal limits -continue to follow electrolytes trend intermittently   7-abnormal findings on left breast -patient will need mammogram as an outpatient    Code Status: Full code Family Communication: no family at bedside  Disposition Plan: waiting for med-surg bed; continue current antibiotics; continue slowly tapering steroids and continue duoneb. Will wean O2 off as tolerated. continue dysphagia 3 and thin liquids.  Consultants:  PCC/critical care  Procedures:  See below for x-ray reports   ETT and Mechanical ventilation   Antibiotics:  unasyn 2/19  HPI/Subjective: No fever, oriented X2, in no major distress and reporting feeling better. No CP, no nausea, no vomiting.   Objective: Vitals:   09/28/17 0600 09/28/17 0848  BP: (!) 163/92   Pulse: 98   Resp: 17   Temp:  98.3 F (36.8 C)  SpO2: 94% 94%    Intake/Output Summary (Last 24 hours) at 09/28/2017 0947 Last data filed at 09/28/2017 0600 Gross per 24 hour  Intake 2318.83 ml  Output 5550 ml  Net -3231.17 ml   Filed Weights   09/26/17 0500 09/27/17 0400 09/28/17 0500  Weight: 74.9 kg (165 lb 2 oz) 73.5 kg (162 lb 0.6 oz) 75.1 kg (165 lb 9.1 oz)    Exam:   General: afebrile, no CP, reports breathing is ok and is just asking for her psych meds. Noticed to experience desaturation into mid-'s to low 80's without oxygen supplementation. No nausea, no vomiting and tolerating Dysphagia 3 diet.   Cardiovascular: S1 and S2, no rubs, no gallops  Respiratory: overall improved, with some SOB on exertion and positive tachypnea. Mild exp wheezing appreciated, no crackles; positive rhonchi.  Abdomen: soft, NT, ND, positive BS  Musculoskeletal: no edema, no  cyanosis   Data Reviewed: Basic Metabolic Panel: Recent Labs   Lab 09/23/17 1426 09/24/17 0540 09/25/17 0523 09/26/17 0439 09/26/17 0736 09/26/17 1733 09/27/17 0451 09/27/17 1711 09/28/17 0423  NA 116* 122* 133* 136  --   --   --   --  136  K 4.2 3.9 3.4* 3.8  --   --   --   --  4.1  CL 80* 88* 95* 100*  --   --   --   --  102  CO2 _0 --   --   --   --  26  GLUCOSE 108* 71 88 96  --   --   --   --  140*  BUN _1 --   --   --   --  11  CREATININE 0.34* 0.51 0.59 0.59  --   --   --   --  0.42*  CALCIUM 7.5* 7.1* 7.6* 7.4*  --   --   --   --  8.3*  MG  --   --   --  2.1  --  2.1 2.1 2.1  --   PHOS  --   --   --   --  4.4 5.0* 3.5 2.6  --    Liver Function Tests: Recent Labs  Lab 09/23/17 1040  AST 27  ALT 29  ALKPHOS 84  BILITOT 0.4  PROT 6.5  ALBUMIN 3.4*   CBC: Recent Labs  Lab 09/23/17 1040 09/23/17 1104 09/24/17 0540 09/25/17 0523 09/28/17 0423  WBC 12.6*  --  10.9* 7.5 8.7  NEUTROABS 10.8*  --   --   --   --   HGB 13.0 15.3* 11.8* 11.9* 13.0  HCT 39.1 45.0 36.6 37.4 40.9  MCV 86.9  --  87.1 87.0 88.7  PLT 310  --  265 336 321   CBG: Recent Labs  Lab 09/27/17 1745 09/27/17 2024 09/28/17 0121 09/28/17 0438 09/28/17 0824  GLUCAP 196* 141* 133* 133* 108*   Studies: Dg Chest 1 View  Result Date: 09/27/2017 CLINICAL DATA:  Respiratory failure. EXAM: CHEST 1 VIEW COMPARISON:  Chest x-ray from yesterday. FINDINGS: Interval removal of the endotracheal and enteric tubes. Stable borderline enlarged cardiac silhouette. Unchanged airspace disease in the right greater than left lung bases. Probable small bilateral layering pleural effusions. No pneumothorax. No acute osseous abnormality. IMPRESSION: 1. No significant interval change in right greater than left lung base airspace disease with probable small layering bilateral pleural effusions. Electronically Signed   By: Titus Dubin M.D.   On: 09/27/2017 09:11   Dg Chest 1 View  Result Date: 09/26/2017 CLINICAL DATA:  Respiratory failure intubated patient.  EXAM: CHEST 1 VIEW COMPARISON:  Single-view of the chest 09/25/2017 and 09/23/2017. CT chest 09/23/2017. FINDINGS: ETT and NG tube are unchanged. The lungs are emphysematous. Airspace disease in the lung bases, worse on the right, is unchanged. Heart size is upper normal. No pneumothorax. IMPRESSION: Support tubes projecting good position. No change in right worse than left basilar airspace disease. Emphysema. Electronically Signed   By: Inge Rise M.D.   On: 09/26/2017 10:49    Scheduled Meds: . amLODipine  10 mg Oral Daily  . benztropine  0.5 mg Oral Daily  . chlorhexidine gluconate (MEDLINE KIT)  15 mL Mouth Rinse BID  . feeding supplement (PRO-STAT SUGAR FREE 64)  30 mL Per Tube BID  . fentaNYL (SUBLIMAZE) injection  50 mcg Intravenous  Once  . furosemide  40 mg Intravenous Once  . hydrocerin   Topical Daily  . ipratropium-albuterol  3 mL Nebulization Q6H WA  . mouth rinse  15 mL Mouth Rinse QID  . methylPREDNISolone (SOLU-MEDROL) injection  40 mg Intravenous Q12H  . metoprolol tartrate  50 mg Oral BID  . pantoprazole  40 mg Oral Daily  . risperiDONE  3 mg Oral BID  . rivaroxaban  20 mg Oral Q supper  . sodium chloride flush  3 mL Intravenous Q12H  . triamcinolone cream  1 application Topical BID   Continuous Infusions: . sodium chloride    . ampicillin-sulbactam (UNASYN) IV 3 g (09/28/17 0020)    Time spent: 30 minutes    [facility] Pager [phone]. If 7PM-7AM, please contact night-coverage at [email or web address], password [facility] 09/28/2017, 9:47 AM  LOS: 5 days

## 2017-09-28 NOTE — Progress Notes (Signed)
Subjective: She has no complaints.  She looks comfortable.  She was admitted with hyponatremia nausea and vomiting aspiration pneumonia and acute respiratory failure requiring intubation and mechanical ventilation.  She was extubated 48 hours ago and has done well.  Her situation is complicated by paranoid schizophrenia.  Objective: Vital signs in last 24 hours: Temp:  [98.3 F (36.8 C)-99 F (37.2 C)] 98.3 F (36.8 C) (02/24 0848) Pulse Rate:  [78-147] 98 (02/24 0600) Resp:  [17-29] 17 (02/24 0600) BP: (148-163)/(74-102) 163/92 (02/24 0600) SpO2:  [90 %-96 %] 94 % (02/24 0848) Weight:  [75.1 kg (165 lb 9.1 oz)] 75.1 kg (165 lb 9.1 oz) (02/24 0500) Weight change: 1.6 kg (3 lb 8.4 oz) Last BM Date: (Pt does not know)  Intake/Output from previous day: 02/23 0701 - 02/24 0700 In: 2318.8 [P.O.:1140; I.V.:1178.8] Out: 5550 [Urine:5550]  PHYSICAL EXAM General appearance: alert and cooperative Resp: rhonchi bilaterally Cardio: regular rate and rhythm, S1, S2 normal, no murmur, click, rub or gallop GI: soft, non-tender; bowel sounds normal; no masses,  no organomegaly Extremities: extremities normal, atraumatic, no cyanosis or edema  Lab Results:  Results for orders placed or performed during the hospital encounter of 09/23/17 (from the past 48 hour(s))  Glucose, capillary     Status: Abnormal   Collection Time: 09/26/17 12:01 PM  Result Value Ref Range   Glucose-Capillary 152 (H) 65 - 99 mg/dL   Comment 1 Notify RN    Comment 2 Document in Chart   Blood gas, arterial     Status: Abnormal   Collection Time: 09/26/17 12:40 PM  Result Value Ref Range   FIO2 40.00    Delivery systems VENTILATOR    Mode CONTINUOUS POSITIVE AIRWAY PRESSURE    Peep/cpap 5.0 cm H20   Pressure support 13 cm H20   pH, Arterial 7.343 (L) 7.350 - 7.450   pCO2 arterial 51.5 (H) 32.0 - 48.0 mmHg   pO2, Arterial 89.5 83.0 - 108.0 mmHg   Bicarbonate 25.4 20.0 - 28.0 mmol/L   Acid-Base Excess 2.1 (H) 0.0 -  2.0 mmol/L   O2 Saturation 95.2 %   Patient temperature 36.7    Collection site RIGHT RADIAL    Drawn by [id number]    Sample type ARTERIAL DRAW    Allens test (pass/fail) PASS PASS    Comment: Performed at [facility], [address], [place], [place] [address]  Blood gas, arterial     Status: Abnormal   Collection Time: 09/26/17  3:25 PM  Result Value Ref Range   FIO2 40.00    Delivery systems VENTILATOR    Mode CONTINUOUS POSITIVE AIRWAY PRESSURE    Peep/cpap 5.0 cm H20   Pressure support 13.0 cm H20   pH, Arterial 7.355 7.350 - 7.450   pCO2 arterial 52.5 (H) 32.0 - 48.0 mmHg   pO2, Arterial 87.3 83.0 - 108.0 mmHg   Bicarbonate 26.6 20.0 - 28.0 mmol/L   Acid-Base Excess 3.5 (H) 0.0 - 2.0 mmol/L   O2 Saturation 95.3 %   Patient temperature 37.0    Collection site RIGHT RADIAL    Drawn by [id number]    Sample type ARTERIAL DRAW    Allens test (pass/fail) PASS PASS    Comment: Performed at [facility], [address], [place], [place] [address]  Glucose, capillary     Status: Abnormal   Collection Time: 09/26/17  4:17 PM  Result Value Ref Range   Glucose-Capillary 194 (H) 65 - 99 mg/dL   Comment 1  Notify RN    Comment 2 Document in Chart   Triglycerides     Status: None   Collection Time: 09/26/17  5:33 PM  Result Value Ref Range   Triglycerides 61 <150 mg/dL    Comment: Performed at [facility], [address], [place], [place] [address]  Magnesium     Status: None   Collection Time: 09/26/17  5:33 PM  Result Value Ref Range   Magnesium 2.1 1.7 - 2.4 mg/dL    Comment: Performed at [facility], [address], [place], [place] [address]  Phosphorus     Status: Abnormal   Collection Time: 09/26/17  5:33 PM  Result Value Ref Range   Phosphorus 5.0 (H) 2.5 - 4.6 mg/dL    Comment: Performed at [facility], [address], [place], [place] [address]  Glucose, capillary     Status: Abnormal   Collection Time: 09/26/17  8:07 PM  Result Value Ref Range   Glucose-Capillary 148 (H)  65 - 99 mg/dL  Glucose, capillary     Status: Abnormal   Collection Time: 09/27/17 12:12 AM  Result Value Ref Range   Glucose-Capillary 130 (H) 65 - 99 mg/dL   Comment 1 Notify RN    Comment 2 Document in Chart   Glucose, capillary     Status: Abnormal   Collection Time: 09/27/17  4:07 AM  Result Value Ref Range   Glucose-Capillary 132 (H) 65 - 99 mg/dL   Comment 1 Notify RN    Comment 2 Document in Chart   Magnesium     Status: None   Collection Time: 09/27/17  4:51 AM  Result Value Ref Range   Magnesium 2.1 1.7 - 2.4 mg/dL    Comment: Performed at [facility], [address], [place], [place] [address]  Phosphorus     Status: None   Collection Time: 09/27/17  4:51 AM  Result Value Ref Range   Phosphorus 3.5 2.5 - 4.6 mg/dL    Comment: Performed at [facility], [address], [place], [place] [address]  Glucose, capillary     Status: Abnormal   Collection Time: 09/27/17  8:18 AM  Result Value Ref Range   Glucose-Capillary 127 (H) 65 - 99 mg/dL  Glucose, capillary     Status: Abnormal   Collection Time: 09/27/17 11:10 AM  Result Value Ref Range   Glucose-Capillary 127 (H) 65 - 99 mg/dL  Magnesium     Status: None   Collection Time: 09/27/17  5:11 PM  Result Value Ref Range   Magnesium 2.1 1.7 - 2.4 mg/dL    Comment: Performed at [facility], [address] [place] [address], [place], [place] [address]  Phosphorus     Status: None   Collection Time: 09/27/17  5:11 PM  Result Value Ref Range   Phosphorus 2.6 2.5 - 4.6 mg/dL    Comment: Performed at [facility], [address], [place], [place] [address]  Glucose, capillary     Status: Abnormal   Collection Time: 09/27/17  5:45 PM  Result Value Ref Range   Glucose-Capillary 196 (H) 65 - 99 mg/dL  Glucose, capillary     Status: Abnormal   Collection Time: 09/27/17  8:24 PM  Result Value Ref Range   Glucose-Capillary 141 (H) 65 - 99 mg/dL  Glucose, capillary     Status: Abnormal   Collection Time: 09/28/17  1:21 AM  Result Value Ref  Range   Glucose-Capillary 133 (H) 65 - 99 mg/dL  Basic metabolic panel  Status: Abnormal   Collection Time: 09/28/17  4:23 AM  Result Value Ref Range   Sodium 136 135 - 145 mmol/L   Potassium 4.1 3.5 - 5.1 mmol/L   Chloride 102 101 - 111 mmol/L   CO2 26 22 - 32 mmol/L   Glucose, Bld 140 (H) 65 - 99 mg/dL   BUN 11 6 - 20 mg/dL   Creatinine, Ser 0.42 (L) 0.44 - 1.00 mg/dL   Calcium 8.3 (L) 8.9 - 10.3 mg/dL   GFR calc non Af Amer >60 >60 mL/min   GFR calc Af Amer >60 >60 mL/min    Comment: (NOTE) The eGFR has been calculated using the CKD EPI equation. This calculation has not been validated in all clinical situations. eGFR's persistently <60 mL/min signify possible Chronic Kidney Disease.    Anion gap 8 5 - 15    Comment: Performed at [facility], [address] [place], [place] [address]  CBC     Status: None   Collection Time: 09/28/17  4:23 AM  Result Value Ref Range   WBC 8.7 4.0 - 10.5 K/uL   RBC 4.61 3.87 - 5.11 MIL/uL   Hemoglobin 13.0 12.0 - 15.0 g/dL   HCT 40.9 36.0 - 46.0 %   MCV 88.7 78.0 - 100.0 fL   MCH 28.2 26.0 - 34.0 pg   MCHC 31.8 30.0 - 36.0 g/dL   RDW 14.8 11.5 - 15.5 %   Platelets 321 150 - 400 K/uL    Comment: Performed at [facility], [address], [place], [place] [address]  Triglycerides     Status: None   Collection Time: 09/28/17  4:23 AM  Result Value Ref Range   Triglycerides 71 <150 mg/dL    Comment: Performed at [facility], [address] [place], [place] [address]  Glucose, capillary     Status: Abnormal   Collection Time: 09/28/17  4:38 AM  Result Value Ref Range   Glucose-Capillary 133 (H) 65 - 99 mg/dL  Glucose, capillary     Status: Abnormal   Collection Time: 09/28/17  8:24 AM  Result Value Ref Range   Glucose-Capillary 108 (H) 65 - 99 mg/dL    ABGS Recent Labs    09/26/17 1525  PHART 7.355  PO2ART 87.3  HCO3 26.6   CULTURES Recent Results (from the past 240 hour(s))  MRSA PCR Screening     Status: None    Collection Time: 09/24/17  6:28 PM  Result Value Ref Range Status   MRSA by PCR NEGATIVE NEGATIVE Final    Comment:        The GeneXpert MRSA Assay (FDA approved for NASAL specimens only), is one component of a comprehensive MRSA colonization surveillance program. It is not intended to diagnose MRSA infection nor to guide or monitor treatment for MRSA infections. Performed at [facility], [address], [place], [place] [address]    Studies/Results: Dg Chest 1 View  Result Date: 09/27/2017 CLINICAL DATA:  Respiratory failure. EXAM: CHEST 1 VIEW COMPARISON:  Chest x-ray from yesterday. FINDINGS: Interval removal of the endotracheal and enteric tubes. Stable borderline enlarged cardiac silhouette. Unchanged airspace disease in the right greater than left lung bases. Probable small bilateral layering pleural effusions. No pneumothorax. No acute osseous abnormality. IMPRESSION: 1. No significant interval change in right greater than left lung base airspace disease with probable small layering bilateral pleural effusions. Electronically Signed   By: Titus Dubin M.D.   On: 09/27/2017 09:11   Dg Chest 1  View  Result Date: 09/26/2017 CLINICAL DATA:  Respiratory failure intubated patient. EXAM: CHEST 1 VIEW COMPARISON:  Single-view of the chest 09/25/2017 and 09/23/2017. CT chest 09/23/2017. FINDINGS: ETT and NG tube are unchanged. The lungs are emphysematous. Airspace disease in the lung bases, worse on the right, is unchanged. Heart size is upper normal. No pneumothorax. IMPRESSION: Support tubes projecting good position. No change in right worse than left basilar airspace disease. Emphysema. Electronically Signed   By: Inge Rise M.D.   On: 09/26/2017 10:49    Medications:  Prior to Admission:  Medications Prior to Admission  Medication Sig Dispense Refill Last Dose  . amLODipine (NORVASC) 10 MG tablet Take 10 mg by mouth daily.   09/23/2017 at Unknown time  . benztropine  (COGENTIN) 1 MG tablet Take 0.5 tablets (0.5 mg total) by mouth daily. 30 tablet 0 09/23/2017 at Unknown time  . loratadine (CLARITIN) 10 MG tablet Take 10 mg by mouth daily.   09/23/2017 at Unknown time  . LORazepam (ATIVAN) 1 MG tablet Take 1 tablet (1 mg total) by mouth every 4 (four) hours as needed for anxiety or sleep. (Patient taking differently: Take 1 mg by mouth 3 (three) times daily as needed for anxiety. ) 30 tablet 0 Past Week at Unknown time  . metoprolol tartrate (LOPRESSOR) 25 MG tablet Take 25 mg by mouth 2 (two) times daily.   09/23/2017 at 0730  . risperiDONE (RISPERDAL) 3 MG tablet Take 3 mg by mouth 2 (two) times daily.   09/23/2017 at Unknown time  . rivaroxaban (XARELTO) 20 MG TABS tablet Take 1 tablet (20 mg total) by mouth daily with supper. 30 tablet 0 09/22/2017 at Unknown time  . Skin Protectants, Misc. (EUCERIN) cream Apply topically daily. Tac/euc 0.1% 1:4 Apply sparingly to rash on lower legs   Past Week at Unknown time  . triamcinolone cream (KENALOG) 0.1 % Apply 1 application topically 2 (two) times daily.    Past Week at Unknown time   Scheduled: . amLODipine  10 mg Oral Daily  . benztropine  0.5 mg Oral Daily  . chlorhexidine gluconate (MEDLINE KIT)  15 mL Mouth Rinse BID  . feeding supplement (PRO-STAT SUGAR FREE 64)  30 mL Per Tube BID  . fentaNYL (SUBLIMAZE) injection  50 mcg Intravenous Once  . furosemide  40 mg Intravenous Once  . hydrocerin   Topical Daily  . ipratropium-albuterol  3 mL Nebulization Q6H WA  . mouth rinse  15 mL Mouth Rinse QID  . methylPREDNISolone (SOLU-MEDROL) injection  40 mg Intravenous Q12H  . metoprolol tartrate  50 mg Oral BID  . pantoprazole  40 mg Oral Daily  . risperiDONE  3 mg Oral BID  . rivaroxaban  20 mg Oral Q supper  . sodium chloride flush  3 mL Intravenous Q12H  . triamcinolone cream  1 application Topical BID   Continuous: . sodium chloride    . ampicillin-sulbactam (UNASYN) IV 3 g (09/28/17 0020)   HDQ:QIWLNL  chloride, acetaminophen **OR** acetaminophen, hydrALAZINE, ondansetron **OR** ondansetron (ZOFRAN) IV, sodium chloride flush  Assesment: She was admitted with acute metabolic encephalopathy likely related to her low sodium level.  She was vomiting and aspirated.  She has acute respiratory failure on the basis of her encephalopathy her aspiration and baseline COPD/emphysema.  She was able to be extubated after about 48 hours on the ventilator and she is been off now for 48 hours.  She has some swallowing difficulty and is on a dysphagia 3  diet.  She has paranoid schizophrenia and is somewhat confused Principal Problem:   Acute metabolic encephalopathy Active Problems:   HTN (hypertension)   Hyponatremia   Pulmonary embolus (HCC)   Paranoid schizophrenia ([place])   Cardiomyopathy ([place])   Pulmonary hypertension ([place])   Acute respiratory failure ([place])    Plan: Continue treatments.  She probably would benefit from some maintenance treatment for her COPD when she goes back to her assisted living facility    LOS: 5 days   Kehinde Bowdish L 09/28/2017, 9:51 AM

## 2017-09-28 NOTE — Progress Notes (Signed)
ANTICOAGULATION CONSULT NOTE   Pharmacy Consult for lovenox >> switch back to Xarelto (home med) Indication: pulmonary embolus  Allergies  Allergen Reactions  . Phosphate Nausea And Vomiting  . Artane [Trihexyphenidyl] Other (See Comments)    unknown  . Codeine Other (See Comments)    unknown   Patient Measurements: Height: 5\' 4"  (162.6 cm) Weight: 165 lb 9.1 oz (75.1 kg) IBW/kg (Calculated) : 54.7 Dosing Weight: 63 kg  Vital Signs: Temp: 98.3 F (36.8 C) (02/24 0848) Temp Source: Oral (02/24 0848) BP: 163/92 (02/24 0600) Pulse Rate: 98 (02/24 0600)  Labs: Recent Labs    09/26/17 0439 09/28/17 0423  HGB  --  13.0  HCT  --  40.9  PLT  --  321  CREATININE 0.59 0.42*   Estimated Creatinine Clearance: 75.2 mL/min (A) (by C-G formula based on SCr of 0.42 mg/dL (L)).  Medical History: Past Medical History:  Diagnosis Date  . Hypertension   . Hyponatremia   . Paranoid schizophrenia (HCC)   . Pulmonary embolus (HCC)   . Venous insufficiency of right lower extremity    Medications:  Was on xarelto PTA  Assessment: 62 yo lady to start lovenox for h/o PE while she is NPO.  Now asked to switch back to Xarelto  Goal of Therapy:  Anti-Xa level 0.6-1 units/ml 4hrs after LMWH dose given Monitor platelets by anticoagulation protocol: Yes   Plan:  Xarelto 20mg  po daily w/ supper (home dose) CBC q MWF  Margo AyeHall, Kaziah Krizek A 09/28/2017,9:35 AM

## 2017-09-29 ENCOUNTER — Encounter (HOSPITAL_COMMUNITY): Payer: Self-pay | Admitting: *Deleted

## 2017-09-29 MED ORDER — AMLODIPINE BESYLATE 5 MG PO TABS
5.0000 mg | ORAL_TABLET | Freq: Every day | ORAL | Status: DC
  Administered 2017-09-29 – 2017-09-30 (×2): 5 mg via ORAL
  Filled 2017-09-29 (×2): qty 1

## 2017-09-29 MED ORDER — AMOXICILLIN-POT CLAVULANATE 500-125 MG PO TABS
1.0000 | ORAL_TABLET | Freq: Three times a day (TID) | ORAL | Status: DC
  Administered 2017-09-29 – 2017-09-30 (×4): 500 mg via ORAL
  Filled 2017-09-29 (×11): qty 1

## 2017-09-29 MED ORDER — LORATADINE 10 MG PO TABS
10.0000 mg | ORAL_TABLET | Freq: Every day | ORAL | Status: DC
  Administered 2017-09-29 – 2017-09-30 (×2): 10 mg via ORAL
  Filled 2017-09-29 (×2): qty 1

## 2017-09-29 NOTE — Plan of Care (Signed)
  Acute Rehab PT Goals(only PT should resolve) Pt Will Go Supine/Side To Sit 09/29/2017 1348 - Progressing by Ocie BobWatkins, Nan Maya, PT Flowsheets Taken 09/29/2017 1348  Pt will go Supine/Side to Sit with modified independence Patient Will Transfer Sit To/From Stand 09/29/2017 1348 - Progressing by Ocie BobWatkins, Gissell Barra, PT Flowsheets Taken 09/29/2017 1348  Patient will transfer sit to/from stand with supervision Pt Will Transfer Bed To Chair/Chair To Bed 09/29/2017 1348 - Progressing by Ocie BobWatkins, Annie Roseboom, PT Flowsheets Taken 09/29/2017 1348  Pt will Transfer Bed to Chair/Chair to Bed with supervision Pt Will Ambulate 09/29/2017 1348 - Progressing by Ocie BobWatkins, Shepard Keltz, PT Flowsheets Taken 09/29/2017 1348  Pt will Ambulate 50 feet;with supervision;with cane   1:49 PM, 09/29/17 Ocie BobJames Lamel Mccarley, MPT Physical Therapist with [facility] [phone] office [phone] mobile phone

## 2017-09-29 NOTE — Progress Notes (Signed)
Subjective: She says she feels okay.  She is not short of breath.  She has no new complaints.  She is on nasal oxygen.  Objective: Vital signs in last 24 hours: Temp:  [98 F (36.7 C)-99.8 F (37.7 C)] 98 F (36.7 C) (02/25 0400) Pulse Rate:  [94-112] 95 (02/24 1800) Resp:  [19-30] 19 (02/24 1800) BP: (140-163)/(76-97) 142/88 (02/24 1800) SpO2:  [83 %-95 %] 95 % (02/24 2132) Weight:  [71.7 kg (158 lb 1.1 oz)] 71.7 kg (158 lb 1.1 oz) (02/25 0500) Weight change: -3.4 kg (-7.9 oz) Last BM Date: (Pt does not know)  Intake/Output from previous day: 02/24 0701 - 02/25 0700 In: 1505 [P.O.:1502; I.V.:3] Out: 4050 [Urine:4050]  PHYSICAL EXAM General appearance: alert, cooperative and no distress Resp: clear to auscultation bilaterally Cardio: regular rate and rhythm, S1, S2 normal, no murmur, click, rub or gallop GI: soft, non-tender; bowel sounds normal; no masses,  no organomegaly Extremities: extremities normal, atraumatic, no cyanosis or edema  Lab Results:  Results for orders placed or performed during the hospital encounter of 09/23/17 (from the past 48 hour(s))  Glucose, capillary     Status: Abnormal   Collection Time: 09/27/17  8:18 AM  Result Value Ref Range   Glucose-Capillary 127 (H) 65 - 99 mg/dL  Glucose, capillary     Status: Abnormal   Collection Time: 09/27/17 11:10 AM  Result Value Ref Range   Glucose-Capillary 127 (H) 65 - 99 mg/dL  Magnesium     Status: None   Collection Time: 09/27/17  5:11 PM  Result Value Ref Range   Magnesium 2.1 1.7 - 2.4 mg/dL    Comment: Performed at [facility], [address] [place], [place] [address]  Phosphorus     Status: None   Collection Time: 09/27/17  5:11 PM  Result Value Ref Range   Phosphorus 2.6 2.5 - 4.6 mg/dL    Comment: Performed at [facility], [address], [place],  [address]  Glucose, capillary     Status: Abnormal   Collection Time: 09/27/17  5:45 PM  Result Value Ref Range   Glucose-Capillary 196  (H) 65 - 99 mg/dL  Glucose, capillary     Status: Abnormal   Collection Time: 09/27/17  8:24 PM  Result Value Ref Range   Glucose-Capillary 141 (H) 65 - 99 mg/dL  Glucose, capillary     Status: Abnormal   Collection Time: 09/28/17  1:21 AM  Result Value Ref Range   Glucose-Capillary 133 (H) 65 - 99 mg/dL  Basic metabolic panel     Status: Abnormal   Collection Time: 09/28/17  4:23 AM  Result Value Ref Range   Sodium 136 135 - 145 mmol/L   Potassium 4.1 3.5 - 5.1 mmol/L   Chloride 102 101 - 111 mmol/L   CO2 26 22 - 32 mmol/L   Glucose, Bld 140 (H) 65 - 99 mg/dL   BUN 11 6 - 20 mg/dL   Creatinine, Ser 0.42 (L) 0.44 - 1.00 mg/dL   Calcium 8.3 (L) 8.9 - 10.3 mg/dL   GFR calc non Af Amer >60 >60 mL/min   GFR calc Af Amer >60 >60 mL/min    Comment: (NOTE) The eGFR has been calculated using the CKD EPI equation. This calculation has not been validated in all clinical situations. eGFR's persistently <60 mL/min signify possible Chronic Kidney Disease.    Anion gap 8 5 - 15    Comment: Performed at [facility], [address], [place],  [place] [address]  CBC     Status: None   Collection Time: 09/28/17  4:23 AM  Result Value Ref Range   WBC 8.7 4.0 - 10.5 K/uL   RBC 4.61 3.87 - 5.11 MIL/uL   Hemoglobin 13.0 12.0 - 15.0 g/dL   HCT 40.9 36.0 - 46.0 %   MCV 88.7 78.0 - 100.0 fL   MCH 28.2 26.0 - 34.0 pg   MCHC 31.8 30.0 - 36.0 g/dL   RDW 14.8 11.5 - 15.5 %   Platelets 321 150 - 400 K/uL    Comment: Performed at [facility], [address], [place], [place] [address]  Triglycerides     Status: None   Collection Time: 09/28/17  4:23 AM  Result Value Ref Range   Triglycerides 71 <150 mg/dL    Comment: Performed at [facility], [address] [place], [place] [address]  Glucose, capillary     Status: Abnormal   Collection Time: 09/28/17  4:38 AM  Result Value Ref Range   Glucose-Capillary 133 (H) 65 - 99 mg/dL  Glucose, capillary     Status: Abnormal   Collection Time: 09/28/17   8:24 AM  Result Value Ref Range   Glucose-Capillary 108 (H) 65 - 99 mg/dL  Glucose, capillary     Status: Abnormal   Collection Time: 09/28/17 12:08 PM  Result Value Ref Range   Glucose-Capillary 146 (H) 65 - 99 mg/dL  Glucose, capillary     Status: Abnormal   Collection Time: 09/28/17  4:46 PM  Result Value Ref Range   Glucose-Capillary 122 (H) 65 - 99 mg/dL  Glucose, capillary     Status: Abnormal   Collection Time: 09/28/17  7:37 PM  Result Value Ref Range   Glucose-Capillary 111 (H) 65 - 99 mg/dL    ABGS Recent Labs    09/26/17 1525  PHART 7.355  PO2ART 87.3  HCO3 26.6   CULTURES Recent Results (from the past 240 hour(s))  MRSA PCR Screening     Status: None   Collection Time: 09/24/17  6:28 PM  Result Value Ref Range Status   MRSA by PCR NEGATIVE NEGATIVE Final    Comment:        The GeneXpert MRSA Assay (FDA approved for NASAL specimens only), is one component of a comprehensive MRSA colonization surveillance program. It is not intended to diagnose MRSA infection nor to guide or monitor treatment for MRSA infections. Performed at [facility], [address], [place], [place] [address]    Studies/Results: Dg Chest 1 View  Result Date: 09/27/2017 CLINICAL DATA:  Respiratory failure. EXAM: CHEST 1 VIEW COMPARISON:  Chest x-ray from yesterday. FINDINGS: Interval removal of the endotracheal and enteric tubes. Stable borderline enlarged cardiac silhouette. Unchanged airspace disease in the right greater than left lung bases. Probable small bilateral layering pleural effusions. No pneumothorax. No acute osseous abnormality. IMPRESSION: 1. No significant interval change in right greater than left lung base airspace disease with probable small layering bilateral pleural effusions. Electronically Signed   By: Titus Dubin M.D.   On: 09/27/2017 09:11    Medications:  Prior to Admission:  Medications Prior to Admission  Medication Sig Dispense Refill Last Dose  .  amLODipine (NORVASC) 10 MG tablet Take 10 mg by mouth daily.   09/23/2017 at Unknown time  . benztropine (COGENTIN) 1 MG tablet Take 0.5 tablets (0.5 mg total) by mouth daily. 30 tablet 0 09/23/2017 at Unknown time  . loratadine (CLARITIN) 10 MG tablet Take 10  mg by mouth daily.   09/23/2017 at Unknown time  . LORazepam (ATIVAN) 1 MG tablet Take 1 tablet (1 mg total) by mouth every 4 (four) hours as needed for anxiety or sleep. (Patient taking differently: Take 1 mg by mouth 3 (three) times daily as needed for anxiety. ) 30 tablet 0 Past Week at Unknown time  . metoprolol tartrate (LOPRESSOR) 25 MG tablet Take 25 mg by mouth 2 (two) times daily.   09/23/2017 at 0730  . risperiDONE (RISPERDAL) 3 MG tablet Take 3 mg by mouth 2 (two) times daily.   09/23/2017 at Unknown time  . rivaroxaban (XARELTO) 20 MG TABS tablet Take 1 tablet (20 mg total) by mouth daily with supper. 30 tablet 0 09/22/2017 at Unknown time  . Skin Protectants, Misc. (EUCERIN) cream Apply topically daily. Tac/euc 0.1% 1:4 Apply sparingly to rash on lower legs   Past Week at Unknown time  . triamcinolone cream (KENALOG) 0.1 % Apply 1 application topically 2 (two) times daily.    Past Week at Unknown time   Scheduled: . amLODipine  10 mg Oral Daily  . benztropine  0.5 mg Oral Daily  . chlorhexidine gluconate (MEDLINE KIT)  15 mL Mouth Rinse BID  . feeding supplement (PRO-STAT SUGAR FREE 64)  30 mL Per Tube BID  . fentaNYL (SUBLIMAZE) injection  50 mcg Intravenous Once  . furosemide  40 mg Intravenous Once  . hydrocerin   Topical Daily  . ipratropium-albuterol  3 mL Nebulization TID  . mouth rinse  15 mL Mouth Rinse QID  . methylPREDNISolone (SOLU-MEDROL) injection  40 mg Intravenous Q12H  . metoprolol tartrate  50 mg Oral BID  . pantoprazole  40 mg Oral Daily  . risperiDONE  3 mg Oral BID  . rivaroxaban  20 mg Oral Q supper  . sodium chloride flush  3 mL Intravenous Q12H  . triamcinolone cream  1 application Topical BID    Continuous: . sodium chloride    . ampicillin-sulbactam (UNASYN) IV 3 g (09/29/17 0540)   YIF:OYDXAJ chloride, acetaminophen **OR** acetaminophen, hydrALAZINE, ondansetron **OR** ondansetron (ZOFRAN) IV, sodium chloride flush  Assesment: She was admitted with acute metabolic encephalopathy probably on the basis of hyponatremia.  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  She also had nausea and vomiting seem to have aspirated and developed acute respiratory failure requiring intubation and mechanical ventilation.  She has COPD at baseline.  She is substantially improved.  Her sodium level has returned to normal.  She was able to be extubated 72 hours ago.  She is not having significant shortness of breath but she is not very active  She is being treated for aspiration pneumonia with Unasyn and that is also being used to treat colitis seen on CT of the abdomen  She has emphysema at baseline  She has paranoid schizophrenia which complicates her situation Principal Problem:   Acute metabolic encephalopathy Active Problems:   HTN (hypertension)   Hyponatremia   Pulmonary embolus ([place])   Paranoid schizophrenia ([place])   Cardiomyopathy ([place])   Pulmonary hypertension ([place])   Acute respiratory failure ([place])    Plan: She will continue treatments.  I will plan to follow more peripherally.  She would probably benefit from something like Spiriva at discharge for her emphysema    LOS: 6 days   Jimia Gentles L 09/29/2017, 6:51 AM

## 2017-09-29 NOTE — Care Management Note (Addendum)
Case Management Note  Patient Details  Name: Rush LandmarkMarilyn Mccolm MRN: [id number] Date of Birth: [birth year]  Subjective/Objective:    Adm with acute metabolic encephalopathy. [facility]. Reports she walks with R[facility]. [facility] She is [facility]. Called Group home to determine which [facility] they prefer. No answer.  She is acutely on oxygen. Will need home O2 assessment. Ordered.                 Action/Plan: DC back to group home with HH and presumably oxygen.   Expected Discharge Date:    09/30/2017              Expected Discharge Plan:  Home w Home Health Services  In-House Referral:     Discharge planning Services  CM Consult  Post Acute Care Choice:  Durable Medical Equipment, Home Health Choice offered to:  Patient  DME Arranged:    DME Agency:     HH Arranged:  PT HH Agency:     Status of Service:  In process, will continue to follow  If discussed at Long Length of Stay Meetings, dates discussed:    Additional Comments:  Makell Drohan, Chrystine OilerSharley Diane, RN 09/29/2017, 3:04 PM

## 2017-09-29 NOTE — Care Management Important Message (Signed)
Important Message  Patient Details  Name: Nancy Erickson MRN: [id number] Date of Birth: 1956-03-13   Medicare Important Message Given:  Yes    Shayaan Parke, Chrystine OilerSharley Diane, RN 09/29/2017, 3:06 PM

## 2017-09-29 NOTE — Evaluation (Signed)
Physical Therapy Evaluation Patient Details Name: Nancy Erickson MRN: [id number] DOB: Jan 04, 1956 Today's Date: 09/29/2017   History of Present Illness  Nancy Erickson is a 62 y.o. 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  Patient is currently unresponsive on BiPAP and careworkers at bedside state that she has been drinking an excessive amount of water recently.  She has been taking her medications as otherwise prescribed including her Xarelto.    Clinical Impression  Patient put on 2 LPM to complete BLE ROM exercises at bedside with O2 sats dropping to 87%, had patient on 4 LPM during gait training with O2 sats dropping to 87% before recovering to 91% after sitting for 2-3 minutes.  Patient mostly limited for walking due to c/o SOB and fatiguing after exertion.  Patient will benefit from continued physical therapy in hospital and recommended venue below to increase strength, balance, endurance for safe ADLs and gait.    Follow Up Recommendations Home health PT;Supervision for mobility/OOB    Equipment Recommendations  None recommended by PT    Recommendations for Other Services       Precautions / Restrictions Precautions Precautions: Fall Restrictions Weight Bearing Restrictions: No      Mobility  Bed Mobility Overal bed mobility: Needs Assistance Bed Mobility: Supine to Sit;Sit to Supine     Supine to sit: Supervision Sit to supine: Supervision      Transfers Overall transfer level: Needs assistance Equipment used: Straight cane Transfers: Sit to/from Stand;Stand Pivot Transfers Sit to Stand: Min guard Stand pivot transfers: Min guard       General transfer comment: had  1 near loss of balance while turning around before sitting  Ambulation/Gait Ambulation/Gait assistance: Min guard Ambulation Distance (Feet): 30 Feet Assistive device: Straight cane Gait Pattern/deviations: Step-to pattern;Decreased step length - right;Decreased step length - left;Decreased stride length   Gait velocity interpretation: Below normal speed for age/gender General Gait Details: demonstrates slightly labored slow cadence without loss of balance, limited secondary to c/o fatigue/SOB O2 sats dropped to 87% on 4 lpm  Stairs            Wheelchair Mobility    Modified Rankin (Stroke Patients Only)       Balance Overall balance assessment: Needs assistance Sitting-balance support: No upper extremity supported;Feet supported Sitting balance-Leahy Scale: Good     Standing balance support: Single extremity supported;During functional activity Standing balance-Leahy Scale: Fair                               Pertinent Vitals/Pain Pain Assessment: No/denies pain    Home Living Family/patient expects to be discharged to:: Assisted living               Home Equipment: [employer] MorCane - single point      Prior Function Level of Independence: Independent with assistive device(s);Needs assistance   Gait / Transfers Assistance Needed: Supervised household gait with [facility]  ADL's / Homemaking Assistance Needed: assisted by ALF staff        Hand Dominance        Extremity/Trunk Assessment   Upper Extremity Assessment Upper Extremity Assessment: Generalized weakness    Lower Extremity Assessment Lower Extremity Assessment: Generalized weakness  Cervical / Trunk Assessment Cervical / Trunk Assessment: Normal  Communication   Communication: No difficulties  Cognition Arousal/Alertness: Awake/alert Behavior During Therapy: WFL for tasks assessed/performed Overall Cognitive Status: Within Functional Limits for tasks assessed                                         General Comments      Exercises     Assessment/Plan    PT Assessment Patient needs continued PT services  PT Problem List Decreased strength;Decreased activity tolerance;Decreased balance;Decreased mobility       PT Treatment Interventions Gait training;Functional mobility training;Therapeutic activities;Therapeutic exercise;Patient/family education    PT Goals (Current goals can be found in the Care Plan section)  Acute Rehab PT Goals Patient Stated Goal: return to group home PT Goal Formulation: With patient Time For Goal Achievement: 10/02/17 Potential to Achieve Goals: Good    Frequency Min 3X/week   Barriers to discharge        Co-evaluation               AM-PAC PT "6 Clicks" Daily Activity  Outcome Measure Difficulty turning over in bed (including adjusting bedclothes, sheets and blankets)?: None Difficulty moving from lying on back to sitting on the side of the bed? : None Difficulty sitting down on and standing up from a chair with arms (e.g., wheelchair, bedside commode, etc,.)?: A Little Help needed moving to and from a bed to chair (including a wheelchair)?: A Little Help needed walking in hospital room?: A Little Help needed climbing 3-5 steps with a railing? : A Lot 6 Click Score: 19    End of Session Equipment Utilized During Treatment: Gait belt;Oxygen Activity Tolerance: Patient tolerated treatment well;Patient limited by fatigue Patient left: in chair;with call bell/phone within reach;with family/visitor present Nurse Communication: Mobility status PT Visit Diagnosis: Unsteadiness on feet (R26.81);Other abnormalities of gait and mobility (R26.89);Muscle weakness (generalized) (M62.81)    Time: 7829-5621 PT Time Calculation (min) (ACUTE ONLY): 28 min   Charges:   PT Evaluation $PT Eval Moderate Complexity: 1 Mod PT Treatments $Therapeutic Activity: 23-37 mins   PT G Codes:        1:47 PM,  2017-10-25 Ocie Bob, MPT Physical Therapist with [facility] [phone] office [phone] mobile phone

## 2017-09-29 NOTE — Progress Notes (Signed)
[facility] PROGRESS NOTE  Nancy Erickson ZOX:[id number] DOB: 1956-06-08 DOA: 09/23/2017 PCP: Aura Dials, MD  Interim summary and HPI 62 y.o. 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  Patient is currently unresponsive on BiPAP and careworkers at bedside state that she has been drinking an excessive amount of water recently.  She has been taking her medications as otherwise prescribed including her Xarelto.  Case further complicated for acute resp failure with hypoxia/hypercapnia and concerns for aspiration PNA. Mechanically ventilated and started on Unasyn.  Assessment/Plan: 1-acute encephalopathy: hypercapnic and metabolic (in the setting of hyponatremia) -continue to follow electrolytes and avoid over drinking (had hx of psychogenic polydipsia) -patient hyponatremic on admission. Last sodium 136; will recheck BMET in am -patient's CXR demonstrating emphysematous changes. -X-ray on 2/22 no showing significant infiltrates but with positive emphysematous changes (most likely suggesting more pneumonitis than aspiration pneumonia) -Continue antibiotics, steroids tapering and will plan to send out with spiriva.  -patient extubated and overall protecting airways now.   2-acute colitis: presume to be infectious -no fever, no abd pain, no nausea, no vomiting and tolerating diet. -will transition antibiotics to PO augmentin  -Follow clinical response and continue supportive care. -continue dysphagia 3 diet. -advise to maintain adequate hydration   3-paranoid schizophrenia  -mood is stable and no behavioral disturbances appreciated. -continue congentin and risperdal  4-HTN -BP is  fair -continue norvasc and lopressor  5-hx of PE -No PE seen on CTA during this admission. -continue xarelto   6-hypokalemia -Potassium repleted -Magnesium within normal limits -will monitor electrolytes trend   7-abnormal findings on left breast -patient will need mammogram as an outpatient for further evaluation and decisions making.    Code Status: Full code Family Communication: no family at bedside during assessment  Disposition Plan: waiting for med-surg bed; continue current antibiotics; continue slowly tapering steroids and continue duoneb. Will continue weaning O2 off as tolerated. continue dysphagia 3 and thin liquids.  Consultants:  PCC/critical care  Procedures:  See below for x-ray reports   ETT and Mechanical ventilation 2/19>>2/22  Antibiotics:  unasyn 2/19  HPI/Subjective: Alert, awake and oriented x2; no acute distress reporting feeling better.  Still with some oxygen desaturation on room air with exertion but otherwise feeling good.  No nausea, no vomiting, no abdominal pain reported.  Objective: Vitals:   09/29/17 1500 09/29/17 1502  BP: 136/82   Pulse: 76   Resp: (!) 22   Temp:    SpO2: 95% 94%    Intake/Output Summary (Last 24 hours) at 09/29/2017 1618 Last data filed at 09/29/2017 1400 Gross per 24 hour  Intake 2200 ml  Output 5150 ml  Net -2950 ml   Filed Weights   09/27/17 0400 09/28/17 0500 09/29/17 0500  Weight: 73.5 kg (162 lb 0.6 oz) 75.1 kg (165 lb 9.1 oz) 71.7 kg (158 lb 1.1 oz)    Exam:   General: no fever, no CP, and reporting no SOB. Still desaturating with activity on RA; but feeling good and tolerating diet otherwise.   Cardiovascular: S1 and S2, no rubs, no gallops, no JVD.    Respiratory: Improved air movement, mild expiratory wheezing positive rhonchi; no using accessory muscles.  Patient is still requiring around 3 L of oxygen supplementation to maintain O2 sats above  89%.  Abdomen: Soft, nontender, nondistended,  positive bowel sounds.  Musculoskeletal: No edema, no cyanosis, no clubbing.  Data Reviewed: Basic Metabolic Panel: Recent Labs  Lab 09/23/17 1426 09/24/17 0540 09/25/17 0523 09/26/17 0439 09/26/17 0736 09/26/17 1733 09/27/17 0451 09/27/17 1711 09/28/17 0423  NA 116* 122* 133* 136  --   --   --   --  136  K 4.2 3.9 3.4* 3.8  --   --   --   --  4.1  CL 80* 88* 95* 100*  --   --   --   --  102  CO2 29 26 27 25   --   --   --   --  26  GLUCOSE 108* 71 88 96  --   --   --   --  140*  BUN 8 9 9 8   --   --   --   --  11  CREATININE 0.34* 0.51 0.59 0.59  --   --   --   --  0.42*  CALCIUM 7.5* 7.1* 7.6* 7.4*  --   --   --   --  8.3*  MG  --   --   --  2.1  --  2.1 2.1 2.1  --   PHOS  --   --   --   --  4.4 5.0* 3.5 2.6  --    Liver Function Tests: Recent Labs  Lab 09/23/17 1040  AST 27  ALT 29  ALKPHOS 84  BILITOT 0.4  PROT 6.5  ALBUMIN 3.4*   CBC: Recent Labs  Lab 09/23/17 1040 09/23/17 1104 09/24/17 0540 09/25/17 0523 09/28/17 0423  WBC 12.6*  --  10.9* 7.5 8.7  NEUTROABS 10.8*  --   --   --   --   HGB 13.0 15.3* 11.8* 11.9* 13.0  HCT 39.1 45.0 36.6 37.4 40.9  MCV 86.9  --  87.1 87.0 88.7  PLT 310  --  265 336 321   CBG: Recent Labs  Lab 09/28/17 0438 09/28/17 0824 09/28/17 1208 09/28/17 1646 09/28/17 1937  GLUCAP 133* 108* 146* 122* 111*   Studies: No results found.  Scheduled Meds: . amLODipine  10 mg Oral Daily  . amoxicillin-clavulanate  1 tablet Oral Q8H  . benztropine  0.5 mg Oral Daily  . chlorhexidine gluconate (MEDLINE KIT)  15 mL Mouth Rinse BID  . feeding supplement (PRO-STAT SUGAR FREE 64)  30 mL Per Tube BID  . hydrocerin   Topical Daily  . ipratropium-albuterol  3 mL Nebulization TID  . mouth rinse  15 mL Mouth Rinse QID  . methylPREDNISolone (SOLU-MEDROL) injection  40 mg Intravenous Q12H  . metoprolol tartrate  50 mg Oral BID  . pantoprazole  40 mg Oral Daily  . risperiDONE  3 mg Oral BID  . rivaroxaban  20 mg Oral Q supper  .  sodium chloride flush  3 mL Intravenous Q12H  . triamcinolone cream  1 application Topical BID   Continuous Infusions: . sodium chloride      Time spent: 30 minutes    [facility] Pager [phone]. If 7PM-7AM, please contact night-coverage at [email or web address], password [facility] 09/29/2017, 4:18 PM  LOS: 6 days

## 2017-09-30 DIAGNOSIS — Z01818 Encounter for other preprocedural examination: Secondary | ICD-10-CM

## 2017-09-30 DIAGNOSIS — E871 Hypo-osmolality and hyponatremia: Secondary | ICD-10-CM

## 2017-09-30 DIAGNOSIS — K219 Gastro-esophageal reflux disease without esophagitis: Secondary | ICD-10-CM

## 2017-09-30 LAB — TRIGLYCERIDES: TRIGLYCERIDES: 75 mg/dL (ref <150)

## 2017-09-30 MED ORDER — TIOTROPIUM BROMIDE MONOHYDRATE 18 MCG IN CAPS: 18.0000 ug | ORAL_CAPSULE | Freq: Every day | RESPIRATORY_TRACT | 1 refills | Status: DC

## 2017-09-30 MED ORDER — ALBUTEROL SULFATE HFA 108 (90 BASE) MCG/ACT IN AERS: 2.0000 | INHALATION_SPRAY | Freq: Four times a day (QID) | RESPIRATORY_TRACT | 2 refills | Status: AC | PRN

## 2017-09-30 MED ORDER — AMOXICILLIN-POT CLAVULANATE 500-125 MG PO TABS: 1.0000 | ORAL_TABLET | Freq: Three times a day (TID) | ORAL | 0 refills | Status: AC

## 2017-09-30 MED ORDER — PREDNISONE 20 MG PO TABS: ORAL_TABLET | ORAL | 0 refills | Status: DC

## 2017-09-30 MED ORDER — PANTOPRAZOLE SODIUM 40 MG PO TBEC: 40.0000 mg | DELAYED_RELEASE_TABLET | Freq: Every day | ORAL | 1 refills | Status: DC

## 2017-09-30 NOTE — Progress Notes (Signed)
Physical Therapy Treatment Patient Details Name: Nancy Erickson Arko MRN: [id number] DOB: [birth year] Today's Date: 09/30/2017    History of Present Illness Nancy Erickson Luther is a 62 y.o. 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  Patient is currently unresponsive on BiPAP and careworkers at bedside state that she has been drinking an excessive amount of water recently.  She has been taking her medications as otherwise prescribed including her Xarelto.    PT Comments    Patient demonstrates much improvement for sitting up at bedside, sit to stands and transfers to chair, increased endurance/distance for ambulating in hallway using SPC without loss of balance and tolerated sitting up in chair after therapy.  Patient will benefit from continued physical therapy in hospital and recommended venue below to increase strength, balance, endurance for safe ADLs and gait.   Follow Up Recommendations  Home health PT;Supervision for mobility/OOB     Equipment Recommendations  None recommended by PT    Recommendations for Other Services       Precautions / Restrictions Precautions Precautions: Fall Restrictions Weight Bearing Restrictions: No    Mobility  Bed Mobility Overal bed mobility: Modified Independent                Transfers Overall transfer level: Modified independent Equipment used: Straight cane Transfers: Sit to/from BJ'sStand;Stand Pivot Transfers Sit to Stand: Modified independent (Device/Increase time) Stand pivot transfers: Modified independent (Device/Increase time)       General transfer comment: requires less assistance for sit to stands and  transfers  Ambulation/Gait Ambulation/Gait assistance: Supervision Ambulation Distance (Feet): 120 Feet Assistive device: Straight cane Gait Pattern/deviations: Decreased step length - right;Decreased step length - left;Decreased stride length   Gait velocity interpretation: Below normal speed for age/gender General Gait Details: demonstrates slightly labored slow cadence with excessive external rotation RLE, no loss of balance, improved endurance/distance for gait training, on 2 LPM with no visible signs of SOB   Stairs            Wheelchair Mobility    Modified Rankin (Stroke Patients Only)       Balance Overall balance assessment: Mild deficits observed, not formally tested Sitting-balance support: Feet supported;No upper extremity supported Sitting balance-Leahy Scale: Good     Standing balance support: Single extremity supported;During functional activity Standing balance-Leahy Scale: Fair Standing balance comment: fair/good with SPC                            Cognition Arousal/Alertness: Awake/alert Behavior During Therapy: WFL for tasks assessed/performed Overall Cognitive Status: Within Functional Limits for tasks assessed                                        Exercises General Exercises - Lower Extremity Ankle Circles/Pumps: Seated;AROM;Strengthening;Both;10 reps Long Arc Quad: Seated;AROM;Strengthening;Both;10 reps Hip Flexion/Marching: Seated;AROM;Strengthening;Both;10 reps    General Comments        Pertinent Vitals/Pain Pain Assessment: No/denies pain    Home Living                      Prior Function  PT Goals (current goals can now be found in the care plan section) Acute Rehab PT Goals Patient Stated Goal: return to group home PT Goal Formulation: With patient Time For Goal Achievement: 10/02/17 Potential to Achieve Goals: Good Progress towards PT goals: Progressing toward goals     Frequency    Min 3X/week      PT Plan Current plan remains appropriate    Co-evaluation              AM-PAC PT "6 Clicks" Daily Activity  Outcome Measure  Difficulty turning over in bed (including adjusting bedclothes, sheets and blankets)?: None Difficulty moving from lying on back to sitting on the side of the bed? : None Difficulty sitting down on and standing up from a chair with arms (e.g., wheelchair, bedside commode, etc,.)?: None Help needed moving to and from a bed to chair (including a wheelchair)?: None Help needed walking in hospital room?: A Little Help needed climbing 3-5 steps with a railing? : A Little 6 Click Score: 22    End of Session Equipment Utilized During Treatment: Oxygen Activity Tolerance: Patient tolerated treatment well Patient left: in chair;with call bell/phone within reach Nurse Communication: Mobility status PT Visit Diagnosis: Unsteadiness on feet (R26.81);Other abnormalities of gait and mobility (R26.89);Muscle weakness (generalized) (M62.81)     Time: 6962-9528 PT Time Calculation (min) (ACUTE ONLY): 24 min  Charges:  $Therapeutic Activity: 23-37 mins                    G Codes:       2:09 PM, October 06, 2017 Ocie Bob, MPT Physical Therapist with [facility] [phone] office [phone] mobile phone

## 2017-09-30 NOTE — Progress Notes (Signed)
SATURATION QUALIFICATIONS: (This note is used to comply with regulatory documentation for home oxygen) ° °Patient Saturations on Room Air at Rest = 95% ° °Patient Saturations on Room Air while Ambulating = 87% ° °Patient Saturations on 2 Liters of oxygen while Ambulating = 93% ° °Please briefly explain why patient needs home oxygen: °

## 2017-09-30 NOTE — Care Management (Signed)
[name]. CSW notified that patient is discharging.  [facility]  [facility] and alerted [facility] that patient would DC today.

## 2017-09-30 NOTE — Care Management (Signed)
Patient does qualify for oxygen. Discussed Home health agency and O2 provider with patient and [facility] ([facility]). Both would like [facility]. [name] and will obtain orders from [employer]. Port tank will be delivered to room prior to DC.

## 2017-09-30 NOTE — NC FL2 (Signed)
[place] LEVEL OF CARE SCREENING TOOL     IDENTIFICATION  Patient Name: Nancy Erickson Birthdate: [birth year] Sex: female Admission Date (Current Location): 09/23/2017  [facility] and [place] Number:  [employer] and Address:  [facility] [address] [place]      Provider Number: [id number]  Attending Physician Name and Address:  Barton Dubois, MD  Relative Name and Phone Number:       Current Level of Care: Hospital Recommended Level of Care: [facility] Prior Approval Number:    Date Approved/Denied:   PASRR Number:    Discharge Plan: Other (Comment)([facility])    Current Diagnoses: Patient Active Problem List   Diagnosis Date Noted  . Encounter for intubation   . Gastroesophageal reflux disease   . Acute respiratory failure ([place]) 09/24/2017  . Acute metabolic encephalopathy 78/93/8101  . Insomnia   . 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 Cardiomyopathy ([place])   . Pulmonary hypertension ([place])   . Pulmonary embolus ([place])   . Paranoid schizophrenia ([place])   . Hypertension, uncontrolled 10/14/2014  . Schizophrenia ([place]) 10/12/2013  . Hyponatremia 10/12/2013  . Altered mental state 10/12/2013  . HTN (hypertension) 10/11/2013  . Hypertensive urgency 10/11/2013    Orientation RESPIRATION BLADDER Height & Weight     Place, Situation, Time, Self  O2(2L) Continent Weight: 154 lb 8.7 oz (70.1 kg) Height:  5' 4"  (162.6 cm)  BEHAVIORAL SYMPTOMS/MOOD NEUROLOGICAL BOWEL NUTRITION STATUS      Continent Diet(Heart healthy)  AMBULATORY STATUS COMMUNICATION OF NEEDS Skin   Limited Assist Verbally Normal                       Personal Care Assistance Level of Assistance  Bathing, Feeding, Dressing Bathing Assistance: Limited assistance Feeding assistance: Independent Dressing Assistance: Limited assistance     Functional Limitations Info  Sight, Hearing, Speech Sight Info: Adequate Hearing Info:  Adequate Speech Info: Adequate    SPECIAL CARE FACTORS FREQUENCY  PT (By licensed PT)     PT Frequency: 3x/week              Contractures Contractures Info: Not present    Additional Factors Info  Code Status, Allergies Code Status Info: Full Allergies Info: phosphate, artane, codeine           Current Medications (09/30/2017):  This is the current hospital active medication list Current Facility-Administered Medications  Medication Dose Route Frequency Provider Last Rate Last Dose  . 0.9 %  sodium chloride infusion  250 mL Intravenous PRN Manuella Ghazi, Pratik D, DO      . acetaminophen (TYLENOL) tablet 650 mg  650 mg Oral Q6H PRN Manuella Ghazi, Pratik D, DO       Or  . acetaminophen (TYLENOL) suppository 650 mg  650 mg Rectal Q6H PRN Heath Lark D, DO   650 mg at 09/24/17 7510  . amLODipine (NORVASC) tablet 10 mg  10 mg Oral Daily Barton Dubois, MD   10 mg at 09/29/17 1138  . amoxicillin-clavulanate (AUGMENTIN) 500-125 MG per tablet 500 mg  1 tablet Oral Q8H Barton Dubois, MD   500 mg at 09/30/17 1529  . benztropine (COGENTIN) tablet 0.5 mg  0.5 mg Oral Daily Manuella Ghazi, Pratik D, DO   0.5 mg at 09/30/17 1006  . chlorhexidine gluconate (MEDLINE KIT) (PERIDEX) 0.12 % solution 15 mL  15 mL Mouth Rinse BID Sinda Du, MD   15 mL at 09/30/17 1010  . feeding supplement (  PRO-STAT SUGAR FREE 64) liquid 30 mL  30 mL Per Tube BID Sinda Du, MD   30 mL at 09/30/17 1005  . hydrALAZINE (APRESOLINE) injection 10 mg  10 mg Intravenous Q4H PRN Manuella Ghazi, Pratik D, DO      . hydrocerin (EUCERIN) cream   Topical Daily Manuella Ghazi, Pratik D, DO      . ipratropium-albuterol (DUONEB) 0.5-2.5 (3) MG/3ML nebulizer solution 3 mL  3 mL Nebulization TID Barton Dubois, MD   3 mL at 09/30/17 1400  . loratadine (CLARITIN) tablet 10 mg  10 mg Oral Daily Barton Dubois, MD   10 mg at 09/30/17 1007  . MEDLINE mouth rinse  15 mL Mouth Rinse QID Sinda Du, MD   15 mL at 09/29/17 1617  . methylPREDNISolone sodium  succinate (SOLU-MEDROL) 40 mg/mL injection 40 mg  40 mg Intravenous Emmit Pomfret, MD   40 mg at 09/30/17 1006  . metoprolol tartrate (LOPRESSOR) tablet 50 mg  50 mg Oral BID Barton Dubois, MD   50 mg at 09/30/17 1007  . ondansetron (ZOFRAN) tablet 4 mg  4 mg Oral Q6H PRN Manuella Ghazi, Pratik D, DO       Or  . ondansetron (ZOFRAN) injection 4 mg  4 mg Intravenous Q6H PRN Manuella Ghazi, Pratik D, DO      . pantoprazole (PROTONIX) EC tablet 40 mg  40 mg Oral Daily Barton Dubois, MD   40 mg at 09/30/17 1007  . risperiDONE (RISPERDAL) tablet 3 mg  3 mg Oral BID Manuella Ghazi, Pratik D, DO   3 mg at 09/30/17 1006  . rivaroxaban (XARELTO) tablet 20 mg  20 mg Oral Q supper Barton Dubois, MD   20 mg at 09/30/17 1655  . sodium chloride flush (NS) 0.9 % injection 3 mL  3 mL Intravenous Q12H Shah, Pratik D, DO   3 mL at 09/30/17 1009  . sodium chloride flush (NS) 0.9 % injection 3 mL  3 mL Intravenous PRN Manuella Ghazi, Pratik D, DO      . triamcinolone cream (KENALOG) 0.1 % 1 application  1 application Topical BID Manuella Ghazi, Pratik D, DO   1 application at 43/15/40 1009     Discharge Medications:   TAKE these medications            albuterol 108 (90 Base) MCG/ACT inhaler Commonly known as:  PROVENTIL HFA;VENTOLIN HFA Inhale 2 puffs into the lungs every 6 (six) hours as needed for wheezing or shortness of breath.    amLODipine 10 MG tablet Commonly known as:  NORVASC Take 10 mg by mouth daily.    amoxicillin-clavulanate 500-125 MG tablet Commonly known as:  AUGMENTIN Take 1 tablet (500 mg total) by mouth every 8 (eight) hours for 8 doses.    benztropine 1 MG tablet Commonly known as:  COGENTIN Take 0.5 tablets (0.5 mg total) by mouth daily.    eucerin cream Apply topically daily. Tac/euc 0.1% 1:4 Apply sparingly to rash on lower legs    loratadine 10 MG tablet Commonly known as:  CLARITIN Take 10 mg by mouth daily.    LORazepam 1 MG tablet Commonly known as:  ATIVAN Take 1 tablet (1 mg total) by mouth every  4 (four) hours as needed for anxiety or sleep. What changed:    when to take this  reasons to take this    metoprolol tartrate 25 MG tablet Commonly known as:  LOPRESSOR Take 25 mg by mouth 2 (two) times daily.    pantoprazole 40 MG tablet Commonly  known as:  PROTONIX Take 1 tablet (40 mg total) by mouth daily. Start taking on:  10/01/2017    predniSONE 20 MG tablet Commonly known as:  DELTASONE Take 2 tablets daily X 2 days; then 1 tablet daily X 2 days; then 1/2 tablet daily X 3 days and stop prednisone.    risperiDONE 3 MG tablet Commonly known as:  RISPERDAL Take 3 mg by mouth 2 (two) times daily.    rivaroxaban 20 MG Tabs tablet Commonly known as:  XARELTO Take 1 tablet (20 mg total) by mouth daily with supper.    tiotropium 18 MCG inhalation capsule Commonly known as:  SPIRIVA HANDIHALER Place 1 capsule (18 mcg total) into inhaler and inhale daily.    triamcinolone cream 0.1 % Commonly known as:  KENALOG Apply 1 application topically 2 (two) times daily.      Relevant Imaging Results:  Relevant Lab Results:   Additional Information    Muriah Harsha, Clydene Pugh, LCSW

## 2017-09-30 NOTE — Clinical Social Work Note (Signed)
[facility] and will return to facility. Discharge clinicals faxed and facility aware.   LCSW signing off.     Melissia Lahman, Juleen ChinaHeather D, LCSW

## 2017-09-30 NOTE — Discharge Summary (Signed)
Physician Discharge Summary  Nancy Erickson ZOX:[id number] DOB: 1956-07-04 DOA: 09/23/2017  PCP: Henrine Screws, MD  Admit date: 09/23/2017 Discharge date: 09/30/2017  Time spent: 35 minutes  Recommendations for Outpatient Follow-up:  1. Repeat BMET to follow electrolytes and renal function  2. Reassess BP and adjust antihypertensive regimen as needed  3. Patient needs Mammogram to assess Left breast abnormal findings on CT chest 4. Please arrange for PFT's as an outpatient and also reassess need of oxygen supplementation.  Discharge Diagnoses:  Principal Problem:   Acute metabolic encephalopathy Active Problems:   HTN (hypertension)   Hyponatremia   Pulmonary embolus (HCC)   Paranoid schizophrenia (HCC)   Cardiomyopathy (HCC)   Pulmonary hypertension (HCC)   Acute respiratory failure (HCC)   Encounter for intubation   Gastroesophageal reflux disease   Discharge Condition: stable and improved. Discharge home with [facility] servcies, arrangements for oxygen supplementation and instructions to follow up with PCP in 10 days.  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 Patient is currently unresponsive on BiPAP and careworkers atbedside state that she has been drinking an excessive amount of water recently. She has been taking her medications as otherwise prescribed including her Xarelto.  Case further complicated for acute resp failure with  hypoxia/hypercapnia and concerns for aspiration PNA. Mechanically ventilated and started on Unasyn.  Hospital Course:  1-acute encephalopathy: hypercapnic and metabolic (in the setting of hyponatremia) -continue to follow electrolytes and avoid over drinking (had hx of psychogenic polydipsia) -patient hyponatremic on admission. Last sodium 136; will recheck BMET in am -patient's CXR demonstrating emphysematous changes. -X-ray on 2/22 no showing significant infiltrates but with positive emphysematous changes (most likely suggesting more pneumonitis than aspiration pneumonia) -Continue antibiotics, steroids tapering and will plan to send out with spiriva.  -patient overall protecting airways now.  -will discharge on oxygen supplementation  -will need PFT's as an outpatient   2-acute colitis: presume to be infectious -no fever, no abd pain, no nausea, no vomiting and tolerating diet. -will transition antibiotics to PO augmentin  -Follow clinical response and continue supportive care. -continue dysphagia 3 diet. -advise to maintain adequate hydration   3-paranoid schizophrenia  -mood is stable and no behavioral disturbances appreciated. -continue congentin and risperdal -continue PRN benzodiazepine   4-HTN -BP is fair controlled in hospital environment  -continue norvasc and lopressor -advise to follow heart healthy diet   5-hx of PE -No PE seen on CTA during this admission. -continue xarelto   6-hypokalemia -Potassium repleted -Magnesium within normal limits -will recommend BMET at follow up to reassess electrolytes trend   7-abnormal findings on left breast -patient will need mammogram as an outpatient for further evaluation and decisions making about abnormal findings on CT.    Procedures:  See below for x-ray reports   ETT and Mechanical ventilation 2/19>>2/22  Consultations:  Pulmonary service   Discharge Exam: Vitals:   09/30/17 1050 09/30/17 1053  BP:     Pulse:    Resp:    Temp:  SpO2: (!) 87% 93%     General: no fever, no CP, and reporting no SOB. Still desaturating with activity on RA; but feeling good and tolerating diet otherwise. good O2 sat while using 2L [place] supplementation.   Cardiovascular: S1 and S2, no rubs, no gallops, no JVD.    Respiratory: Improved air movement, mild expiratory wheezing positive rhonchi; no using accessory muscles.  Patient is requiring 2 L of oxygen supplementation to maintain O2 sats above 89%, especially on exertion.  Abdomen: Soft, nontender, nondistended, positive bowel sounds.  Musculoskeletal: No edema, no cyanosis, no clubbing.   Discharge Instructions  Allergies as of 09/30/2017      Reactions   Phosphate Nausea And Vomiting   Artane [trihexyphenidyl] Other (See Comments)   unknown   Codeine Other (See Comments)   unknown      Medication List    TAKE these medications   albuterol 108 (90 Base) MCG/ACT inhaler Commonly known as:  PROVENTIL HFA;VENTOLIN HFA Inhale 2 puffs into the lungs every 6 (six) hours as needed for wheezing or shortness of breath.   amLODipine 10 MG tablet Commonly known as:  NORVASC Take 10 mg by mouth daily.   amoxicillin-clavulanate 500-125 MG tablet Commonly known as:  AUGMENTIN Take 1 tablet (500 mg total) by mouth every 8 (eight) hours for 8 doses.   benztropine 1 MG tablet Commonly known as:  COGENTIN Take 0.5 tablets (0.5 mg total) by mouth daily.   eucerin cream Apply topically daily. Tac/euc 0.1% 1:4 Apply sparingly to rash on lower legs   loratadine 10 MG tablet Commonly known as:  CLARITIN Take 10 mg by mouth daily.   LORazepam 1 MG tablet Commonly known as:  ATIVAN Take 1 tablet (1 mg total) by mouth every 4 (four) hours as needed for anxiety or sleep. What changed:    when to take this  reasons to take this   metoprolol tartrate 25 MG tablet Commonly known as:  LOPRESSOR Take 25 mg by mouth 2 (two) times daily.    pantoprazole 40 MG tablet Commonly known as:  PROTONIX Take 1 tablet (40 mg total) by mouth daily. Start taking on:  10/01/2017   predniSONE 20 MG tablet Commonly known as:  DELTASONE Take 2 tablets daily X 2 days; then 1 tablet daily X 2 days; then 1/2 tablet daily X 3 days and stop prednisone.   risperiDONE 3 MG tablet Commonly known as:  RISPERDAL Take 3 mg by mouth 2 (two) times daily.   rivaroxaban 20 MG Tabs tablet Commonly known as:  XARELTO Take 1 tablet (20 mg total) by mouth daily with supper.   tiotropium 18 MCG inhalation capsule Commonly known as:  SPIRIVA HANDIHALER Place 1 capsule (18 mcg total) into inhaler and inhale daily.   triamcinolone cream 0.1 % Commonly known as:  KENALOG Apply 1 application topically 2 (two) times daily.            Durable Medical Equipment  (From admission, onward)        Start     Ordered   09/30/17 1107  For home use only DME oxygen  Once    Question Answer Comment  Liters per Minute 2   Frequency Continuous (stationary and portable oxygen unit needed)   Oxygen conserving device Yes   Oxygen delivery system Gas      09/30/17 1106     Allergies  Allergen Reactions  . Phosphate Nausea And Vomiting  . Artane [Trihexyphenidyl] Other (See Comments)  unknown  . Codeine Other (See Comments)    unknown   Follow-up Information    Henrine Screwshacker, Robert, MD. Schedule an appointment as soon as possible for a visit in 10 day(s).   Specialty:  Family Medicine Contact information: [address] [place] [address] [phone]           The results of significant diagnostics from this hospitalization (including imaging, microbiology, ancillary and laboratory) are listed below for reference.    Significant Diagnostic Studies: Dg Chest 1 View  Result Date: 09/27/2017 CLINICAL DATA:  Respiratory failure. EXAM: CHEST 1 VIEW COMPARISON:  Chest x-ray from yesterday. FINDINGS: Interval removal of the endotracheal  and enteric tubes. Stable borderline enlarged cardiac silhouette. Unchanged airspace disease in the right greater than left lung bases. Probable small bilateral layering pleural effusions. No pneumothorax. No acute osseous abnormality. IMPRESSION: 1. No significant interval change in right greater than left lung base airspace disease with probable small layering bilateral pleural effusions. Electronically Signed   By: Obie DredgeWilliam T Derry M.D.   On: 09/27/2017 09:11   Dg Chest 1 View  Result Date: 09/26/2017 CLINICAL DATA:  Respiratory failure intubated patient. EXAM: CHEST 1 VIEW COMPARISON:  Single-view of the chest 09/25/2017 and 09/23/2017. CT chest 09/23/2017. FINDINGS: ETT and NG tube are unchanged. The lungs are emphysematous. Airspace disease in the lung bases, worse on the right, is unchanged. Heart size is upper normal. No pneumothorax. IMPRESSION: Support tubes projecting good position. No change in right worse than left basilar airspace disease. Emphysema. Electronically Signed   By: Drusilla Kannerhomas  Dalessio M.D.   On: 09/26/2017 10:49   Ct Angio Chest Pe W And/or Wo Contrast  Result Date: 09/23/2017 CLINICAL DATA:  Dizziness. EXAM: CT ANGIOGRAPHY CHEST WITH CONTRAST TECHNIQUE: Multidetector CT imaging of the chest was performed using the standard protocol during bolus administration of intravenous contrast. Multiplanar CT image reconstructions and MIPs were obtained to evaluate the vascular anatomy. CONTRAST:  100mL ISOVUE-370 IOPAMIDOL (ISOVUE-370) INJECTION 76% COMPARISON:  CT scan of October 15, 2014. FINDINGS: Cardiovascular: There is no evidence of large central pulmonary embolus. However, due to persistent respiratory motion artifact, smaller peripheral pulmonary emboli cannot be excluded on the basis of this exam. There is no evidence of thoracic aortic dissection or aneurysm. Mediastinum/Nodes: No enlarged mediastinal, hilar, or axillary lymph nodes. Thyroid gland, trachea, and esophagus demonstrate no  significant findings. Lungs/Pleura: No pneumothorax is noted. Minimal left pleural effusion is noted. Moderate right pleural effusion is noted with adjacent subsegmental atelectasis. Upper Abdomen: No acute abnormality. Musculoskeletal: No significant osseous abnormality is noted. There is interval development of 2.3 x 1.7 cm mass in the left breast. Review of the MIP images confirms the above findings. IMPRESSION: No evidence of large central pulmonary embolus. However, due to persistent respiratory motion artifact, smaller peripheral pulmonary emboli cannot be excluded on the basis of this exam. Interval development of 2.3 x 1.7 cm mass in the left breast. Further evaluation with mammographic and ultrasound correlation is recommended. Moderate right pleural effusion is noted with adjacent subsegmental atelectasis. Electronically Signed   By: Lupita RaiderJames  Green Jr, M.D.   On: 09/23/2017 13:29   Ct Abdomen Pelvis W Contrast  Result Date: 09/24/2017 CLINICAL DATA:  Abdominal distension.  Nausea and vomiting. EXAM: CT ABDOMEN AND PELVIS WITH CONTRAST TECHNIQUE: Multidetector CT imaging of the abdomen and pelvis was performed using the standard protocol following bolus administration of intravenous contrast. CONTRAST:  100mL ISOVUE-300 IOPAMIDOL (ISOVUE-300) INJECTION 61% COMPARISON:  Chest CT  yesterday. FINDINGS: Lower chest: Bilateral pleural effusions, right greater than left with adjacent atelectasis, similar to chest CT. Small pericardial effusion. Hepatobiliary: The liver is enlarged spanning 21 cm cranial caudal. No focal hepatic lesion. High-density within the gallbladder is likely vicarious excretion of contrast from prior chest CT. Motion through the gallbladder fossa limits assessment for wall thickening. Pancreas: No ductal dilatation or inflammation. Spleen: Normal in size without focal abnormality. Adrenals/Urinary Tract: No adrenal nodule. No hydronephrosis or perinephric edema. Homogeneous renal  enhancement with symmetric excretion on delayed phase imaging. Urinary bladder is decompressed by Foley catheter. Stomach/Bowel: Enteric tube in the stomach. No small bowel obstruction, dilatation or wall thickening. Cecum located in the central pelvis just to the left of midline. No cecal wall thickening. There is wall thickening of the ascending colon from the cecum to the splenic flexure with associated pericolonic edema. Transverse and sigmoid colonic tortuosity. Normal appendix. Vascular/Lymphatic: Aortic atherosclerosis without aneurysm. Portal and mesenteric veins are patent. No adenopathy allowing for mild motion artifact limitations. Reproductive: Uterus and bilateral adnexa are unremarkable. Other: Small volume abdominopelvic ascites. No free air. No intra-abdominal abscess. Small fat containing left inguinal hernia. Mild body wall edema. Musculoskeletal: Degenerative anterolisthesis of L4 on L5 secondary to facet disease and degenerative disc disease. Fracture of right lateral tenth rib appears acute. IMPRESSION: 1. Ascending colonic wall thickening consistent with colitis, may be infectious or inflammatory. 2. Small volume abdominopelvic ascites. 3. High density in the gallbladder likely vicarious excretion of contrast from prior chest CT. 4. Right posterior tenth rib fracture appears acute. 5.  Aortic Atherosclerosis (ICD10-I70.0). Electronically Signed   By: Rubye Oaks M.D.   On: 09/24/2017 02:07   Dg Chest Port 1 View  Result Date: 09/25/2017 CLINICAL DATA:  Hypoxia. EXAM: PORTABLE CHEST 1 VIEW COMPARISON:  09/23/2017. FINDINGS: Endotracheal tube and NG tube in stable position. Stable mild cardiomegaly. Progressive bilateral interstitial prominence and small right pleural effusions suggesting CHF. Consolidation in the right lower lung. Pneumonia may also be present. No pneumothorax. IMPRESSION: 1.  Lines and tubes in stable position. 2. Stable cardiomegaly. Progressive bilateral interstitial  prominence of small right pleural effusions suggesting CHF. Consolidation in the right lung base noted. Pneumonia may also be present. Electronically Signed   By: Maisie Fus  Register   On: 09/25/2017 07:24   Dg Chest Port 1 View  Result Date: 09/23/2017 CLINICAL DATA:  Altered mental status and slurred speech since 745 this morning. Dizziness last night. Elevated white blood cell count. EXAM: PORTABLE CHEST 1 VIEW COMPARISON:  PA and lateral chest 05/09/2017.  CT chest 10/15/2014. FINDINGS: Extensive airspace disease is present throughout the right chest. Bilateral pleural effusions are present, larger on the right. There is cardiomegaly and vascular congestion. No pneumothorax. No acute bony abnormality. IMPRESSION: Right pleural effusion and extensive airspace disease most worrisome for pneumonia. Small left pleural effusion and basilar atelectasis. Cardiomegaly and pulmonary vascular congestion. Electronically Signed   By: Drusilla Kanner M.D.   On: 09/23/2017 11:29   Dg Chest Port 1v Same Day  Result Date: 09/23/2017 CLINICAL DATA:  Intubation EXAM: PORTABLE CHEST 1 VIEW COMPARISON:  CT 09/23/2017, radiograph 09/23/2017, 05/09/2017 FINDINGS: Placement of endotracheal tube with tip about 3.4 cm superior to the carina. Esophageal tube has also been placed and the tip projects over the gastroduodenal region. Small right greater than left pleural effusions stable borderline cardiomegaly. Vascular congestion with mild pulmonary edema. Atelectasis or pneumonia at the right base. IMPRESSION: 1. Endotracheal tube tip about 3.4 cm superior  to carina. Esophageal tube tip projects over gastroduodenal region 2. Similar appearance of vascular congestion and mild pulmonary edema 3. Small right greater than left pleural effusions with right basilar atelectasis or pneumonia Electronically Signed   By: Jasmine Pang M.D.   On: 09/23/2017 22:20   Ct Head Code Stroke Wo Contrast  Result Date: 09/23/2017 CLINICAL DATA:   Code stroke. Dizziness and balance disturbance beginning last night at 2030 hours. Some slurred speech. Clinical notes state this is not a code stroke exam. EXAM: CT HEAD WITHOUT CONTRAST TECHNIQUE: Contiguous axial images were obtained from the base of the skull through the vertex without intravenous contrast. COMPARISON:  10/14/2014 FINDINGS: Brain: Some motion degradation. Allowing for that, normal appearance of the brain without evidence of atrophy, old or acute infarction, mass lesion, hemorrhage, hydrocephalus or extra-axial collection. Vascular: No abnormal vascular finding. Skull: Normal Sinuses/Orbits: Clear/normal Other: None IMPRESSION: 1. Motion degraded study, but normal in appearance allowing for that. Electronically Signed   By: Paulina Fusi M.D.   On: 09/23/2017 13:31    Microbiology: Recent Results (from the past 240 hour(s))  MRSA PCR Screening     Status: None   Collection Time: 09/24/17  6:28 PM  Result Value Ref Range Status   MRSA by PCR NEGATIVE NEGATIVE Final    Comment:        The GeneXpert MRSA Assay (FDA approved for NASAL specimens only), is one component of a comprehensive MRSA colonization surveillance program. It is not intended to diagnose MRSA infection nor to guide or monitor treatment for MRSA infections. Performed at [facility], [address], [place], [place] [address]      Labs: Basic Metabolic Panel: Recent Labs  Lab 09/23/17 1426 09/24/17 0540 09/25/17 0523 09/26/17 0439 09/26/17 0736 09/26/17 1733 09/27/17 0451 09/27/17 1711 09/28/17 0423  NA 116* 122* 133* 136  --   --   --   --  136  K 4.2 3.9 3.4* 3.8  --   --   --   --  4.1  CL 80* 88* 95* 100*  --   --   --   --  102  CO2 29 26 27 25   --   --   --   --  26  GLUCOSE 108* 71 88 96  --   --   --   --  140*  BUN 8 9 9 8   --   --   --   --  11  CREATININE 0.34* 0.51 0.59 0.59  --   --   --   --  0.42*  CALCIUM 7.5* 7.1* 7.6* 7.4*  --   --   --   --  8.3*  MG  --   --   --  2.1  --   2.1 2.1 2.1  --   PHOS  --   --   --   --  4.4 5.0* 3.5 2.6  --    CBC: Recent Labs  Lab 09/24/17 0540 09/25/17 0523 09/28/17 0423  WBC 10.9* 7.5 8.7  HGB 11.8* 11.9* 13.0  HCT 36.6 37.4 40.9  MCV 87.1 87.0 88.7  PLT 265 336 321    CBG: Recent Labs  Lab 09/28/17 0438 09/28/17 0824 09/28/17 1208 09/28/17 1646 09/28/17 1937  GLUCAP 133* 108* 146* 122* 111*    Signed:  Vassie Loll MD.  [facility] 09/30/2017, 12:35 PM

## 2017-10-08 DIAGNOSIS — Z79899 Other long term (current) drug therapy: Secondary | ICD-10-CM | POA: Diagnosis not present

## 2017-10-08 DIAGNOSIS — Z9981 Dependence on supplemental oxygen: Secondary | ICD-10-CM | POA: Diagnosis not present

## 2017-10-08 DIAGNOSIS — G9341 Metabolic encephalopathy: Secondary | ICD-10-CM | POA: Diagnosis not present

## 2017-10-08 DIAGNOSIS — F17210 Nicotine dependence, cigarettes, uncomplicated: Secondary | ICD-10-CM | POA: Diagnosis not present

## 2017-10-08 DIAGNOSIS — R2689 Other abnormalities of gait and mobility: Secondary | ICD-10-CM | POA: Diagnosis not present

## 2017-10-08 DIAGNOSIS — Z86711 Personal history of pulmonary embolism: Secondary | ICD-10-CM | POA: Diagnosis not present

## 2017-10-08 DIAGNOSIS — A09 Infectious gastroenteritis and colitis, unspecified: Secondary | ICD-10-CM | POA: Diagnosis not present

## 2017-10-08 DIAGNOSIS — F6389 Other impulse disorders: Secondary | ICD-10-CM | POA: Diagnosis not present

## 2017-10-08 DIAGNOSIS — I429 Cardiomyopathy, unspecified: Secondary | ICD-10-CM | POA: Diagnosis not present

## 2017-10-08 DIAGNOSIS — Z7901 Long term (current) use of anticoagulants: Secondary | ICD-10-CM | POA: Diagnosis not present

## 2017-10-08 DIAGNOSIS — F200 Paranoid schizophrenia: Secondary | ICD-10-CM | POA: Diagnosis not present

## 2017-10-08 DIAGNOSIS — I272 Pulmonary hypertension, unspecified: Secondary | ICD-10-CM | POA: Diagnosis not present

## 2017-10-08 DIAGNOSIS — I10 Essential (primary) hypertension: Secondary | ICD-10-CM | POA: Diagnosis not present

## 2017-10-10 DIAGNOSIS — N6320 Unspecified lump in the left breast, unspecified quadrant: Secondary | ICD-10-CM | POA: Diagnosis not present

## 2017-10-10 DIAGNOSIS — I10 Essential (primary) hypertension: Secondary | ICD-10-CM | POA: Diagnosis not present

## 2017-10-10 DIAGNOSIS — Z86711 Personal history of pulmonary embolism: Secondary | ICD-10-CM | POA: Diagnosis not present

## 2017-10-10 DIAGNOSIS — E871 Hypo-osmolality and hyponatremia: Secondary | ICD-10-CM | POA: Diagnosis not present

## 2017-10-10 DIAGNOSIS — F209 Schizophrenia, unspecified: Secondary | ICD-10-CM | POA: Diagnosis not present

## 2017-10-13 DIAGNOSIS — A09 Infectious gastroenteritis and colitis, unspecified: Secondary | ICD-10-CM | POA: Diagnosis not present

## 2017-10-13 DIAGNOSIS — F200 Paranoid schizophrenia: Secondary | ICD-10-CM | POA: Diagnosis not present

## 2017-10-13 DIAGNOSIS — G9341 Metabolic encephalopathy: Secondary | ICD-10-CM | POA: Diagnosis not present

## 2017-10-13 DIAGNOSIS — I10 Essential (primary) hypertension: Secondary | ICD-10-CM | POA: Diagnosis not present

## 2017-10-13 DIAGNOSIS — R2689 Other abnormalities of gait and mobility: Secondary | ICD-10-CM | POA: Diagnosis not present

## 2017-10-13 DIAGNOSIS — I429 Cardiomyopathy, unspecified: Secondary | ICD-10-CM | POA: Diagnosis not present

## 2017-10-15 DIAGNOSIS — G9341 Metabolic encephalopathy: Secondary | ICD-10-CM | POA: Diagnosis not present

## 2017-10-15 DIAGNOSIS — R2689 Other abnormalities of gait and mobility: Secondary | ICD-10-CM | POA: Diagnosis not present

## 2017-10-15 DIAGNOSIS — I429 Cardiomyopathy, unspecified: Secondary | ICD-10-CM | POA: Diagnosis not present

## 2017-10-15 DIAGNOSIS — F200 Paranoid schizophrenia: Secondary | ICD-10-CM | POA: Diagnosis not present

## 2017-10-15 DIAGNOSIS — I10 Essential (primary) hypertension: Secondary | ICD-10-CM | POA: Diagnosis not present

## 2017-10-15 DIAGNOSIS — A09 Infectious gastroenteritis and colitis, unspecified: Secondary | ICD-10-CM | POA: Diagnosis not present

## 2017-10-16 ENCOUNTER — Other Ambulatory Visit (HOSPITAL_COMMUNITY): Payer: Self-pay | Admitting: Family Medicine

## 2017-10-16 ENCOUNTER — Other Ambulatory Visit (HOSPITAL_COMMUNITY): Payer: Self-pay

## 2017-10-16 DIAGNOSIS — IMO0002 Reserved for concepts with insufficient information to code with codable children: Secondary | ICD-10-CM

## 2017-10-16 DIAGNOSIS — R229 Localized swelling, mass and lump, unspecified: Principal | ICD-10-CM

## 2017-10-21 ENCOUNTER — Ambulatory Visit (HOSPITAL_COMMUNITY)
Admission: RE | Admit: 2017-10-21 | Discharge: 2017-10-21 | Disposition: A | Discharge status: Home / Self Care | Payer: Medicare Other | Source: Ambulatory Visit | Attending: Family Medicine | Admitting: Family Medicine

## 2017-10-21 DIAGNOSIS — G9341 Metabolic encephalopathy: Secondary | ICD-10-CM | POA: Diagnosis not present

## 2017-10-21 DIAGNOSIS — R229 Localized swelling, mass and lump, unspecified: Principal | ICD-10-CM

## 2017-10-21 DIAGNOSIS — F200 Paranoid schizophrenia: Secondary | ICD-10-CM | POA: Diagnosis not present

## 2017-10-21 DIAGNOSIS — I10 Essential (primary) hypertension: Secondary | ICD-10-CM | POA: Diagnosis not present

## 2017-10-21 DIAGNOSIS — I429 Cardiomyopathy, unspecified: Secondary | ICD-10-CM | POA: Diagnosis not present

## 2017-10-21 DIAGNOSIS — IMO0002 Reserved for concepts with insufficient information to code with codable children: Secondary | ICD-10-CM

## 2017-10-21 DIAGNOSIS — R2689 Other abnormalities of gait and mobility: Secondary | ICD-10-CM | POA: Diagnosis not present

## 2017-10-21 DIAGNOSIS — A09 Infectious gastroenteritis and colitis, unspecified: Secondary | ICD-10-CM | POA: Diagnosis not present

## 2017-10-21 DIAGNOSIS — N6322 Unspecified lump in the left breast, upper inner quadrant: Secondary | ICD-10-CM | POA: Diagnosis not present

## 2017-10-21 DIAGNOSIS — N6320 Unspecified lump in the left breast, unspecified quadrant: Secondary | ICD-10-CM | POA: Diagnosis present

## 2017-10-21 DIAGNOSIS — R922 Inconclusive mammogram: Secondary | ICD-10-CM | POA: Diagnosis not present

## 2017-10-23 DIAGNOSIS — I429 Cardiomyopathy, unspecified: Secondary | ICD-10-CM | POA: Diagnosis not present

## 2017-10-23 DIAGNOSIS — R2689 Other abnormalities of gait and mobility: Secondary | ICD-10-CM | POA: Diagnosis not present

## 2017-10-23 DIAGNOSIS — F200 Paranoid schizophrenia: Secondary | ICD-10-CM | POA: Diagnosis not present

## 2017-10-23 DIAGNOSIS — G9341 Metabolic encephalopathy: Secondary | ICD-10-CM | POA: Diagnosis not present

## 2017-10-23 DIAGNOSIS — A09 Infectious gastroenteritis and colitis, unspecified: Secondary | ICD-10-CM | POA: Diagnosis not present

## 2017-10-23 DIAGNOSIS — I10 Essential (primary) hypertension: Secondary | ICD-10-CM | POA: Diagnosis not present

## 2017-10-27 DIAGNOSIS — A09 Infectious gastroenteritis and colitis, unspecified: Secondary | ICD-10-CM | POA: Diagnosis not present

## 2017-10-27 DIAGNOSIS — R2689 Other abnormalities of gait and mobility: Secondary | ICD-10-CM | POA: Diagnosis not present

## 2017-10-27 DIAGNOSIS — I429 Cardiomyopathy, unspecified: Secondary | ICD-10-CM | POA: Diagnosis not present

## 2017-10-27 DIAGNOSIS — G9341 Metabolic encephalopathy: Secondary | ICD-10-CM | POA: Diagnosis not present

## 2017-10-27 DIAGNOSIS — F200 Paranoid schizophrenia: Secondary | ICD-10-CM | POA: Diagnosis not present

## 2017-10-27 DIAGNOSIS — I10 Essential (primary) hypertension: Secondary | ICD-10-CM | POA: Diagnosis not present

## 2017-10-30 DIAGNOSIS — F200 Paranoid schizophrenia: Secondary | ICD-10-CM | POA: Diagnosis not present

## 2017-10-30 DIAGNOSIS — I429 Cardiomyopathy, unspecified: Secondary | ICD-10-CM | POA: Diagnosis not present

## 2017-10-30 DIAGNOSIS — I10 Essential (primary) hypertension: Secondary | ICD-10-CM | POA: Diagnosis not present

## 2017-10-30 DIAGNOSIS — G9341 Metabolic encephalopathy: Secondary | ICD-10-CM | POA: Diagnosis not present

## 2017-10-30 DIAGNOSIS — R2689 Other abnormalities of gait and mobility: Secondary | ICD-10-CM | POA: Diagnosis not present

## 2017-10-30 DIAGNOSIS — A09 Infectious gastroenteritis and colitis, unspecified: Secondary | ICD-10-CM | POA: Diagnosis not present

## 2017-11-04 DIAGNOSIS — T148XXA Other injury of unspecified body region, initial encounter: Secondary | ICD-10-CM | POA: Diagnosis not present

## 2017-11-04 DIAGNOSIS — E871 Hypo-osmolality and hyponatremia: Secondary | ICD-10-CM | POA: Diagnosis not present

## 2017-11-04 DIAGNOSIS — J439 Emphysema, unspecified: Secondary | ICD-10-CM | POA: Diagnosis not present

## 2017-11-04 DIAGNOSIS — I10 Essential (primary) hypertension: Secondary | ICD-10-CM | POA: Diagnosis not present

## 2017-11-07 DIAGNOSIS — F209 Schizophrenia, unspecified: Secondary | ICD-10-CM | POA: Diagnosis not present

## 2018-01-15 DIAGNOSIS — F209 Schizophrenia, unspecified: Secondary | ICD-10-CM | POA: Diagnosis not present

## 2018-01-29 DIAGNOSIS — F209 Schizophrenia, unspecified: Secondary | ICD-10-CM | POA: Diagnosis not present

## 2018-05-06 DIAGNOSIS — I10 Essential (primary) hypertension: Secondary | ICD-10-CM | POA: Diagnosis not present

## 2018-05-06 DIAGNOSIS — Z23 Encounter for immunization: Secondary | ICD-10-CM | POA: Diagnosis not present

## 2018-05-06 DIAGNOSIS — R739 Hyperglycemia, unspecified: Secondary | ICD-10-CM | POA: Diagnosis not present

## 2018-05-20 DIAGNOSIS — F209 Schizophrenia, unspecified: Secondary | ICD-10-CM | POA: Diagnosis not present

## 2018-08-12 DIAGNOSIS — F209 Schizophrenia, unspecified: Secondary | ICD-10-CM | POA: Diagnosis not present

## 2018-08-27 DIAGNOSIS — F209 Schizophrenia, unspecified: Secondary | ICD-10-CM | POA: Diagnosis not present

## 2018-10-12 DIAGNOSIS — M2041 Other hammer toe(s) (acquired), right foot: Secondary | ICD-10-CM | POA: Diagnosis not present

## 2018-10-12 DIAGNOSIS — B351 Tinea unguium: Secondary | ICD-10-CM | POA: Diagnosis not present

## 2018-10-12 DIAGNOSIS — M79675 Pain in left toe(s): Secondary | ICD-10-CM | POA: Diagnosis not present

## 2018-10-12 DIAGNOSIS — I739 Peripheral vascular disease, unspecified: Secondary | ICD-10-CM | POA: Diagnosis not present

## 2018-10-13 DIAGNOSIS — L272 Dermatitis due to ingested food: Secondary | ICD-10-CM | POA: Diagnosis not present

## 2018-10-13 DIAGNOSIS — I8310 Varicose veins of unspecified lower extremity with inflammation: Secondary | ICD-10-CM | POA: Diagnosis not present

## 2018-10-13 DIAGNOSIS — W19XXXA Unspecified fall, initial encounter: Secondary | ICD-10-CM | POA: Diagnosis not present

## 2018-11-11 DIAGNOSIS — I2699 Other pulmonary embolism without acute cor pulmonale: Secondary | ICD-10-CM | POA: Diagnosis not present

## 2018-11-11 DIAGNOSIS — F209 Schizophrenia, unspecified: Secondary | ICD-10-CM | POA: Diagnosis not present

## 2018-11-11 DIAGNOSIS — I10 Essential (primary) hypertension: Secondary | ICD-10-CM | POA: Diagnosis not present

## 2018-11-30 DIAGNOSIS — F209 Schizophrenia, unspecified: Secondary | ICD-10-CM | POA: Diagnosis not present

## 2019-03-01 DIAGNOSIS — F209 Schizophrenia, unspecified: Secondary | ICD-10-CM | POA: Diagnosis not present

## 2019-04-23 ENCOUNTER — Other Ambulatory Visit (HOSPITAL_COMMUNITY): Payer: Self-pay | Admitting: Family Medicine

## 2019-04-23 DIAGNOSIS — Z1231 Encounter for screening mammogram for malignant neoplasm of breast: Secondary | ICD-10-CM

## 2019-05-21 DIAGNOSIS — I10 Essential (primary) hypertension: Secondary | ICD-10-CM | POA: Diagnosis not present

## 2019-05-21 DIAGNOSIS — Z23 Encounter for immunization: Secondary | ICD-10-CM | POA: Diagnosis not present

## 2019-05-21 DIAGNOSIS — F209 Schizophrenia, unspecified: Secondary | ICD-10-CM | POA: Diagnosis not present

## 2019-05-21 DIAGNOSIS — J439 Emphysema, unspecified: Secondary | ICD-10-CM | POA: Diagnosis not present

## 2019-05-21 DIAGNOSIS — Z79899 Other long term (current) drug therapy: Secondary | ICD-10-CM | POA: Diagnosis not present

## 2019-05-21 DIAGNOSIS — F319 Bipolar disorder, unspecified: Secondary | ICD-10-CM | POA: Diagnosis not present

## 2019-05-24 DIAGNOSIS — F209 Schizophrenia, unspecified: Secondary | ICD-10-CM | POA: Diagnosis not present

## 2019-05-27 DIAGNOSIS — F209 Schizophrenia, unspecified: Secondary | ICD-10-CM | POA: Diagnosis not present

## 2019-08-17 DIAGNOSIS — F209 Schizophrenia, unspecified: Secondary | ICD-10-CM | POA: Diagnosis not present

## 2019-08-23 DIAGNOSIS — Z23 Encounter for immunization: Secondary | ICD-10-CM | POA: Diagnosis not present

## 2019-09-20 DIAGNOSIS — Z23 Encounter for immunization: Secondary | ICD-10-CM | POA: Diagnosis not present

## 2019-11-08 DIAGNOSIS — F209 Schizophrenia, unspecified: Secondary | ICD-10-CM | POA: Diagnosis not present

## 2019-11-08 DIAGNOSIS — F17201 Nicotine dependence, unspecified, in remission: Secondary | ICD-10-CM | POA: Diagnosis not present

## 2019-11-16 DIAGNOSIS — Z9109 Other allergy status, other than to drugs and biological substances: Secondary | ICD-10-CM | POA: Diagnosis not present

## 2019-11-16 DIAGNOSIS — F209 Schizophrenia, unspecified: Secondary | ICD-10-CM | POA: Diagnosis not present

## 2019-11-16 DIAGNOSIS — I2699 Other pulmonary embolism without acute cor pulmonale: Secondary | ICD-10-CM | POA: Diagnosis not present

## 2019-11-16 DIAGNOSIS — I10 Essential (primary) hypertension: Secondary | ICD-10-CM | POA: Diagnosis not present

## 2019-11-16 DIAGNOSIS — I8310 Varicose veins of unspecified lower extremity with inflammation: Secondary | ICD-10-CM | POA: Diagnosis not present

## 2019-11-16 DIAGNOSIS — J439 Emphysema, unspecified: Secondary | ICD-10-CM | POA: Diagnosis not present

## 2019-11-23 ENCOUNTER — Other Ambulatory Visit (HOSPITAL_COMMUNITY): Payer: Self-pay | Admitting: Family Medicine

## 2019-11-23 DIAGNOSIS — Z09 Encounter for follow-up examination after completed treatment for conditions other than malignant neoplasm: Secondary | ICD-10-CM

## 2019-11-26 ENCOUNTER — Other Ambulatory Visit (HOSPITAL_COMMUNITY): Payer: Self-pay | Admitting: Family Medicine

## 2019-11-26 DIAGNOSIS — Z124 Encounter for screening for malignant neoplasm of cervix: Secondary | ICD-10-CM | POA: Diagnosis not present

## 2019-11-26 DIAGNOSIS — N950 Postmenopausal bleeding: Secondary | ICD-10-CM | POA: Diagnosis not present

## 2019-11-26 DIAGNOSIS — Z09 Encounter for follow-up examination after completed treatment for conditions other than malignant neoplasm: Secondary | ICD-10-CM

## 2019-12-14 ENCOUNTER — Ambulatory Visit (HOSPITAL_COMMUNITY)
Admission: RE | Admit: 2019-12-14 | Discharge: 2019-12-14 | Disposition: A | Discharge status: Home / Self Care | Payer: Medicare Other | Source: Ambulatory Visit | Attending: Family Medicine | Admitting: Family Medicine

## 2019-12-14 ENCOUNTER — Other Ambulatory Visit: Payer: Self-pay

## 2019-12-14 DIAGNOSIS — Z09 Encounter for follow-up examination after completed treatment for conditions other than malignant neoplasm: Secondary | ICD-10-CM | POA: Insufficient documentation

## 2019-12-14 DIAGNOSIS — N6322 Unspecified lump in the left breast, upper inner quadrant: Secondary | ICD-10-CM | POA: Diagnosis not present

## 2019-12-14 DIAGNOSIS — R922 Inconclusive mammogram: Secondary | ICD-10-CM | POA: Diagnosis not present

## 2020-01-19 ENCOUNTER — Other Ambulatory Visit: Payer: Self-pay | Admitting: Obstetrics & Gynecology

## 2020-01-19 DIAGNOSIS — N950 Postmenopausal bleeding: Secondary | ICD-10-CM

## 2020-01-28 ENCOUNTER — Ambulatory Visit
Admission: RE | Admit: 2020-01-28 | Discharge: 2020-01-28 | Disposition: A | Discharge status: Home / Self Care | Payer: Medicare Other | Source: Ambulatory Visit | Attending: Obstetrics & Gynecology | Admitting: Obstetrics & Gynecology

## 2020-01-28 ENCOUNTER — Other Ambulatory Visit: Payer: Self-pay

## 2020-01-28 DIAGNOSIS — N950 Postmenopausal bleeding: Secondary | ICD-10-CM

## 2020-02-03 DIAGNOSIS — F17201 Nicotine dependence, unspecified, in remission: Secondary | ICD-10-CM | POA: Diagnosis not present

## 2020-02-03 DIAGNOSIS — F209 Schizophrenia, unspecified: Secondary | ICD-10-CM | POA: Diagnosis not present

## 2020-02-29 DIAGNOSIS — F209 Schizophrenia, unspecified: Secondary | ICD-10-CM | POA: Diagnosis not present

## 2020-03-09 ENCOUNTER — Other Ambulatory Visit: Payer: Self-pay | Admitting: Obstetrics & Gynecology

## 2020-03-09 DIAGNOSIS — N950 Postmenopausal bleeding: Secondary | ICD-10-CM | POA: Diagnosis not present

## 2020-03-09 DIAGNOSIS — N719 Inflammatory disease of uterus, unspecified: Secondary | ICD-10-CM | POA: Diagnosis not present

## 2020-04-27 DIAGNOSIS — F17201 Nicotine dependence, unspecified, in remission: Secondary | ICD-10-CM | POA: Diagnosis not present

## 2020-04-27 DIAGNOSIS — F209 Schizophrenia, unspecified: Secondary | ICD-10-CM | POA: Diagnosis not present

## 2020-05-16 DIAGNOSIS — J439 Emphysema, unspecified: Secondary | ICD-10-CM | POA: Diagnosis not present

## 2020-05-16 DIAGNOSIS — Z9109 Other allergy status, other than to drugs and biological substances: Secondary | ICD-10-CM | POA: Diagnosis not present

## 2020-05-16 DIAGNOSIS — F209 Schizophrenia, unspecified: Secondary | ICD-10-CM | POA: Diagnosis not present

## 2020-05-16 DIAGNOSIS — I10 Essential (primary) hypertension: Secondary | ICD-10-CM | POA: Diagnosis not present

## 2020-06-21 DIAGNOSIS — Z23 Encounter for immunization: Secondary | ICD-10-CM | POA: Diagnosis not present

## 2020-09-08 DIAGNOSIS — F209 Schizophrenia, unspecified: Secondary | ICD-10-CM | POA: Diagnosis not present

## 2020-09-08 DIAGNOSIS — G4700 Insomnia, unspecified: Secondary | ICD-10-CM | POA: Diagnosis not present

## 2020-09-14 DIAGNOSIS — F209 Schizophrenia, unspecified: Secondary | ICD-10-CM | POA: Diagnosis not present

## 2020-11-14 DIAGNOSIS — Z9109 Other allergy status, other than to drugs and biological substances: Secondary | ICD-10-CM | POA: Diagnosis not present

## 2020-11-14 DIAGNOSIS — I10 Essential (primary) hypertension: Secondary | ICD-10-CM | POA: Diagnosis not present

## 2020-11-14 DIAGNOSIS — F209 Schizophrenia, unspecified: Secondary | ICD-10-CM | POA: Diagnosis not present

## 2020-11-14 DIAGNOSIS — J439 Emphysema, unspecified: Secondary | ICD-10-CM | POA: Diagnosis not present

## 2020-11-14 DIAGNOSIS — R739 Hyperglycemia, unspecified: Secondary | ICD-10-CM | POA: Diagnosis not present

## 2020-12-07 DIAGNOSIS — F209 Schizophrenia, unspecified: Secondary | ICD-10-CM | POA: Diagnosis not present

## 2020-12-15 DIAGNOSIS — G4700 Insomnia, unspecified: Secondary | ICD-10-CM | POA: Diagnosis not present

## 2020-12-15 DIAGNOSIS — F209 Schizophrenia, unspecified: Secondary | ICD-10-CM | POA: Diagnosis not present

## 2021-02-28 DIAGNOSIS — F17201 Nicotine dependence, unspecified, in remission: Secondary | ICD-10-CM | POA: Diagnosis not present

## 2021-02-28 DIAGNOSIS — G4700 Insomnia, unspecified: Secondary | ICD-10-CM | POA: Diagnosis not present

## 2021-02-28 DIAGNOSIS — F209 Schizophrenia, unspecified: Secondary | ICD-10-CM | POA: Diagnosis not present

## 2021-03-12 DIAGNOSIS — Z01419 Encounter for gynecological examination (general) (routine) without abnormal findings: Secondary | ICD-10-CM | POA: Diagnosis not present

## 2021-03-12 DIAGNOSIS — N950 Postmenopausal bleeding: Secondary | ICD-10-CM | POA: Diagnosis not present

## 2021-04-30 DIAGNOSIS — Z23 Encounter for immunization: Secondary | ICD-10-CM | POA: Diagnosis not present

## 2021-05-01 DIAGNOSIS — N950 Postmenopausal bleeding: Secondary | ICD-10-CM | POA: Diagnosis not present

## 2021-05-18 DIAGNOSIS — F319 Bipolar disorder, unspecified: Secondary | ICD-10-CM | POA: Diagnosis not present

## 2021-05-18 DIAGNOSIS — R059 Cough, unspecified: Secondary | ICD-10-CM | POA: Diagnosis not present

## 2021-05-18 DIAGNOSIS — I10 Essential (primary) hypertension: Secondary | ICD-10-CM | POA: Diagnosis not present

## 2021-05-18 DIAGNOSIS — Z23 Encounter for immunization: Secondary | ICD-10-CM | POA: Diagnosis not present

## 2021-05-18 DIAGNOSIS — I2699 Other pulmonary embolism without acute cor pulmonale: Secondary | ICD-10-CM | POA: Diagnosis not present

## 2021-05-18 DIAGNOSIS — F209 Schizophrenia, unspecified: Secondary | ICD-10-CM | POA: Diagnosis not present

## 2021-05-18 DIAGNOSIS — Z9109 Other allergy status, other than to drugs and biological substances: Secondary | ICD-10-CM | POA: Diagnosis not present

## 2021-07-03 DIAGNOSIS — G4700 Insomnia, unspecified: Secondary | ICD-10-CM | POA: Diagnosis not present

## 2021-07-03 DIAGNOSIS — F209 Schizophrenia, unspecified: Secondary | ICD-10-CM | POA: Diagnosis not present

## 2021-11-26 ENCOUNTER — Other Ambulatory Visit (HOSPITAL_COMMUNITY): Payer: Self-pay | Admitting: Family Medicine

## 2021-11-26 DIAGNOSIS — J439 Emphysema, unspecified: Secondary | ICD-10-CM

## 2021-11-27 ENCOUNTER — Other Ambulatory Visit: Payer: Self-pay | Admitting: Family Medicine

## 2021-11-27 DIAGNOSIS — N6320 Unspecified lump in the left breast, unspecified quadrant: Secondary | ICD-10-CM

## 2021-11-28 ENCOUNTER — Other Ambulatory Visit (HOSPITAL_COMMUNITY): Payer: Self-pay | Admitting: Family Medicine

## 2021-11-28 DIAGNOSIS — N6320 Unspecified lump in the left breast, unspecified quadrant: Secondary | ICD-10-CM

## 2021-12-20 ENCOUNTER — Ambulatory Visit (HOSPITAL_COMMUNITY)
Admission: RE | Admit: 2021-12-20 | Discharge: 2021-12-20 | Disposition: A | Discharge status: Home / Self Care | Payer: Medicare Other | Source: Ambulatory Visit | Attending: Family Medicine | Admitting: Family Medicine

## 2021-12-20 DIAGNOSIS — N6320 Unspecified lump in the left breast, unspecified quadrant: Secondary | ICD-10-CM

## 2021-12-20 DIAGNOSIS — N6322 Unspecified lump in the left breast, upper inner quadrant: Secondary | ICD-10-CM | POA: Diagnosis not present

## 2022-02-08 DIAGNOSIS — J449 Chronic obstructive pulmonary disease, unspecified: Secondary | ICD-10-CM | POA: Insufficient documentation

## 2022-02-08 DIAGNOSIS — F259 Schizoaffective disorder, unspecified: Secondary | ICD-10-CM | POA: Insufficient documentation

## 2022-02-08 DIAGNOSIS — R0902 Hypoxemia: Secondary | ICD-10-CM | POA: Insufficient documentation

## 2023-02-04 ENCOUNTER — Other Ambulatory Visit: Payer: Self-pay

## 2023-02-04 ENCOUNTER — Ambulatory Visit (HOSPITAL_COMMUNITY)
Admission: RE | Admit: 2023-02-04 | Discharge: 2023-02-04 | Disposition: A | Discharge status: Home / Self Care | Payer: Medicare Other | Source: Ambulatory Visit | Attending: Obstetrics and Gynecology | Admitting: Obstetrics and Gynecology

## 2023-02-04 DIAGNOSIS — R0602 Shortness of breath: Secondary | ICD-10-CM

## 2023-03-31 ENCOUNTER — Other Ambulatory Visit (HOSPITAL_COMMUNITY): Payer: Self-pay | Admitting: Family Medicine

## 2023-03-31 DIAGNOSIS — Z1231 Encounter for screening mammogram for malignant neoplasm of breast: Secondary | ICD-10-CM

## 2023-05-09 ENCOUNTER — Ambulatory Visit (HOSPITAL_COMMUNITY)
Admission: RE | Admit: 2023-05-09 | Discharge: 2023-05-09 | Disposition: A | Discharge status: Home / Self Care | Payer: Medicare Other | Source: Ambulatory Visit | Attending: Family Medicine | Admitting: Family Medicine

## 2023-05-09 DIAGNOSIS — Z1231 Encounter for screening mammogram for malignant neoplasm of breast: Secondary | ICD-10-CM | POA: Insufficient documentation

## 2023-06-11 ENCOUNTER — Emergency Department (HOSPITAL_COMMUNITY)
Admission: EM | Admit: 2023-06-11 | Discharge: 2023-06-11 | Discharge status: Against Medical Advice | Payer: Medicare Other | Source: Home / Self Care

## 2023-06-12 ENCOUNTER — Encounter (HOSPITAL_COMMUNITY): Payer: Self-pay | Admitting: Radiology

## 2023-06-12 ENCOUNTER — Emergency Department (HOSPITAL_COMMUNITY)
Admission: EM | Admit: 2023-06-12 | Discharge: 2023-06-12 | Disposition: A | Discharge status: Home / Self Care | Payer: Medicare Other | Attending: Emergency Medicine | Admitting: Emergency Medicine

## 2023-06-12 ENCOUNTER — Emergency Department (HOSPITAL_COMMUNITY): Payer: Medicare Other

## 2023-06-12 ENCOUNTER — Other Ambulatory Visit: Payer: Self-pay

## 2023-06-12 ENCOUNTER — Telehealth (HOSPITAL_COMMUNITY): Payer: Self-pay | Admitting: Emergency Medicine

## 2023-06-12 DIAGNOSIS — Z7901 Long term (current) use of anticoagulants: Secondary | ICD-10-CM | POA: Insufficient documentation

## 2023-06-12 DIAGNOSIS — W010XXA Fall on same level from slipping, tripping and stumbling without subsequent striking against object, initial encounter: Secondary | ICD-10-CM | POA: Insufficient documentation

## 2023-06-12 DIAGNOSIS — I10 Essential (primary) hypertension: Secondary | ICD-10-CM | POA: Diagnosis not present

## 2023-06-12 DIAGNOSIS — S52501A Unspecified fracture of the lower end of right radius, initial encounter for closed fracture: Secondary | ICD-10-CM | POA: Insufficient documentation

## 2023-06-12 DIAGNOSIS — J449 Chronic obstructive pulmonary disease, unspecified: Secondary | ICD-10-CM | POA: Insufficient documentation

## 2023-06-12 DIAGNOSIS — Z79899 Other long term (current) drug therapy: Secondary | ICD-10-CM | POA: Insufficient documentation

## 2023-06-12 DIAGNOSIS — M25531 Pain in right wrist: Secondary | ICD-10-CM | POA: Diagnosis present

## 2023-06-12 MED ORDER — HYDROCODONE-ACETAMINOPHEN 5-325 MG PO TABS: 1.0000 | ORAL_TABLET | ORAL | 0 refills | Status: DC | PRN

## 2023-06-12 NOTE — ED Notes (Signed)
Called group home [phone] Transportation ETA 30-60 mins.

## 2023-06-12 NOTE — ED Provider Notes (Signed)
[facility] AT [facility] Provider Note   CSN: [id number] Arrival date & time: 06/12/23  1347     History  Chief Complaint  Patient presents with   Wrist Pain    Nancy Erickson is a 67 y.o. female.  67 year old female presents with right wrist pain, tripped and fell landing on outstretched right hand yesterday. No other injuries.        Home Medications Prior to Admission medications   Medication Sig Start Date End Date Taking? Authorizing Provider  albuterol (PROVENTIL HFA;VENTOLIN HFA) 108 (90 Base) MCG/ACT inhaler Inhale 2 puffs into the lungs every 6 (six) hours as needed for wheezing or shortness of breath. 09/30/17   Vassie Loll, MD  amLODipine (NORVASC) 10 MG tablet Take 10 mg by mouth daily.    [provider]  benztropine (COGENTIN) 1 MG tablet Take 0.5 tablets (0.5 mg total) by mouth daily. 10/20/14   Calvert Cantor, MD  HYDROcodone-acetaminophen (NORCO/VICODIN) 5-325 MG tablet Take 1 tablet by mouth every 4 (four) hours as needed. 06/12/23   Triplett, Tammy, PA-C  loratadine (CLARITIN) 10 MG tablet Take 10 mg by mouth daily.    [provider]  LORazepam (ATIVAN) 1 MG tablet Take 1 tablet (1 mg total) by mouth every 4 (four) hours as needed for anxiety or sleep. Patient taking differently: Take 1 mg by mouth 3 (three) times daily as needed for anxiety.  10/20/14   Calvert Cantor, MD  metoprolol tartrate (LOPRESSOR) 25 MG tablet Take 25 mg by mouth 2 (two) times daily.    [provider]  pantoprazole (PROTONIX) 40 MG tablet Take 1 tablet (40 mg total) by mouth daily. 10/01/17   Vassie Loll, MD  predniSONE (DELTASONE) 20 MG tablet Take 2 tablets daily X 2 days; then 1 tablet daily X 2 days; then 1/2 tablet daily X 3 days and stop prednisone. 09/30/17   Vassie Loll, MD  risperiDONE (RISPERDAL) 3 MG tablet Take 3 mg by mouth 2 (two) times daily.    [provider]  rivaroxaban (XARELTO) 20 MG TABS  tablet Take 1 tablet (20 mg total) by mouth daily with supper. 11/08/14   Calvert Cantor, MD  Skin Protectants, Misc. (EUCERIN) cream Apply topically daily. Tac/euc 0.1% 1:4 Apply sparingly to rash on lower legs    [provider]  tiotropium (SPIRIVA HANDIHALER) 18 MCG inhalation capsule Place 1 capsule (18 mcg total) into inhaler and inhale daily. 09/30/17 09/30/18  Vassie Loll, MD  triamcinolone cream (KENALOG) 0.1 % Apply 1 application topically 2 (two) times daily.  09/28/14   [provider]      Allergies    Phosphate, Artane [trihexyphenidyl], and Codeine    Review of Systems   Review of Systems Negative except as per HPI Physical Exam Updated Vital Signs BP 123/70 (BP Location: Left Arm)   Pulse 81   Temp 98.7 F (37.1 C) (Oral)   Resp 16   Ht 5\' 4"  (1.626 m)   Wt 69.9 kg   LMP 12/29/2013   SpO2 92%   BMI 26.43 kg/m  Physical Exam Vitals and nursing note reviewed.  Constitutional:      General: She is not in acute distress.    Appearance: She is well-developed. She is not diaphoretic.  HENT:     Head: Normocephalic and atraumatic.  Cardiovascular:     Pulses: Normal pulses.  Pulmonary:     Effort: Pulmonary effort is normal.  Musculoskeletal:  General: Swelling, tenderness and signs of injury present.     Comments: TTP wrist with swelling and bruising. No pain at the right elbow. Hand mildly tender. Brisk cap refill present, no sensory deficit   Skin:    General: Skin is warm and dry.     Findings: Bruising present. No erythema or rash.  Neurological:     Mental Status: She is alert and oriented to person, place, and time.     Sensory: No sensory deficit.  Psychiatric:        Behavior: Behavior normal.     ED Results / Procedures / Treatments   Labs (all labs ordered are listed, but only abnormal results are displayed) Labs Reviewed - No data to display  EKG None  Radiology DG Wrist Complete Right  Result Date:  06/12/2023 CLINICAL DATA:  Fall. EXAM: RIGHT WRIST - COMPLETE 3+ VIEW COMPARISON:  None Available. FINDINGS: Acute transverse mildly impacted distal radial metaphyseal fracture with extension to the level of the distal radioulnar joint. Minimally displaced fracture at the base of the ulnar styloid. Subtle cortical irregularity at the waist of the scaphoid is concerning for a nondisplaced fracture. Normal carpal alignment. Diffuse soft tissue swelling of the distal forearm and wrist. No radiopaque foreign body. IMPRESSION: 1. Acute transverse mildly impacted distal radial metaphyseal fracture with extension to the level of the distal radioulnar joint. 2. Minimally displaced fracture at the base of the ulnar styloid. 3. Subtle cortical irregularity at the waist of the scaphoid is concerning for a nondisplaced fracture. A CT could be obtained for further evaluation. Electronically Signed   By: Hart Robinsons M.D.   On: 06/12/2023 16:32    Procedures Procedures    Medications Ordered in ED Medications - No data to display  ED Course/ Medical Decision Making/ A&P                                 Medical Decision Making Amount and/or Complexity of Data Reviewed Radiology: ordered.   This patient presents to the ED for concern of right wrist pain, this involves an extensive number of treatment options, and is a complaint that carries with it a high risk of complications and morbidity.  The differential diagnosis includes but not limited to sprain, fracture, dislocation    Co morbidities that complicate the patient evaluation  HTN, schizophrenia, PE, pulmonary HTN, COPD   Additional history obtained:  External records from outside source obtained and reviewed including no recent records for review. Prior admission in 2019    Imaging Studies ordered:  I ordered imaging studies including left wrist  I independently visualized and interpreted imaging which showed distal radius fracture  I  agree with the radiologist interpretation   Consultations Obtained:  I requested consultation with the ER attending, Dr. Hyacinth Meeker,  and discussed lab and imaging findings as well as pertinent plan - they recommend: splint and follow up with ortho   Problem List / ED Course / Critical interventions / Medication management  67 year old female with right wrist pain after fall onto outstretched hand after trip and fall yesterday. Dominant hand. On thinners, denies hitting head. Skin intact, no sensory or motor deficits, brisk cap refill present, skin intact. TTP generalized wrist. No other injuries. XR right wrist with distal radius fracture. Placed in sugartong splint and referred to ortho for follow up.  I have reviewed the patients home medicines and have made  adjustments as needed   Social Determinants of Health:  Has PCP   Test / Admission - Considered:  Stable for dc to follow up with orthopedics          Final Clinical Impression(s) / ED Diagnoses Final diagnoses:  Closed fracture of distal end of right radius, unspecified fracture morphology, initial encounter    Rx / DC Orders ED Discharge Orders          Ordered    HYDROcodone-acetaminophen (NORCO/VICODIN) 5-325 MG tablet  Every 4 hours PRN,   Status:  Discontinued        06/12/23 1452              Jeannie Fend, PA-C 06/12/23 1646    Eber Hong, MD 06/13/23 0700

## 2023-06-12 NOTE — ED Triage Notes (Signed)
Pt states she tripped and fell catching herself with her right hand and now she has severe right wrist pain. Wrist with bruising and notable swelling.

## 2023-06-12 NOTE — Telephone Encounter (Cosign Needed)
I was notified by nursing staff that prescription was sent to wrong pharmacy. Medication was reordered to appropriate pharmacy of record  Initial pharmacy was contacted to cancel first prescription.

## 2023-06-12 NOTE — Discharge Instructions (Signed)
Keep your splint on, clean, dry. You can elevate the arm to help reduce pain and swelling. Apply ice on top of the splint for 20 minutes at a time.  Take Tylenol as needed as directed for pain. Take Norco for pain not controlled with Tylenol. Do NOT drive if taking Norco. Norco can cause constipation, take Miralax as needed.  Follow up with orthopedics, please call to schedule an appointment for next week.

## 2023-06-12 NOTE — ED Notes (Signed)
Pt in XRAY 

## 2023-06-12 NOTE — ED Notes (Signed)
Noted O2 sats 85-86% RA Pt stated she wears O2 at night Placed pt on 2LPM O2 via [place] improved to 93% Informed PA

## 2023-06-12 NOTE — ED Notes (Signed)
Pharmacy RX changed to [facility] per request. RX cancelled in [facility]. 06/12/23 @1625 

## 2023-06-18 ENCOUNTER — Ambulatory Visit (INDEPENDENT_AMBULATORY_CARE_PROVIDER_SITE_OTHER): Payer: Medicare Other | Admitting: Orthopedic Surgery

## 2023-06-18 ENCOUNTER — Other Ambulatory Visit (INDEPENDENT_AMBULATORY_CARE_PROVIDER_SITE_OTHER): Payer: Medicare Other

## 2023-06-18 ENCOUNTER — Encounter: Payer: Self-pay | Admitting: Orthopedic Surgery

## 2023-06-18 VITALS — BP 123/70 | Ht 64.0 in | Wt 154.0 lb

## 2023-06-18 DIAGNOSIS — S52531A Colles' fracture of right radius, initial encounter for closed fracture: Secondary | ICD-10-CM

## 2023-06-18 NOTE — Progress Notes (Signed)
Office Visit Note   Patient: Nancy Erickson           Date of Birth: February 08, 1956           MRN: [id number] Visit Date: 06/18/2023 Requested by: Henrine Screws, MD [address] [place],  [place] [address] PCP: Henrine Screws, MD   Assessment & Plan:   Encounter Diagnosis  Name Primary?   Closed Colles' fracture of right radius, initial encounter Yes    No orders of the defined types were placed in this encounter.   Short arm cast applied  X-ray in a few weeks  Should be amenable to nonoperative care   Subjective: Chief Complaint  Patient presents with   Wrist Injury    Right 06/11/23    HPI: 67 year old female status post mechanical fall injured right wrist on 06/11/2023 x-rays done ER 06/12/2023 sugar-tong splint applied for distal radius fracture  Patient here for follow-up complains of pain and swelling controlled              ROS: Currently negative   Images personally read and my interpretation :    Images show a fairly nondisplaced Colles' fracture right distal radius with minimal shortening and minimal to no angulation  Repeat images at 1 week in the office see dictated report slight shortening no major change in angulation  Visit Diagnoses:  1. Closed Colles' fracture of right radius, initial encounter      Follow-Up Instructions: Return in about 29 days (around 07/17/2023) for FRACTURE CARE, XRAYS, RIGHT, WRIST.    Objective: Vital Signs: BP 123/70 Comment: 06/12/23  Ht 5\' 4"  (1.626 m)   Wt 154 lb (69.9 kg)   LMP 12/29/2013   BMI 26.43 kg/m   Physical Exam Vitals and nursing note reviewed.  Constitutional:      Appearance: Normal appearance.  HENT:     Head: Normocephalic and atraumatic.  Eyes:     General: No scleral icterus.       Right eye: No discharge.        Left eye: No discharge.     Extraocular Movements: Extraocular movements intact.     Conjunctiva/sclera: Conjunctivae normal.     Pupils: Pupils are equal, round,  and reactive to light.  Cardiovascular:     Rate and Rhythm: Normal rate.     Pulses: Normal pulses.  Skin:    General: Skin is warm and dry.     Capillary Refill: Capillary refill takes less than 2 seconds.  Neurological:     General: No focal deficit present.     Mental Status: She is alert and oriented to person, place, and time.  Psychiatric:        Mood and Affect: Mood normal.        Behavior: Behavior normal.        Thought Content: Thought content normal.        Judgment: Judgment normal.      Ortho Exam  -WRIST RIGHT  SKIN ECCHYMOSIS  TENDER WRIST  She actually has SLIGHTLY decrease in range of motion of the wrist but that is expected elbow is nontender, humerus is nontender  Specialty Comments:  No specialty comments available.  Imaging: DG Wrist Navic Only Right  Result Date: 06/18/2023 X-ray report Chief complaint possible scaphoid fracture Images scaphoid only Reading: No evidence of scaphoid fracture Impression: Normal scaphoid distal radius fracture noted with ulnar styloid fracture see other films as noted   DG Wrist Complete Right  Result Date: 06/18/2023 X-ray report Chief complaint right wrist fracture right wrist pain Images 3 views right wrist Reading: Fracture distal radius metaphyseal slight shortening minimal angulation Impression: Distal radius fracture ulnar styloid also involved by the way typical Colles' type fracture     PMFS History: Patient Active Problem List   Diagnosis Date Noted   COPD (chronic obstructive pulmonary disease) (HCC) 02/08/2022   Hypoxemia 02/08/2022   Schizo affective schizophrenia (HCC) 02/08/2022   Encounter for intubation    Gastroesophageal reflux disease    Acute respiratory failure (HCC) 09/24/2017   Acute metabolic encephalopathy 09/23/2017   Insomnia    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 No pertinent surgical history. Social History   Occupational History   Not on file  Tobacco Use   Smoking status: Former    Current packs/day: 1.00    Types: Cigarettes   Smokeless tobacco: Never  Vaping Use   Vaping status: Never Used  Substance and Sexual Activity   Alcohol use: No   Drug use: No   Sexual activity: Never

## 2023-07-05 ENCOUNTER — Emergency Department (HOSPITAL_COMMUNITY): Payer: Medicare Other

## 2023-07-05 ENCOUNTER — Other Ambulatory Visit: Payer: Self-pay

## 2023-07-05 ENCOUNTER — Inpatient Hospital Stay (HOSPITAL_COMMUNITY)
Admission: EM | Admit: 2023-07-05 | Discharge: 2023-07-09 | DRG: 189 | Disposition: A | Discharge status: Nursing Home (Includes ICF) | Payer: Medicare Other | Source: Skilled Nursing Facility | Attending: Internal Medicine | Admitting: Internal Medicine

## 2023-07-05 ENCOUNTER — Encounter (HOSPITAL_COMMUNITY): Payer: Self-pay

## 2023-07-05 DIAGNOSIS — K219 Gastro-esophageal reflux disease without esophagitis: Secondary | ICD-10-CM | POA: Diagnosis present

## 2023-07-05 DIAGNOSIS — F200 Paranoid schizophrenia: Secondary | ICD-10-CM | POA: Diagnosis present

## 2023-07-05 DIAGNOSIS — Z1152 Encounter for screening for COVID-19: Secondary | ICD-10-CM

## 2023-07-05 DIAGNOSIS — Z7901 Long term (current) use of anticoagulants: Secondary | ICD-10-CM | POA: Diagnosis not present

## 2023-07-05 DIAGNOSIS — G9341 Metabolic encephalopathy: Secondary | ICD-10-CM | POA: Diagnosis present

## 2023-07-05 DIAGNOSIS — I429 Cardiomyopathy, unspecified: Secondary | ICD-10-CM | POA: Diagnosis present

## 2023-07-05 DIAGNOSIS — I5032 Chronic diastolic (congestive) heart failure: Secondary | ICD-10-CM | POA: Diagnosis present

## 2023-07-05 DIAGNOSIS — J9622 Acute and chronic respiratory failure with hypercapnia: Secondary | ICD-10-CM | POA: Diagnosis present

## 2023-07-05 DIAGNOSIS — R0902 Hypoxemia: Principal | ICD-10-CM

## 2023-07-05 DIAGNOSIS — Z91018 Allergy to other foods: Secondary | ICD-10-CM | POA: Diagnosis not present

## 2023-07-05 DIAGNOSIS — Z87891 Personal history of nicotine dependence: Secondary | ICD-10-CM

## 2023-07-05 DIAGNOSIS — Z86711 Personal history of pulmonary embolism: Secondary | ICD-10-CM

## 2023-07-05 DIAGNOSIS — J449 Chronic obstructive pulmonary disease, unspecified: Secondary | ICD-10-CM | POA: Diagnosis present

## 2023-07-05 DIAGNOSIS — I272 Pulmonary hypertension, unspecified: Secondary | ICD-10-CM | POA: Diagnosis present

## 2023-07-05 DIAGNOSIS — Z91128 Patient's intentional underdosing of medication regimen for other reason: Secondary | ICD-10-CM | POA: Diagnosis not present

## 2023-07-05 DIAGNOSIS — Z79899 Other long term (current) drug therapy: Secondary | ICD-10-CM

## 2023-07-05 DIAGNOSIS — J918 Pleural effusion in other conditions classified elsewhere: Secondary | ICD-10-CM | POA: Diagnosis present

## 2023-07-05 DIAGNOSIS — F54 Psychological and behavioral factors associated with disorders or diseases classified elsewhere: Secondary | ICD-10-CM | POA: Diagnosis present

## 2023-07-05 DIAGNOSIS — J9621 Acute and chronic respiratory failure with hypoxia: Secondary | ICD-10-CM | POA: Diagnosis not present

## 2023-07-05 DIAGNOSIS — Z885 Allergy status to narcotic agent status: Secondary | ICD-10-CM

## 2023-07-05 DIAGNOSIS — I110 Hypertensive heart disease with heart failure: Secondary | ICD-10-CM | POA: Diagnosis present

## 2023-07-05 DIAGNOSIS — Z888 Allergy status to other drugs, medicaments and biological substances status: Secondary | ICD-10-CM

## 2023-07-05 DIAGNOSIS — E871 Hypo-osmolality and hyponatremia: Secondary | ICD-10-CM | POA: Diagnosis present

## 2023-07-05 DIAGNOSIS — J441 Chronic obstructive pulmonary disease with (acute) exacerbation: Secondary | ICD-10-CM | POA: Diagnosis present

## 2023-07-05 DIAGNOSIS — R0602 Shortness of breath: Secondary | ICD-10-CM | POA: Diagnosis present

## 2023-07-05 DIAGNOSIS — J439 Emphysema, unspecified: Secondary | ICD-10-CM | POA: Diagnosis present

## 2023-07-05 DIAGNOSIS — T415X6A Underdosing of therapeutic gases, initial encounter: Secondary | ICD-10-CM | POA: Diagnosis present

## 2023-07-05 DIAGNOSIS — I10 Essential (primary) hypertension: Secondary | ICD-10-CM | POA: Diagnosis present

## 2023-07-05 DIAGNOSIS — J40 Bronchitis, not specified as acute or chronic: Secondary | ICD-10-CM | POA: Diagnosis present

## 2023-07-05 DIAGNOSIS — R4182 Altered mental status, unspecified: Secondary | ICD-10-CM | POA: Diagnosis present

## 2023-07-05 DIAGNOSIS — R631 Polydipsia: Secondary | ICD-10-CM | POA: Diagnosis present

## 2023-07-05 DIAGNOSIS — R0609 Other forms of dyspnea: Secondary | ICD-10-CM | POA: Diagnosis not present

## 2023-07-05 LAB — BASIC METABOLIC PANEL
Anion gap: 8 (ref 5–15)
BUN: 10 mg/dL (ref 8–23)
CO2: 33 mmol/L — ABNORMAL HIGH (ref 22–32)
Calcium: 8.3 mg/dL — ABNORMAL LOW (ref 8.9–10.3)
Chloride: 78 mmol/L — ABNORMAL LOW (ref 98–111)
Creatinine, Ser: 0.43 mg/dL — ABNORMAL LOW (ref 0.44–1.00)
GFR, Estimated: >60 mL/min (ref >60)
Glucose, Bld: 126 mg/dL — ABNORMAL HIGH (ref 70–99)
Potassium: 4 mmol/L (ref 3.5–5.1)
Sodium: 119 mmol/L — CL (ref 135–145)

## 2023-07-05 LAB — OSMOLALITY: Osmolality: 257 mosm/kg — ABNORMAL LOW (ref 275–295)

## 2023-07-05 LAB — LACTIC ACID, PLASMA
Lactic Acid, Venous: 1.3 mmol/L (ref 0.5–1.9)
Lactic Acid, Venous: 0.7 mmol/L (ref 0.5–1.9)

## 2023-07-05 LAB — BLOOD GAS, ARTERIAL
Acid-Base Excess: 8 mmol/L — ABNORMAL HIGH (ref 0.0–2.0)
Acid-Base Excess: 10.6 mmol/L — ABNORMAL HIGH (ref 0.0–2.0)
Bicarbonate: 37.4 mmol/L — ABNORMAL HIGH (ref 20.0–28.0)
Bicarbonate: 40.9 mmol/L — ABNORMAL HIGH (ref 20.0–28.0)
Drawn by: 22179
Drawn by: 22223
FIO2: 40 %
O2 Saturation: 94.5 %
O2 Saturation: 96.5 %
Patient temperature: 36.7
Patient temperature: 37
pCO2 arterial: 75 mm[Hg] (ref 32–48)
pCO2 arterial: 85 mm[Hg] (ref 32–48)
pH, Arterial: 7.3 — ABNORMAL LOW (ref 7.35–7.45)
pH, Arterial: 7.29 — ABNORMAL LOW (ref 7.35–7.45)
pO2, Arterial: 66 mm[Hg] — ABNORMAL LOW (ref 83–108)
pO2, Arterial: 75 mm[Hg] — ABNORMAL LOW (ref 83–108)

## 2023-07-05 LAB — RESP PANEL BY RT-PCR (RSV, FLU A&B, COVID)  RVPGX2
Influenza A by PCR: NEGATIVE
Influenza B by PCR: NEGATIVE
Resp Syncytial Virus by PCR: NEGATIVE
SARS Coronavirus 2 by RT PCR: NEGATIVE

## 2023-07-05 LAB — TROPONIN I (HIGH SENSITIVITY): Troponin I (High Sensitivity): 5 ng/L (ref <18)

## 2023-07-05 LAB — BRAIN NATRIURETIC PEPTIDE: B Natriuretic Peptide: 113 pg/mL — ABNORMAL HIGH (ref 0.0–100.0)

## 2023-07-05 LAB — SODIUM, URINE, RANDOM: Sodium, Ur: <10 mmol/L

## 2023-07-05 LAB — OSMOLALITY, URINE: Osmolality, Ur: 356 mosm/kg (ref 300–900)

## 2023-07-05 LAB — MRSA NEXT GEN BY PCR, NASAL: MRSA by PCR Next Gen: NOT DETECTED

## 2023-07-05 LAB — SODIUM: Sodium: 123 mmol/L — ABNORMAL LOW (ref 135–145)

## 2023-07-05 MED ORDER — ONDANSETRON HCL 4 MG/2ML IJ SOLN: 4.0000 mg | Freq: Four times a day (QID) | INTRAMUSCULAR | Status: DC | PRN

## 2023-07-05 MED ORDER — SODIUM CHLORIDE 1 G PO TABS
1.0000 g | ORAL_TABLET | Freq: Two times a day (BID) | ORAL | Status: DC
  Administered 2023-07-05: 1 g via ORAL
  Filled 2023-07-05: qty 1

## 2023-07-05 MED ORDER — LORATADINE 10 MG PO TABS: 10.0000 mg | ORAL_TABLET | Freq: Every day | ORAL | Status: DC

## 2023-07-05 MED ORDER — ALBUTEROL SULFATE (2.5 MG/3ML) 0.083% IN NEBU: 2.5000 mg | INHALATION_SOLUTION | Freq: Four times a day (QID) | RESPIRATORY_TRACT | Status: DC | PRN

## 2023-07-05 MED ORDER — ONDANSETRON HCL 4 MG PO TABS: 4.0000 mg | ORAL_TABLET | Freq: Four times a day (QID) | ORAL | Status: DC | PRN

## 2023-07-05 MED ORDER — METOPROLOL TARTRATE 25 MG PO TABS: 25.0000 mg | ORAL_TABLET | Freq: Two times a day (BID) | ORAL | Status: DC

## 2023-07-05 MED ORDER — AMLODIPINE BESYLATE 5 MG PO TABS: 10.0000 mg | ORAL_TABLET | Freq: Every day | ORAL | Status: DC

## 2023-07-05 MED ORDER — ACETAMINOPHEN 325 MG PO TABS
650.0000 mg | ORAL_TABLET | Freq: Four times a day (QID) | ORAL | Status: DC | PRN
  Filled 2023-07-05: qty 2

## 2023-07-05 MED ORDER — PANTOPRAZOLE SODIUM 40 MG PO TBEC: 40.0000 mg | DELAYED_RELEASE_TABLET | Freq: Every day | ORAL | Status: DC

## 2023-07-05 MED ORDER — IPRATROPIUM BROMIDE 0.02 % IN SOLN: 0.5000 mg | RESPIRATORY_TRACT | Status: DC | PRN

## 2023-07-05 MED ORDER — ACETAMINOPHEN 650 MG RE SUPP: 650.0000 mg | Freq: Four times a day (QID) | RECTAL | Status: DC | PRN

## 2023-07-05 MED ORDER — RIVAROXABAN 20 MG PO TABS: 20.0000 mg | ORAL_TABLET | Freq: Every day | ORAL | Status: DC

## 2023-07-05 MED ORDER — LORAZEPAM 2 MG/ML IJ SOLN
0.5000 mg | INTRAMUSCULAR | Status: DC | PRN
  Administered 2023-07-05: 0.5 mg via INTRAVENOUS
  Filled 2023-07-05: qty 1

## 2023-07-05 MED ORDER — LORAZEPAM 2 MG/ML IJ SOLN
1.0000 mg | INTRAMUSCULAR | Status: DC | PRN
  Administered 2023-07-06 (×2): 1 mg via INTRAVENOUS
  Filled 2023-07-05 (×2): qty 1

## 2023-07-05 MED ORDER — IPRATROPIUM BROMIDE HFA 17 MCG/ACT IN AERS: 1.0000 | INHALATION_SPRAY | RESPIRATORY_TRACT | Status: DC | PRN

## 2023-07-05 MED ORDER — SODIUM CHLORIDE 0.9 % IV SOLN: INTRAVENOUS | Status: DC

## 2023-07-05 MED ORDER — IOHEXOL 300 MG/ML  SOLN
75.0000 mL | Freq: Once | INTRAMUSCULAR | Status: AC | PRN
  Administered 2023-07-05: 100 mL via INTRAVENOUS

## 2023-07-05 MED ORDER — RISPERIDONE 1 MG PO TABS
3.0000 mg | ORAL_TABLET | Freq: Two times a day (BID) | ORAL | Status: DC
  Administered 2023-07-05: 3 mg via ORAL
  Filled 2023-07-05: qty 3

## 2023-07-05 MED ORDER — IPRATROPIUM-ALBUTEROL 0.5-2.5 (3) MG/3ML IN SOLN: 3.0000 mL | RESPIRATORY_TRACT | Status: DC | PRN

## 2023-07-05 MED ORDER — METHYLPREDNISOLONE SODIUM SUCC 125 MG IJ SOLR
60.0000 mg | Freq: Two times a day (BID) | INTRAMUSCULAR | Status: DC
  Administered 2023-07-05 – 2023-07-08 (×6): 60 mg via INTRAVENOUS
  Filled 2023-07-05 (×6): qty 2

## 2023-07-05 MED ORDER — BENZTROPINE MESYLATE 1 MG PO TABS
0.5000 mg | ORAL_TABLET | Freq: Every day | ORAL | Status: DC
  Administered 2023-07-07 – 2023-07-09 (×3): 0.5 mg via ORAL
  Filled 2023-07-05 (×3): qty 1

## 2023-07-05 MED ORDER — HALOPERIDOL LACTATE 5 MG/ML IJ SOLN
5.0000 mg | Freq: Four times a day (QID) | INTRAMUSCULAR | Status: DC | PRN
  Administered 2023-07-05 – 2023-07-06 (×2): 5 mg via INTRAVENOUS
  Filled 2023-07-05 (×2): qty 1

## 2023-07-05 NOTE — ED Notes (Signed)
Pt refuses to keep bair hugger on. Rectal temp 96.7 multiple blankets placed on patient at this time.

## 2023-07-05 NOTE — H&P (Signed)
History and Physical    Patient: Nancy Erickson BJY:[id number] DOB: [birth year] DOA: 07/05/2023 DOS: the patient was seen and examined on 07/05/2023 PCP: Henrine Screws, MD  Patient coming from: ALF/ILF  Chief Complaint:  Chief Complaint  Patient presents with   Altered Mental Status   HPI: Nancy Erickson is a 66 y.o. 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  Patient is at a group home.  She is challenging to take care of him as she does not follow recommendations from her healthcare providers.  She is supposed to be on oxygen due to chronic respiratory failure and pulmonary hypertension and is also supposed to be on a fluid restricted diet, but she continues to sneak water and not wear her oxygen monitors.  She presents today with increasing confusion, shortness of breath.  She was brought to the hospital by private vehicle.  On arrival, her oxygen saturation was quite low and she had cyanotic lips.  She was immediately placed on oxygen and had rapid improvement of her oxygenation.  Per the record, no nausea, vomiting, diarrhea, constipation.  Her appetite has been normal per her report.  Review of Systems: As mentioned in the history of present illness. All other systems reviewed and are negative. Past Medical History:  Diagnosis Date   Hypertension    Hyponatremia    Paranoid schizophrenia (HCC)    Pulmonary embolus (HCC)    Venous insufficiency of right lower extremity    History reviewed. No pertinent surgical history. Social History:  reports that she has quit smoking. Her smoking use included cigarettes. She has never used smokeless tobacco. She reports that she does not drink alcohol and does not use drugs.  Allergies  Allergen Reactions   Phosphate Nausea And Vomiting   Chocolate Swelling    Facial swelling and bumps   Artane [Trihexyphenidyl]  Other (See Comments)    unknown   Codeine Other (See Comments)    unknown    Family History  Problem Relation Age of Onset   Atrial fibrillation Father     Prior to Admission medications   Medication Sig Start Date End Date Taking? Authorizing Provider  albuterol (PROVENTIL HFA;VENTOLIN HFA) 108 (90 Base) MCG/ACT inhaler Inhale 2 puffs into the lungs every 6 (six) hours as needed for wheezing or shortness of breath. 09/30/17  Yes Vassie Loll, MD  amLODipine (NORVASC) 10 MG tablet Take 10 mg by mouth daily.   Yes [provider]  ATROVENT HFA 17 MCG/ACT inhaler Inhale 1 puff into the lungs every 4 (four) hours as needed for wheezing. 06/30/23  Yes [provider]  benztropine (COGENTIN) 0.5 MG tablet Take 0.5 mg by mouth daily. 06/27/23  Yes [provider]  INVEGA TRINZA 410 MG/1. injection Inject 410 mg into the muscle every 3 (three) months. 04/17/23  Yes [provider]  loratadine (CLARITIN) 10 MG tablet Take 10 mg by mouth daily.   Yes [provider]  metoprolol tartrate (LOPRESSOR) 25 MG tablet Take 25 mg by mouth 2 (two) times daily.   Yes [provider]  OXYGEN Inhale 3 L/min into the lungs at bedtime.   Yes [provider]  pantoprazole (PROTONIX) 40 MG tablet Take 1 tablet (40 mg total) by mouth daily. 10/01/17  Yes Vassie Loll, MD  risperiDONE (RISPERDAL) 3 MG tablet Take 3 mg by mouth 2 (two) times daily.   Yes [provider]  rivaroxaban (XARELTO) 20  MG TABS tablet Take 1 tablet (20 mg total) by mouth daily with supper. 11/08/14  Yes Calvert Cantor, MD  Skin Protectants, Misc. (EUCERIN) cream Apply topically as needed for wound care. Tac/euc 0.1% 1:4 Apply sparingly to rash on lower legs   Yes [provider]  sodium chloride 1 g tablet Take 1 g by mouth 2 (two) times daily. 06/27/23  Yes [provider]  triamcinolone cream (KENALOG) 0.1 % Apply 1 application topically 2 (two)  times daily.  09/28/14  Yes [provider]    Physical Exam: Vitals:   07/05/23 1655 07/05/23 1700 07/05/23 1745 07/05/23 1815  BP: (!) 159/69 (!) 142/95 137/82 (!) 151/70  Pulse: 95 94 92 97  Resp: 17 18 17 15   Temp: (!) 96.8 F (36 C)     TempSrc: Rectal     SpO2: 95% 96% 97% 99%  Weight:      Height:       General: elderly female. Somnolent. No acute cardiopulmonary distress.  HEENT: Normocephalic atraumatic.  Right and left ears normal in appearance.  Pupils equal, round, reactive to light. Extraocular muscles are intact. Sclerae anicteric and noninjected.  Moist mucosal membranes. No mucosal lesions.  Neck: Neck supple without lymphadenopathy. No carotid bruits. No masses palpated.  Cardiovascular: Regular rate with normal S1-S2 sounds. No murmurs, rubs, gallops auscultated. No JVD.  Respiratory: Good respiratory effort with no wheezes, rales, rhonchi. Lungs clear to auscultation bilaterally.  No accessory muscle use. Abdomen: Soft, nontender, nondistended. Active bowel sounds. No masses or hepatosplenomegaly  Skin: No rashes, lesions, or ulcerations.  Dry, warm to touch. 2+ dorsalis pedis and radial pulses. Musculoskeletal: No calf or leg pain. All major joints not erythematous nontender.  No upper or lower joint deformation.  Good ROM.  No contractures  Psychiatric: Intact judgment and insight. Pleasant and cooperative. Neurologic: No focal neurological deficits. Strength is 5/5 and symmetric in upper and lower extremities.  Cranial nerves II through XII are grossly intact.  Data Reviewed: Results for orders placed or performed during the hospital encounter of 07/05/23 (from the past 24 hour(s))  Basic metabolic panel     Status: Abnormal   Collection Time: 07/05/23  1:49 PM  Result Value Ref Range   Sodium 119 (LL) 135 - 145 mmol/L   Potassium 4.0 3.5 - 5.1 mmol/L   Chloride 78 (L) 98 - 111 mmol/L   CO2 33 (H) 22 - 32 mmol/L   Glucose, Bld 126 (H) 70 - 99 mg/dL    BUN 10 8 - 23 mg/dL   Creatinine, Ser 1.61 (L) 0.44 - 1.00 mg/dL   Calcium 8.3 (L) 8.9 - 10.3 mg/dL   GFR, Estimated >09 >60 mL/min   Anion gap 8 5 - 15  Brain natriuretic peptide     Status: Abnormal   Collection Time: 07/05/23  1:49 PM  Result Value Ref Range   B Natriuretic Peptide 113.0 (H) 0.0 - 100.0 pg/mL  Troponin I (High Sensitivity)     Status: None   Collection Time: 07/05/23  1:49 PM  Result Value Ref Range   Troponin I (High Sensitivity) 5 <18 ng/L  Lactic acid, plasma     Status: None   Collection Time: 07/05/23  1:49 PM  Result Value Ref Range   Lactic Acid, Venous 1.3 0.5 - 1.9 mmol/L  Resp panel by RT-PCR (RSV, Flu A&B, Covid) Anterior Nasal Swab     Status: None   Collection Time: 07/05/23  2:36 PM  Specimen: Anterior Nasal Swab  Result Value Ref Range   SARS Coronavirus 2 by RT PCR NEGATIVE NEGATIVE   Influenza A by PCR NEGATIVE NEGATIVE   Influenza B by PCR NEGATIVE NEGATIVE   Resp Syncytial Virus by PCR NEGATIVE NEGATIVE  Lactic acid, plasma     Status: None   Collection Time: 07/05/23  3:43 PM  Result Value Ref Range   Lactic Acid, Venous 0.7 0.5 - 1.9 mmol/L  Blood gas, arterial (at [facility])     Status: Abnormal   Collection Time: 07/05/23  3:50 PM  Result Value Ref Range   FIO2 40 %   pH, Arterial 7.3 (L) 7.35 - 7.45   pCO2 arterial 75 (HH) 32 - 48 mmHg   pO2, Arterial 66 (L) 83 - 108 mmHg   Bicarbonate 37.4 (H) 20.0 - 28.0 mmol/L   Acid-Base Excess 8.0 (H) 0.0 - 2.0 mmol/L   O2 Saturation 94.5 %   Patient temperature 36.7    Collection site LEFT RADIAL    Drawn by [id number]    Allens test (pass/fail) PASS PASS  Sodium, urine, random     Status: None   Collection Time: 07/05/23  4:56 PM  Result Value Ref Range   Sodium, Ur <10 mmol/L    CT Head Wo Contrast  Result Date: 07/05/2023 CLINICAL DATA:  Mental status change, unknown cause EXAM: CT HEAD WITHOUT CONTRAST TECHNIQUE: Contiguous axial images were obtained from the base of the skull through  the vertex without intravenous contrast. RADIATION DOSE REDUCTION: This exam was performed according to the departmental dose-optimization program which includes automated exposure control, adjustment of the mA and/or kV according to patient size and/or use of iterative reconstruction technique. COMPARISON:  09/23/2017 FINDINGS: Brain: No evidence of acute infarction, hemorrhage, hydrocephalus, extra-axial collection or mass lesion/mass effect. Vascular: No hyperdense vessel or unexpected calcification. Skull: Normal. Negative for fracture or focal lesion. Sinuses/Orbits: No acute finding. Other: None. IMPRESSION: No acute intracranial findings. Electronically Signed   By: Duanne Guess D.O.   On: 07/05/2023 17:19   CT Chest W Contrast  Result Date: 07/05/2023 CLINICAL DATA:  Abnormal chest x-ray EXAM: CT CHEST WITH CONTRAST TECHNIQUE: Multidetector CT imaging of the chest was performed during intravenous contrast administration. RADIATION DOSE REDUCTION: This exam was performed according to the departmental dose-optimization program which includes automated exposure control, adjustment of the mA and/or kV according to patient size and/or use of iterative reconstruction technique. CONTRAST:  OMNIPAQUE IOHEXOL 300 MG/ML  SOLN COMPARISON:  One-view chest x-ray 07/05/2023. Two-view chest x-ray 02/04/2023. FINDINGS: Cardiovascular: The heart size is normal. Coronary artery calcifications are present. Atherosclerotic calcifications are present the aortic arch great vessel origins. Pulmonary arteries are enlarged measuring 3.1 cm on the right and 2.5 cm on the left, similar the prior studies. Mediastinum/Nodes: Breaking scratched at paratracheal lymph nodes up to 9 mm are stable from the prior study. Subcentimeter prevascular nodes are present. No hilar mass is present. Lungs/Pleura: Centrilobular emphysematous changes are present. Small bilateral pleural effusions are present. No focal nodule or mass lesion  is present. Mild dependent atelectasis is associated with the effusions. Upper Abdomen: The visualized upper abdomen is unremarkable. Musculoskeletal: No chest wall abnormality. No acute or significant osseous findings. IMPRESSION: 1. Small bilateral pleural effusions with mild dependent atelectasis. 2. No focal nodule or mass lesion. 3. Enlarged pulmonary arteries bilaterally compatible with pulmonary arterial hypertension. 4. Coronary artery disease. 5. Aortic Atherosclerosis (ICD10-I70.0) and Emphysema (ICD10-J43.9). Electronically Signed   By:  Marin Roberts M.D.   On: 07/05/2023 15:50   DG Chest Portable 1 View  Result Date: 07/05/2023 CLINICAL DATA:  hypoxia EXAM: PORTABLE CHEST 1 VIEW COMPARISON:  Chest x-ray 02/04/2023, CT chest 09/23/2017 FINDINGS: The heart and mediastinal contours are within normal limits. Question right hilar mass. Interval increase in interstitial markings. Blunting of bilateral costophrenic angles with no definite pleural effusion. No pneumothorax. No acute osseous abnormality.  Old healed right rib fractures. IMPRESSION: Question right hilar mass. Interval increase in interstitial markings. Recommend CT with intravenous contrast for further evaluation (not CTPA). Electronically Signed   By: Tish Frederickson M.D.   On: 07/05/2023 14:27     Assessment and Plan: No notes have been filed under this hospital service. Service: Hospitalist  Principal Problem:   Acute on chronic respiratory failure with hypoxia (HCC) Active Problems:   HTN (hypertension)   Hyponatremia   Altered mental state   Paranoid schizophrenia (HCC)   Psychogenic polydipsia   Gastroesophageal reflux disease   COPD (chronic obstructive pulmonary disease) (HCC)  Acute on chronic respiratory failure with hypoxia Patient had rapid improvement just on nasal cannula.  CTA appears normal without cardiopulmonary process Will continue with nasal cannula Hyponatremia secondary to psychogenic  polydipsia Serum osmolality and urine osmolality still pending. Urine sodium level is less than 10 indicating a quite dilute urine.  The patient does appear euvolemic indicative of polydipsia. Fluid restrict Serum sodium every 4 hours Altered mental status Possibly secondary to hyponatremia COPD Appears stable Paranoid schizophrenia Continue home meds GERD   Advance Care Planning:   Code Status: Full Code presumed  Consults: none  Family Communication: none  Severity of Illness: The appropriate patient status for this patient is INPATIENT. Inpatient status is judged to be reasonable and necessary in order to provide the required intensity of service to ensure the patient's safety. The patient's presenting symptoms, physical exam findings, and initial radiographic and laboratory data in the context of their chronic comorbidities is felt to place them at high risk for further clinical deterioration. Furthermore, it is not anticipated that the patient will be medically stable for discharge from the hospital within 2 midnights of admission.   * I certify that at the point of admission it is my clinical judgment that the patient will require inpatient hospital care spanning beyond 2 midnights from the point of admission due to high intensity of service, high risk for further deterioration and high frequency of surveillance required.*  Author: Levie Heritage, DO 07/05/2023 6:54 PM  For on call review [email or web address].

## 2023-07-05 NOTE — ED Triage Notes (Signed)
Pt assisted from car. Upon obtaining vital signs oxygen level is 64% on room air.

## 2023-07-05 NOTE — ED Notes (Signed)
Pt needed help getting out of the car, home health aid present.

## 2023-07-05 NOTE — Progress Notes (Signed)
Repeat ABG shows increasing hypercapnea.  Start Bipap. Patient may need sedation due to AMS/confusion Start steroids for COPD

## 2023-07-05 NOTE — ED Notes (Signed)
Pt placed on a bed alarm at this time.

## 2023-07-05 NOTE — ED Notes (Signed)
ED TO INPATIENT HANDOFF REPORT  ED Nurse Name and Phone #: Jennette Kettle [phone]  S Name/Age/Gender Rush Landmark 67 y.o. female Room/Bed: APA06/APA06  Code Status   Code Status: Prior  Home/SNF/Other [facility] Patient oriented to: self and place Is this baseline? Yes   Triage Complete: Triage complete  Chief Complaint SOB  Triage Note Pt assisted from car. Upon obtaining vital signs oxygen level is 64% on room air.   Allergies Allergies  Allergen Reactions   Phosphate Nausea And Vomiting   Chocolate Swelling    Facial swelling and bumps   Artane [Trihexyphenidyl] Other (See Comments)    unknown   Codeine Other (See Comments)    unknown    Level of Care/Admitting Diagnosis ED Disposition     ED Disposition  Admit   Condition  --   Comment  The patient appears reasonably stabilized for admission considering the current resources, flow, and capabilities available in the ED at this time, and I doubt any other [facility] requiring further screening and/or treatment in the ED prior to admission is  present.          B Medical/Surgery History Past Medical History:  Diagnosis Date   Hypertension    Hyponatremia    Paranoid schizophrenia (HCC)    Pulmonary embolus (HCC)    Venous insufficiency of right lower extremity    History reviewed. No pertinent surgical history.   A IV Location/Drains/Wounds Patient Lines/Drains/Airways Status     Active Line/Drains/Airways     Name Placement date Placement time Site Days   Peripheral IV 07/05/23 18 G Right Antecubital 07/05/23  1413  Antecubital  less than 1            Intake/Output Last 24 hours  Intake/Output Summary (Last 24 hours) at 07/05/2023 1749 Last data filed at 07/05/2023 1654 Gross per 24 hour  Intake --  Output 850 ml  Net -850 ml    Labs/Imaging Results for orders placed or performed during the hospital encounter of 07/05/23 (from the past 48 hour(s))  Basic metabolic panel      Status: Abnormal   Collection Time: 07/05/23  1:49 PM  Result Value Ref Range   Sodium 119 (LL) 135 - 145 mmol/L    Comment: CRITICAL RESULT CALLED TO, READ BACK BY AND VERIFIED WITH FLETCHER A @ 1442 ON 113024 BY HENDERSON L   Potassium 4.0 3.5 - 5.1 mmol/L   Chloride 78 (L) 98 - 111 mmol/L   CO2 33 (H) 22 - 32 mmol/L   Glucose, Bld 126 (H) 70 - 99 mg/dL    Comment: Glucose reference range applies only to samples taken after fasting for at least 8 hours.   BUN 10 8 - 23 mg/dL   Creatinine, Ser 4.54 (L) 0.44 - 1.00 mg/dL   Calcium 8.3 (L) 8.9 - 10.3 mg/dL   GFR, Estimated >09 >81 mL/min    Comment: (NOTE) Calculated using the CKD-EPI Creatinine Equation (2021)    Anion gap 8 5 - 15    Comment: Performed at [facility], [address], [place], [place] [address]  Brain natriuretic peptide     Status: Abnormal   Collection Time: 07/05/23  1:49 PM  Result Value Ref Range   B Natriuretic Peptide 113.0 (H) 0.0 - 100.0 pg/mL    Comment: Performed at [facility], [address] [place], [place] [address]  Troponin I (High Sensitivity)     Status: None   Collection Time: 07/05/23  1:49 PM  Result Value Ref Range   Troponin I (High Sensitivity) 5 <18 ng/L    Comment: (NOTE) Elevated high sensitivity troponin I (hsTnI) values and significant  changes across serial measurements may suggest ACS but many other  chronic and acute conditions are known to elevate hsTnI results.  Refer to the "Links" section for chest pain algorithms and additional  guidance. Performed at [facility], [address], [place], [place] [address]   Lactic acid, plasma     Status: None   Collection Time: 07/05/23  1:49 PM  Result Value Ref Range   Lactic Acid, Venous 1.3 0.5 - 1.9 mmol/L    Comment: Performed at [facility], [address], [place], [place] [address]  Resp panel by RT-PCR (RSV, Flu A&B, Covid) Anterior Nasal Swab     Status: None   Collection Time: 07/05/23  2:36 PM   Specimen: Anterior Nasal  Swab  Result Value Ref Range   SARS Coronavirus 2 by RT PCR NEGATIVE NEGATIVE    Comment: (NOTE) SARS-CoV-2 target nucleic acids are NOT DETECTED.  The SARS-CoV-2 RNA is generally detectable in upper respiratory specimens during the acute phase of infection. The lowest concentration of SARS-CoV-2 viral copies this assay can detect is 138 copies/mL. A negative result does not preclude SARS-Cov-2 infection and should not be used as the sole basis for treatment or other patient management decisions. A negative result may occur with  improper specimen collection/handling, submission of specimen other than nasopharyngeal swab, presence of viral mutation(s) within the areas targeted by this assay, and inadequate number of viral copies(<138 copies/mL). A negative result must be combined with clinical observations, patient history, and epidemiological information. The expected result is Negative.  Fact Sheet for Patients:  BloggerCourse.com  Fact Sheet for Healthcare Providers:  SeriousBroker.it  This test is no t yet approved or cleared by the [place] FDA and  has been authorized for detection and/or diagnosis of SARS-CoV-2 by FDA under an Emergency Use Authorization (EUA). This EUA will remain  in effect (meaning this test can be used) for the duration of the COVID-19 declaration under Section 564(b)(1) of the Act, 21 U.S.C.section 360bbb-3(b)(1), unless the authorization is terminated  or revoked sooner.       Influenza A by PCR NEGATIVE NEGATIVE   Influenza B by PCR NEGATIVE NEGATIVE    Comment: (NOTE) The Xpert Xpress SARS-CoV-2/FLU/RSV plus assay is intended as an aid in the diagnosis of influenza from Nasopharyngeal swab specimens and should not be used as a sole basis for treatment. Nasal washings and aspirates are unacceptable for Xpert Xpress SARS-CoV-2/FLU/RSV testing.  Fact Sheet for  Patients: BloggerCourse.com  Fact Sheet for Healthcare Providers: SeriousBroker.it  This test is not yet approved or cleared by the [place] FDA and has been authorized for detection and/or diagnosis of SARS-CoV-2 by FDA under an Emergency Use Authorization (EUA). This EUA will remain in effect (meaning this test can be used) for the duration of the COVID-19 declaration under Section 564(b)(1) of the Act, 21 U.S.C. section 360bbb-3(b)(1), unless the authorization is terminated or revoked.     Resp Syncytial Virus by PCR NEGATIVE NEGATIVE    Comment: (NOTE) Fact Sheet for Patients: BloggerCourse.com  Fact Sheet for Healthcare Providers: SeriousBroker.it  This test is not yet approved or cleared by the [place] FDA and has been authorized for detection and/or diagnosis of SARS-CoV-2 by FDA under an Emergency Use Authorization (EUA). This EUA will remain in effect (meaning this test can be used)  for the duration of the COVID-19 declaration under Section 564(b)(1) of the Act, 21 U.S.C. section 360bbb-3(b)(1), unless the authorization is terminated or revoked.  Performed at [facility], [address] [place], [place] [address]   Lactic acid, plasma     Status: None   Collection Time: 07/05/23  3:43 PM  Result Value Ref Range   Lactic Acid, Venous 0.7 0.5 - 1.9 mmol/L    Comment: Performed at [facility], [address] [place], [place] [address]  Blood gas, arterial (at [facility])     Status: Abnormal   Collection Time: 07/05/23  3:50 PM  Result Value Ref Range   FIO2 40 %   pH, Arterial 7.3 (L) 7.35 - 7.45   pCO2 arterial 75 (HH) 32 - 48 mmHg    Comment: CRITICAL RESULT CALLED TO, READ BACK BY AND VERIFIED WITH: Lounette Sloan @ 1634 ON 113024 BY HENDERSON L    pO2, Arterial 66 (L) 83 - 108 mmHg   Bicarbonate 37.4 (H) 20.0 - 28.0 mmol/L   Acid-Base Excess 8.0 (H) 0.0 - 2.0  mmol/L   O2 Saturation 94.5 %   Patient temperature 36.7    Collection site LEFT RADIAL    Drawn by [id number]    Allens test (pass/fail) PASS PASS    Comment: Performed at [facility], [address], [place], [place] [address]  Sodium, urine, random     Status: None   Collection Time: 07/05/23  4:56 PM  Result Value Ref Range   Sodium, Ur <10 mmol/L    Comment: Performed at [facility], [address], [place], [place] [address]   CT Head Wo Contrast  Result Date: 07/05/2023 CLINICAL DATA:  Mental status change, unknown cause EXAM: CT HEAD WITHOUT CONTRAST TECHNIQUE: Contiguous axial images were obtained from the base of the skull through the vertex without intravenous contrast. RADIATION DOSE REDUCTION: This exam was performed according to the departmental dose-optimization program which includes automated exposure control, adjustment of the mA and/or kV according to patient size and/or use of iterative reconstruction technique. COMPARISON:  09/23/2017 FINDINGS: Brain: No evidence of acute infarction, hemorrhage, hydrocephalus, extra-axial collection or mass lesion/mass effect. Vascular: No hyperdense vessel or unexpected calcification. Skull: Normal. Negative for fracture or focal lesion. Sinuses/Orbits: No acute finding. Other: None. IMPRESSION: No acute intracranial findings. Electronically Signed   By: Duanne Guess D.O.   On: 07/05/2023 17:19   CT Chest W Contrast  Result Date: 07/05/2023 CLINICAL DATA:  Abnormal chest x-ray EXAM: CT CHEST WITH CONTRAST TECHNIQUE: Multidetector CT imaging of the chest was performed during intravenous contrast administration. RADIATION DOSE REDUCTION: This exam was performed according to the departmental dose-optimization program which includes automated exposure control, adjustment of the mA and/or kV according to patient size and/or use of iterative reconstruction technique. CONTRAST:  OMNIPAQUE IOHEXOL 300 MG/ML  SOLN COMPARISON:  One-view chest  x-ray 07/05/2023. Two-view chest x-ray 02/04/2023. FINDINGS: Cardiovascular: The heart size is normal. Coronary artery calcifications are present. Atherosclerotic calcifications are present the aortic arch great vessel origins. Pulmonary arteries are enlarged measuring 3.1 cm on the right and 2.5 cm on the left, similar the prior studies. Mediastinum/Nodes: Breaking scratched at paratracheal lymph nodes up to 9 mm are stable from the prior study. Subcentimeter prevascular nodes are present. No hilar mass is present. Lungs/Pleura: Centrilobular emphysematous changes are present. Small bilateral pleural effusions are present. No focal nodule or mass lesion is present. Mild dependent atelectasis is associated with the effusions. Upper Abdomen:  The visualized upper abdomen is unremarkable. Musculoskeletal: No chest wall abnormality. No acute or significant osseous findings. IMPRESSION: 1. Small bilateral pleural effusions with mild dependent atelectasis. 2. No focal nodule or mass lesion. 3. Enlarged pulmonary arteries bilaterally compatible with pulmonary arterial hypertension. 4. Coronary artery disease. 5. Aortic Atherosclerosis (ICD10-I70.0) and Emphysema (ICD10-J43.9). Electronically Signed   By: Marin Roberts M.D.   On: 07/05/2023 15:50   DG Chest Portable 1 View  Result Date: 07/05/2023 CLINICAL DATA:  hypoxia EXAM: PORTABLE CHEST 1 VIEW COMPARISON:  Chest x-ray 02/04/2023, CT chest 09/23/2017 FINDINGS: The heart and mediastinal contours are within normal limits. Question right hilar mass. Interval increase in interstitial markings. Blunting of bilateral costophrenic angles with no definite pleural effusion. No pneumothorax. No acute osseous abnormality.  Old healed right rib fractures. IMPRESSION: Question right hilar mass. Interval increase in interstitial markings. Recommend CT with intravenous contrast for further evaluation (not CTPA). Electronically Signed   By: Tish Frederickson M.D.   On:  07/05/2023 14:27    Pending Labs Unresulted Labs (From admission, onward)     Start     Ordered   07/05/23 1659  Blood gas, arterial (at [facility])  Once,   R        07/05/23 1658   07/05/23 1454  Osmolality  Once,   URGENT        07/05/23 1453   07/05/23 1453  Osmolality, urine  Once,   URGENT        07/05/23 1452            Vitals/Pain Today's Vitals   07/05/23 1430 07/05/23 1439 07/05/23 1655 07/05/23 1700  BP: (!) 136/99  (!) 159/69 (!) 142/95  Pulse: 79  95 94  Resp: 16  17 18   Temp:  (!) 96 F (35.6 C) (!) 96.8 F (36 C)   TempSrc:  Rectal Rectal   SpO2: 93%  95% 96%  Weight:      Height:      PainSc:        Isolation Precautions No active isolations  Medications Medications  iohexol (OMNIPAQUE) 300 MG/ML solution 75 mL (100 mLs Intravenous Contrast Given 07/05/23 1521)    Mobility walks with device     Focused Assessments Pulmonary Assessment Handoff:  Lung sounds:   O2 Device: Nasal Cannula O2 Flow Rate (L/min): 4 L/min    R Recommendations: See Admitting Provider Note  Report given to:   Additional Notes: pt's initial oxygen saturation 60% in triage. Per family care worker pt is suppose to wear 2L of oxygen at night and pt refuses "we had the doctor change it to PRN b/c state dinged [place] b/c we did not force her to wear the oxygen." Pt oxygen on 4L of oxygen 92-95% on [place].

## 2023-07-05 NOTE — ED Provider Notes (Signed)
[place] EMERGENCY DEPARTMENT AT [facility] Provider Note   CSN: [id number] Arrival date & time: 07/05/23  1328     History  Chief Complaint  Patient presents with   Altered Mental Status    Nancy Erickson is a 67 y.o. female with past medical hx including PE on Xarelto, hypertension, cardiomyopathy, pulmonary hypertension, and paranoid schizophrenia along with psychogenic polydipsia, former smoker,  presenting with shortness of breath and altered mental status. She lives at [facility] and caregiver at bedside states she is supposed to wear oxygen 3L prn during the day and continuously at night but it is always difficult as she is noncompliant with her oxygen.  He states since yesterday she has had increased confusion, stating she has been talking nonsensical and this am was found sitting on the floor in her room which is very unlike her. She has been coughing,  no documented fevers or known infection/exposures.  This morning she looked "blue" so she was transported here.   After labs obtained,  caregiver confirms that pt continues to have problems with excessive water intake.  He states at one point her primary MD had written orders to restrict her intake and the only way to comply was to turn the water source off to the sink in her bathroom.  However, state inspection would not allow this to remain off.     The history is provided by the patient.       Home Medications Prior to Admission medications   Medication Sig Start Date End Date Taking? Authorizing Provider  albuterol (PROVENTIL HFA;VENTOLIN HFA) 108 (90 Base) MCG/ACT inhaler Inhale 2 puffs into the lungs every 6 (six) hours as needed for wheezing or shortness of breath. 09/30/17  Yes Vassie Loll, MD  amLODipine (NORVASC) 10 MG tablet Take 10 mg by mouth daily.   Yes [provider]  ATROVENT HFA 17 MCG/ACT inhaler Inhale 1 puff into the lungs every 4 (four) hours as needed for wheezing.  06/30/23  Yes [provider]  benztropine (COGENTIN) 0.5 MG tablet Take 0.5 mg by mouth daily. 06/27/23  Yes [provider]  INVEGA TRINZA 410 MG/1. injection Inject 410 mg into the muscle every 3 (three) months. 04/17/23  Yes [provider]  loratadine (CLARITIN) 10 MG tablet Take 10 mg by mouth daily.   Yes [provider]  metoprolol tartrate (LOPRESSOR) 25 MG tablet Take 25 mg by mouth 2 (two) times daily.   Yes [provider]  OXYGEN Inhale 3 L/min into the lungs at bedtime.   Yes [provider]  pantoprazole (PROTONIX) 40 MG tablet Take 1 tablet (40 mg total) by mouth daily. 10/01/17  Yes Vassie Loll, MD  risperiDONE (RISPERDAL) 3 MG tablet Take 3 mg by mouth 2 (two) times daily.   Yes [provider]  rivaroxaban (XARELTO) 20 MG TABS tablet Take 1 tablet (20 mg total) by mouth daily with supper. 11/08/14  Yes Calvert Cantor, MD  Skin Protectants, Misc. (EUCERIN) cream Apply topically as needed for wound care. Tac/euc 0.1% 1:4 Apply sparingly to rash on lower legs   Yes [provider]  sodium chloride 1 g tablet Take 1 g by mouth 2 (two) times daily. 06/27/23  Yes [provider]  triamcinolone cream (KENALOG) 0.1 % Apply 1 application topically 2 (two) times daily.  09/28/14  Yes [provider]      Allergies    Phosphate, Chocolate, Artane [trihexyphenidyl], and Codeine  Review of Systems   Review of Systems  Unable to perform ROS: Mental status change  Psychiatric/Behavioral:  Positive for confusion.     Physical Exam Updated Vital Signs BP (!) 136/99   Pulse 79   Temp (!) 96 F (35.6 C) (Rectal)   Resp 16   Ht 5\' 4"  (1.626 m)   Wt 69.9 kg   LMP 12/29/2013   SpO2 93%   BMI 26.45 kg/m  Physical Exam Vitals and nursing note reviewed.  Constitutional:      Appearance: She is well-developed.     Comments: Answers yes and no questions.  HENT:     Head: Normocephalic and  atraumatic.  Eyes:     Conjunctiva/sclera: Conjunctivae normal.  Cardiovascular:     Rate and Rhythm: Normal rate and regular rhythm.     Heart sounds: Normal heart sounds.  Pulmonary:     Effort: Respiratory distress present.     Breath sounds: Normal breath sounds. No wheezing.     Comments: Initial o2 sat 59%,  improved to 97% on 4L [place].  Abdominal:     General: Bowel sounds are normal. There is distension.     Palpations: Abdomen is soft.     Tenderness: There is no abdominal tenderness. There is no guarding.  Musculoskeletal:        General: Normal range of motion.     Cervical back: Normal range of motion.  Skin:    General: Skin is dry.     Coloration: Skin is pale.     Comments: Cool skin  Neurological:     Mental Status: She is alert. She is confused.     Comments: Awake but confused,  no slurred speech but slow responses, mostly yes/no answers, unable to elaborate.  Moves all 4 extremities without deficit.  No facial droop.      ED Results / Procedures / Treatments   Labs (all labs ordered are listed, but only abnormal results are displayed) Labs Reviewed  BASIC METABOLIC PANEL - Abnormal; Notable for the following components:      Result Value   Sodium 119 (*)    Chloride 78 (*)    CO2 33 (*)    Glucose, Bld 126 (*)    Creatinine, Ser 0.43 (*)    Calcium 8.3 (*)    All other components within normal limits  BRAIN NATRIURETIC PEPTIDE - Abnormal; Notable for the following components:   B Natriuretic Peptide 113.0 (*)    All other components within normal limits  RESP PANEL BY RT-PCR (RSV, FLU A&B, COVID)  [id number]  LACTIC ACID, PLASMA  BLOOD GAS, ARTERIAL  LACTIC ACID, PLASMA  OSMOLALITY, URINE  SODIUM, URINE, RANDOM  OSMOLALITY  TROPONIN I (HIGH SENSITIVITY)    EKG EKG Interpretation Date/Time:  Saturday July 05 2023 13:42:23 EST Ventricular Rate:  82 PR Interval:  196 QRS Duration:  99 QT Interval:  356 QTC Calculation: 416 R Axis:   85  Text  Interpretation: Sinus rhythm Left atrial enlargement Borderline right axis deviation Interpretation limited secondary to artifact Confirmed by Pricilla Loveless [phone]) on 07/05/2023 2:50:03 PM  Radiology CT Chest W Contrast  Result Date: 07/05/2023 CLINICAL DATA:  Abnormal chest x-ray EXAM: CT CHEST WITH CONTRAST TECHNIQUE: Multidetector CT imaging of the chest was performed during intravenous contrast administration. RADIATION DOSE REDUCTION: This exam was performed according to the departmental dose-optimization program which includes automated exposure control, adjustment of the mA and/or kV according to patient size and/or use of  iterative reconstruction technique. CONTRAST:  OMNIPAQUE IOHEXOL 300 MG/ML  SOLN COMPARISON:  One-view chest x-ray 07/05/2023. Two-view chest x-ray 02/04/2023. FINDINGS: Cardiovascular: The heart size is normal. Coronary artery calcifications are present. Atherosclerotic calcifications are present the aortic arch great vessel origins. Pulmonary arteries are enlarged measuring 3.1 cm on the right and 2.5 cm on the left, similar the prior studies. Mediastinum/Nodes: Breaking scratched at paratracheal lymph nodes up to 9 mm are stable from the prior study. Subcentimeter prevascular nodes are present. No hilar mass is present. Lungs/Pleura: Centrilobular emphysematous changes are present. Small bilateral pleural effusions are present. No focal nodule or mass lesion is present. Mild dependent atelectasis is associated with the effusions. Upper Abdomen: The visualized upper abdomen is unremarkable. Musculoskeletal: No chest wall abnormality. No acute or significant osseous findings. IMPRESSION: 1. Small bilateral pleural effusions with mild dependent atelectasis. 2. No focal nodule or mass lesion. 3. Enlarged pulmonary arteries bilaterally compatible with pulmonary arterial hypertension. 4. Coronary artery disease. 5. Aortic Atherosclerosis (ICD10-I70.0) and Emphysema  (ICD10-J43.9). Electronically Signed   By: Marin Roberts M.D.   On: 07/05/2023 15:50   DG Chest Portable 1 View  Result Date: 07/05/2023 CLINICAL DATA:  hypoxia EXAM: PORTABLE CHEST 1 VIEW COMPARISON:  Chest x-ray 02/04/2023, CT chest 09/23/2017 FINDINGS: The heart and mediastinal contours are within normal limits. Question right hilar mass. Interval increase in interstitial markings. Blunting of bilateral costophrenic angles with no definite pleural effusion. No pneumothorax. No acute osseous abnormality.  Old healed right rib fractures. IMPRESSION: Question right hilar mass. Interval increase in interstitial markings. Recommend CT with intravenous contrast for further evaluation (not CTPA). Electronically Signed   By: Tish Frederickson M.D.   On: 07/05/2023 14:27    Procedures Procedures    Medications Ordered in ED Medications  0.9 %  sodium chloride infusion (has no administration in time range)  iohexol (OMNIPAQUE) 300 MG/ML solution 75 mL (100 mLs Intravenous Contrast Given 07/05/23 1521)    ED Course/ Medical Decision Making/ A&P                                 Medical Decision Making Pt presenting with AMS and hypoxia with history of pulmonary HTN, PE (complaint with Lesia Hausen), who is supposed to be on home oxygen 3L but frequently refused to wear it.  DDX including hypercarbia,  metabolic derangement,  pneumonia, CAD, returned PE although no tachycardia or sig work of breathing, normal resp rate.  Sx prob secondary to hypoxia/hypercarbia given initial sx on arrival.      Labs obtained also confirming significant hyponatremia,  another source for AMS.  Additional labs ordered, osmolality, etc,  NS 75 cc/hr started,  will obtained head CT to confirm no intracranial findings for AMS.    Abg drawn at 15:50 - PCO2 75.  Will repeat now to determine +/- bipap.    Amount and/or Complexity of Data Reviewed Labs: ordered.    Details: Initial troponin is negative, lactic acid is also  normal at 1.3.  She has a significant hyponatremia with a sodium of 119, her chloride is 78 her CO2 is elevated at 33, glucose 126, she has a low creatinine at 0.43, BN P is 113 Radiology: ordered.    Details: Initially a portable chest was obtained questionable right hilar mass with recommendation for CT chest.  This was also completed, she does not have a perihilar mass does have small bilateral pleural effusions  along with enlarged pulmonary arteries consistent with pulmonary hypertension ECG/medicine tests: ordered and independent interpretation performed.    Details: Rate 82, LAD, stable Discussion of management or test interpretation with external provider(s): Pt discussed with Dr. Adrian Blackwater who agrees with admission.  Risk Decision regarding hospitalization.           Final Clinical Impression(s) / ED Diagnoses Final diagnoses:  Hypoxia  Hyponatremia    Rx / DC Orders ED Discharge Orders     None         Victoriano Lain 07/05/23 1751    Pricilla Loveless, MD 07/06/23 [phone]

## 2023-07-06 ENCOUNTER — Inpatient Hospital Stay (HOSPITAL_COMMUNITY): Payer: Medicare Other

## 2023-07-06 DIAGNOSIS — J9621 Acute and chronic respiratory failure with hypoxia: Secondary | ICD-10-CM | POA: Diagnosis not present

## 2023-07-06 DIAGNOSIS — G9341 Metabolic encephalopathy: Secondary | ICD-10-CM | POA: Diagnosis not present

## 2023-07-06 DIAGNOSIS — J9622 Acute and chronic respiratory failure with hypercapnia: Secondary | ICD-10-CM | POA: Diagnosis not present

## 2023-07-06 DIAGNOSIS — F200 Paranoid schizophrenia: Secondary | ICD-10-CM

## 2023-07-06 LAB — BLOOD GAS, ARTERIAL
Acid-Base Excess: 11.5 mmol/L — ABNORMAL HIGH (ref 0.0–2.0)
Acid-Base Excess: 13.1 mmol/L — ABNORMAL HIGH (ref 0.0–2.0)
Bicarbonate: 43.3 mmol/L — ABNORMAL HIGH (ref 20.0–28.0)
Bicarbonate: 44.2 mmol/L — ABNORMAL HIGH (ref 20.0–28.0)
Drawn by: 27016
Drawn by: 27016
O2 Saturation: 94.9 %
O2 Saturation: 95.1 %
Patient temperature: 36.6
Patient temperature: 36.6
pCO2 arterial: 99 mm[Hg] (ref 32–48)
pCO2 arterial: 92 mm[Hg] (ref 32–48)
pH, Arterial: 7.25 — ABNORMAL LOW (ref 7.35–7.45)
pH, Arterial: 7.29 — ABNORMAL LOW (ref 7.35–7.45)
pO2, Arterial: 68 mm[Hg] — ABNORMAL LOW (ref 83–108)
pO2, Arterial: 69 mm[Hg] — ABNORMAL LOW (ref 83–108)

## 2023-07-06 LAB — CBC
HCT: 40.1 % (ref 36.0–46.0)
Hemoglobin: 12.9 g/dL (ref 12.0–15.0)
MCH: 29.7 pg (ref 26.0–34.0)
MCHC: 32.2 g/dL (ref 30.0–36.0)
MCV: 92.2 fL (ref 80.0–100.0)
Platelets: 282 10*3/uL (ref 150–400)
RBC: 4.35 MIL/uL (ref 3.87–5.11)
RDW: 13.8 % (ref 11.5–15.5)
WBC: 6.6 10*3/uL (ref 4.0–10.5)
nRBC: 0 % (ref 0.0–0.2)

## 2023-07-06 LAB — URINALYSIS, W/ REFLEX TO CULTURE (INFECTION SUSPECTED)
Bilirubin Urine: NEGATIVE
Glucose, UA: NEGATIVE mg/dL
Hgb urine dipstick: NEGATIVE
Ketones, ur: 5 mg/dL — AB
Leukocytes,Ua: NEGATIVE
Nitrite: NEGATIVE
Protein, ur: NEGATIVE mg/dL
Specific Gravity, Urine: 1.008 (ref 1.005–1.030)
pH: 6 (ref 5.0–8.0)

## 2023-07-06 LAB — APTT: aPTT: 29 s (ref 24–36)

## 2023-07-06 LAB — BASIC METABOLIC PANEL
Anion gap: 8 (ref 5–15)
BUN: 8 mg/dL (ref 8–23)
CO2: 36 mmol/L — ABNORMAL HIGH (ref 22–32)
Calcium: 8.5 mg/dL — ABNORMAL LOW (ref 8.9–10.3)
Chloride: 85 mmol/L — ABNORMAL LOW (ref 98–111)
Creatinine, Ser: 0.46 mg/dL (ref 0.44–1.00)
GFR, Estimated: >60 mL/min (ref >60)
Glucose, Bld: 134 mg/dL — ABNORMAL HIGH (ref 70–99)
Potassium: 4.1 mmol/L (ref 3.5–5.1)
Sodium: 129 mmol/L — ABNORMAL LOW (ref 135–145)

## 2023-07-06 LAB — RAPID URINE DRUG SCREEN, HOSP PERFORMED
Amphetamines: NOT DETECTED
Barbiturates: NOT DETECTED
Benzodiazepines: NOT DETECTED
Cocaine: NOT DETECTED
Opiates: NOT DETECTED
Tetrahydrocannabinol: NOT DETECTED

## 2023-07-06 LAB — PROTIME-INR
INR: 1.1 (ref 0.8–1.2)
Prothrombin Time: 14 s (ref 11.4–15.2)

## 2023-07-06 LAB — BRAIN NATRIURETIC PEPTIDE: B Natriuretic Peptide: 93 pg/mL (ref 0.0–100.0)

## 2023-07-06 LAB — SODIUM
Sodium: 126 mmol/L — ABNORMAL LOW (ref 135–145)
Sodium: 128 mmol/L — ABNORMAL LOW (ref 135–145)
Sodium: 128 mmol/L — ABNORMAL LOW (ref 135–145)
Sodium: 128 mmol/L — ABNORMAL LOW (ref 135–145)
Sodium: 129 mmol/L — ABNORMAL LOW (ref 135–145)

## 2023-07-06 LAB — HEPARIN LEVEL (UNFRACTIONATED): Heparin Unfractionated: <0.1 [IU]/mL — ABNORMAL LOW (ref 0.30–0.70)

## 2023-07-06 LAB — CREATININE, URINE, RANDOM: Creatinine, Urine: 21 mg/dL

## 2023-07-06 LAB — CK: Total CK: 37 U/L — ABNORMAL LOW (ref 38–234)

## 2023-07-06 LAB — FOLATE: Folate: 26.9 ng/mL (ref >5.9)

## 2023-07-06 LAB — VITAMIN B12: Vitamin B-12: 340 pg/mL (ref 180–914)

## 2023-07-06 LAB — SODIUM, URINE, RANDOM: Sodium, Ur: <10 mmol/L

## 2023-07-06 LAB — CORTISOL-AM, BLOOD: Cortisol - AM: 4.5 ug/dL — ABNORMAL LOW (ref 6.7–22.6)

## 2023-07-06 LAB — PROCALCITONIN: Procalcitonin: <0.1 ng/mL

## 2023-07-06 LAB — TSH: TSH: 0.947 u[IU]/mL (ref 0.350–4.500)

## 2023-07-06 LAB — T4, FREE: Free T4: 1.18 ng/dL — ABNORMAL HIGH (ref 0.61–1.12)

## 2023-07-06 LAB — AMMONIA: Ammonia: 51 umol/L — ABNORMAL HIGH (ref 9–35)

## 2023-07-06 MED ORDER — HEPARIN BOLUS VIA INFUSION
4400.0000 [IU] | Freq: Once | INTRAVENOUS | Status: AC
Start: 2023-07-06 — End: 2023-07-06
  Administered 2023-07-06: 4400 [IU] via INTRAVENOUS
  Filled 2023-07-06: qty 4400

## 2023-07-06 MED ORDER — CHLORHEXIDINE GLUCONATE CLOTH 2 % EX PADS
6.0000 | MEDICATED_PAD | Freq: Every day | CUTANEOUS | Status: DC
Start: 2023-07-06 — End: 2023-07-09
  Administered 2023-07-06 – 2023-07-08 (×3): 6 via TOPICAL

## 2023-07-06 MED ORDER — HEPARIN (PORCINE) 25000 UT/250ML-% IV SOLN
1150.0000 [IU]/h | INTRAVENOUS | Status: DC
  Administered 2023-07-06 – 2023-07-07 (×2): 1150 [IU]/h via INTRAVENOUS
  Filled 2023-07-06 (×2): qty 250

## 2023-07-06 MED ORDER — PANTOPRAZOLE SODIUM 40 MG IV SOLR
40.0000 mg | INTRAVENOUS | Status: DC
  Administered 2023-07-06 – 2023-07-09 (×4): 40 mg via INTRAVENOUS
  Filled 2023-07-06 (×4): qty 10

## 2023-07-06 NOTE — Hospital Course (Signed)
67 year old female with a history of pulmonary embolus on the rivaroxaban, hypertension, pulmonary hypertension, paranoid schizophrenia, primary polydipsia, chronic respiratory failure on 3 L, tobacco abuse in remission presenting from [facility] secondary to altered mental status and shortness of breath.  The patient was noted to have increasing confusion and nonsensical speech on 07/04/2023.  In the morning of 07/05/2023, the patient was found sitting on the floor of her room confused.  Apparently the patient "looked blue".  The patient has had noncompliance with her oxygen.  She frequently takes it off.  Patient continues to have problems with polydipsia.  caregiver confirms that pt continues to have problems with excessive water intake. He states at one point her primary MD had written orders to restrict her intake and the only way to comply was to turn the water source off to the sink in her bathroom. However, state inspection would not allow this to remain off.   In the ED, the patient was afebrile and hemodynamically stable.  Oxygen saturation was 59% on room air.  She was placed on nonrebreather.  Repeat ABG on 07/05/2023 1935 hrs. showed 7.29/85/75/40.  Because of increasing pCO2 and some somnolence, the patient was placed on BiPAP.  Apparently, the patient had periods of agitation overnight 11/30-12/1/24.  The patient was given Haldol 5 mg IV at 2028 and 0327.  She was given IV ativan 1 mg at 1908, 0005, 0405.  At the time of my evaluation on 07/06/2023, the patient remains on BiPAP.  She is somnolent.  She is arousable to protopathic stimuli.

## 2023-07-06 NOTE — Progress Notes (Signed)
PHARMACY - ANTICOAGULATION CONSULT NOTE  Pharmacy Consult for Heparin Infusion Indication: DVT  Allergies  Allergen Reactions   Phosphate Nausea And Vomiting   Chocolate Swelling    Facial swelling and bumps   Artane [Trihexyphenidyl] Other (See Comments)    unknown   Codeine Other (See Comments)    unknown    Patient Measurements: Height: 5\' 4"  (162.6 cm) Weight: 69.9 kg (154 lb 1.6 oz) IBW/kg (Calculated) : 54.7 Heparin Dosing Weight: 68.8 kg  Vital Signs: Temp: 97.8 F (36.6 C) (12/01 1507) Temp Source: Axillary (12/01 1507) BP: 141/109 (12/01 1200) Pulse Rate: 121 (12/01 1300)  Labs: Recent Labs    07/05/23 1349 07/06/23 0514 07/06/23 0821  HGB  --   --  12.9  HCT  --   --  40.1  PLT  --   --  282  CREATININE 0.43* 0.46  --   CKTOTAL  --   --  37*  TROPONINIHS 5  --   --     Estimated Creatinine Clearance: 67.3 mL/min (by C-G formula based on SCr of 0.46 mg/dL).   Medical History: Past Medical History:  Diagnosis Date   Hypertension    Hyponatremia    Paranoid schizophrenia (HCC)    Pulmonary embolus (HCC)    Venous insufficiency of right lower extremity     Assessment: Patient is a 67 year old female with a past medical history of pulmonary embolus on the rivaroxaban, hypertension, pulmonary hypertension, paranoid schizophrenia, primary polydipsia, chronic respiratory failure on 3 L, tobacco abuse in remission presenting from [facility] secondary to altered mental status and shortness of breath. Patient's last dose of rivaroxaban was 11/29 @ 1700. Pharmacy was consulted to initiate patient on a heparin infusion.  Baseline INR 1.1, aPTT 29, and heparin level <0.1. Hgb 12.9. PLT 282.  No signs/symptoms of bleeding noted in chart.  Goal of Therapy:  Heparin level 0.3-0.7 units/ml Heparin level 66-102 units/ml Monitor platelets by anticoagulation protocol: Yes   Plan:  Given heparin level is <0.1, will give 4400 unit bolus x 1 Start heparin  infusion at a rate of 1150 units/hr Check HL in 6 hours Monitor CBC daily  Merryl Hacker, PharmD Clinical Pharmacist 07/06/2023,5:01 PM

## 2023-07-06 NOTE — Progress Notes (Signed)
Date and time results received: 07/06/23 1028 (use smartphrase ".now" to insert current time)  Test: PaCO2 Critical Value: 37  Name of Provider Notified: Dr. Arbutus Leas  Orders Received? Or Actions Taken?: MD and RT aware of situation. Bipap setting adjusted to allow for more CO2 blow off. Level is down from an hour ago when it was 99. Pt LOC improving. At bedside with RT.

## 2023-07-06 NOTE — Progress Notes (Signed)
Patient removed from Bipap by RN. Patient remains pleasantly confused and fidgety. She is very hard to understand when speaking. RT will continue to monitor for need of Bipap.

## 2023-07-06 NOTE — Progress Notes (Signed)
Afternoon rounds -decision was made to defer intubation despite the 0845 ABG as the patient's BiPAP settings were not changed/adjusted since ABG 1935 on 07/05/23 and pt received multiple doses of ativan and haldol as discussed in my previous note  Repeat ABG showed slight improvement and pt was waking up Saw pt multiple times during day on 12/1 and her mentation gradually improved and FiO2 was decreased.  At 1740, pt was seen off BiPAP--following commands and answering questions somewhat appropriately--still pleasantly confused.  Unclear baseline mentation.  Now stable on 4L without increase WOB with saturation 94-95%   Subjective: Denies cp, sob, abd pain, vomiting  Vitals:   07/06/23 1200 07/06/23 1203 07/06/23 1300 07/06/23 1507  BP: (!) 141/109     Pulse: (!) 112  (!) 121   Resp: 18  18   Temp:  98 F (36.7 C)  97.8 F (36.6 C)  TempSrc:  Axillary  Axillary  SpO2: 93%  99%   Weight:      Height:       CV--RRR Lung--bilateral rales Abd--soft+BS/NT   Assessment/Plan: Acute metabolic enceph -multifactorial -d/c ativan -d/c risperdal for now -haldol for extreme agitation -continue 1:1 sitter -am ABG if goes back on BiPAP -am CXR -labs from today reviewed     Catarina Hartshorn, DO [facility]

## 2023-07-06 NOTE — Progress Notes (Addendum)
PROGRESS NOTE  Nancy Erickson AOZ:[id number] DOB: Jan 29, 1956 DOA: 07/05/2023 PCP: Henrine Screws, MD  Brief History:  67 year old female with a history of pulmonary embolus on the rivaroxaban, hypertension, pulmonary hypertension, paranoid schizophrenia, primary polydipsia, chronic respiratory failure on 3 L, tobacco abuse in remission presenting from [facility] secondary to altered mental status and shortness of breath.  The patient was noted to have increasing confusion and nonsensical speech on 07/04/2023.  In the morning of 07/05/2023, the patient was found sitting on the floor of her room confused.  Apparently the patient "looked blue".  The patient has had noncompliance with her oxygen.  She frequently takes it off.  Patient continues to have problems with polydipsia.  caregiver confirms that pt continues to have problems with excessive water intake. He states at one point her primary MD had written orders to restrict her intake and the only way to comply was to turn the water source off to the sink in her bathroom. However, state inspection would not allow this to remain off.   In the ED, the patient was afebrile and hemodynamically stable.  Oxygen saturation was 59% on room air.  She was placed on nonrebreather.  Repeat ABG on 07/05/2023 1935 hrs. showed 7.29/85/75/40.  Because of increasing pCO2 and some somnolence, the patient was placed on BiPAP.  Apparently, the patient had periods of agitation overnight 11/30-12/1/24.  The patient was given Haldol 5 mg IV at 2028 and 0327.  She was given IV ativan 1 mg at 1908, 0005, 0405.  At the time of my evaluation on 07/06/2023, the patient remains on BiPAP.  She is somnolent.  She is arousable to protopathic stimuli.   Assessment/Plan: Acute on chronic respiratory failure with hypoxia -Etiology unclear presently -Workup in progress -Check CBC -Check PCT -BNP -Repeat chest x-ray -Repeat ABG -Personally reviewed chest x-ray  07/05/2023--increased interstitial markings -07/05/2023 CT chest--small bilateral pleural effusions, emphysema, no frank consolidations, no mass -Currently on BiPAP -Continue DuoNebs -COVID/RSV/flu--neg  Acute metabolic encephalopathy -Likely multifactorial including hyponatremia, hypercarbia -Check ammonia -B12 -TSH -Repeat ABG -07/05/2023 CT brain negative for acute findings -Obtain UA -UDS -Discontinue hypnotic meds for now  Hyponatremia -Presented with sodium 119 -Patient has chronic hyponatremia with baseline 125-130 -Discontinue sodium chloride tablets for now -Urine osmolarity 356 -Serum osmolarity 257 -Urine sodium<10  History of pulmonary embolus -Continue rivaroxaban -Plan to switch to parenteral anticoagulation if not able to take po  Essential hypertension -Holding amlodipine temporarily -Holding metoprolol temporarily  Paranoid schizophrenia -Hold Risperdal temporarily as the patient is somnolent     Family Communication:  no  Family at bedside  Consultants:  none  Code Status:  FULL   DVT Prophylaxis:  Xarelto   Procedures: As Listed in Progress Note Above  Antibiotics: None    The patient is critically ill with multiple organ systems failure and requires high complexity decision making for assessment and support, frequent evaluation and titration of therapies, application of advanced monitoring technologies and extensive interpretation of multiple databases.  Critical care time - 40 mins.     Subjective: Unobtainable secondary to patient's somnolence  Objective: Vitals:   07/06/23 0500 07/06/23 0600 07/06/23 0700 07/06/23 0815  BP: (!) 118/52 (!) 121/55 (!) 142/57   Pulse: (!) 109 (!) 104 (!) 101   Resp: 19  (!) 21   Temp:    97.8 F (36.6 C)  TempSrc:    Axillary  SpO2: 92% 93% (!) 89%  Weight:      Height:        Intake/Output Summary (Last 24 hours) at 07/06/2023 0818 Last data filed at 07/06/2023 1610 Gross per 24 hour   Intake --  Output 2000 ml  Net -2000 ml   Weight change:  Exam:  General:  Pt is somnolent, does not follow commands appropriately, not in acute distress HEENT: No icterus, No thrush, No neck mass, [place]/AT Cardiovascular: RRR, S1/S2, no rubs, no gallops Respiratory: Bilateral scattered rhonchi Abdomen: Soft/+BS, non tender, non distended, no guarding Extremities: Trace LE edema, No lymphangitis, No petechiae, No rashes, no synovitis   Data Reviewed: I have personally reviewed following labs and imaging studies Basic Metabolic Panel: Recent Labs  Lab 07/05/23 1349 07/05/23 2020 07/06/23 0025 07/06/23 0514  NA 119* 123* 126* 129*  K 4.0  --   --  4.1  CL 78*  --   --  85*  CO2 33*  --   --  36*  GLUCOSE 126*  --   --  134*  BUN 10  --   --  8  CREATININE 0.43*  --   --  0.46  CALCIUM 8.3*  --   --  8.5*   Liver Function Tests: No results for input(s): "AST", "ALT", "ALKPHOS", "BILITOT", "PROT", "ALBUMIN" in the last 168 hours. No results for input(s): "LIPASE", "AMYLASE" in the last 168 hours. No results for input(s): "AMMONIA" in the last 168 hours. Coagulation Profile: No results for input(s): "INR", "PROTIME" in the last 168 hours. CBC: No results for input(s): "WBC", "NEUTROABS", "HGB", "HCT", "MCV", "PLT" in the last 168 hours. Cardiac Enzymes: No results for input(s): "CKTOTAL", "CKMB", "CKMBINDEX", "TROPONINI" in the last 168 hours. BNP: Invalid input(s): "POCBNP" CBG: No results for input(s): "GLUCAP" in the last 168 hours. HbA1C: No results for input(s): "HGBA1C" in the last 72 hours. Urine analysis:    Component Value Date/Time   COLORURINE YELLOW 09/23/2017 2220   APPEARANCEUR CLEAR 09/23/2017 2220   LABSPEC 1.025 09/23/2017 2220   PHURINE 6.0 09/23/2017 2220   GLUCOSEU NEGATIVE 09/23/2017 2220   HGBUR NEGATIVE 09/23/2017 2220   BILIRUBINUR NEGATIVE 09/23/2017 2220   KETONESUR NEGATIVE 09/23/2017 2220   PROTEINUR 100 (A) 09/23/2017 2220    UROBILINOGEN 0.2 11/07/2013 2103   NITRITE NEGATIVE 09/23/2017 2220   LEUKOCYTESUR NEGATIVE 09/23/2017 2220   Sepsis Labs: @LABRCNTIP (procalcitonin:4,lacticidven:4) ) Recent Results (from the past 240 hour(s))  Resp panel by RT-PCR (RSV, Flu A&B, Covid) Anterior Nasal Swab     Status: None   Collection Time: 07/05/23  2:36 PM   Specimen: Anterior Nasal Swab  Result Value Ref Range Status   SARS Coronavirus 2 by RT PCR NEGATIVE NEGATIVE Final    Comment: (NOTE) SARS-CoV-2 target nucleic acids are NOT DETECTED.  The SARS-CoV-2 RNA is generally detectable in upper respiratory specimens during the acute phase of infection. The lowest concentration of SARS-CoV-2 viral copies this assay can detect is 138 copies/mL. A negative result does not preclude SARS-Cov-2 infection and should not be used as the sole basis for treatment or other patient management decisions. A negative result may occur with  improper specimen collection/handling, submission of specimen other than nasopharyngeal swab, presence of viral mutation(s) within the areas targeted by this assay, and inadequate number of viral copies(<138 copies/mL). A negative result must be combined with clinical observations, patient history, and epidemiological information. The expected result is Negative.  Fact Sheet for Patients:  BloggerCourse.com  Fact Sheet for Healthcare Providers:  SeriousBroker.it  This test is no t yet approved or cleared by the [place] and  has been authorized for detection and/or diagnosis of SARS-CoV-2 by FDA under an Emergency Use Authorization (EUA). This EUA will remain  in effect (meaning this test can be used) for the duration of the COVID-19 declaration under Section 564(b)(1) of the Act, 21 U.S.C.section 360bbb-3(b)(1), unless the authorization is terminated  or revoked sooner.       Influenza A by PCR NEGATIVE NEGATIVE Final    Influenza B by PCR NEGATIVE NEGATIVE Final    Comment: (NOTE) The Xpert Xpress SARS-CoV-2/FLU/RSV plus assay is intended as an aid in the diagnosis of influenza from Nasopharyngeal swab specimens and should not be used as a sole basis for treatment. Nasal washings and aspirates are unacceptable for Xpert Xpress SARS-CoV-2/FLU/RSV testing.  Fact Sheet for Patients: BloggerCourse.com  Fact Sheet for Healthcare Providers: SeriousBroker.it  This test is not yet approved or cleared by the [place] FDA and has been authorized for detection and/or diagnosis of SARS-CoV-2 by FDA under an Emergency Use Authorization (EUA). This EUA will remain in effect (meaning this test can be used) for the duration of the COVID-19 declaration under Section 564(b)(1) of the Act, 21 U.S.C. section 360bbb-3(b)(1), unless the authorization is terminated or revoked.     Resp Syncytial Virus by PCR NEGATIVE NEGATIVE Final    Comment: (NOTE) Fact Sheet for Patients: BloggerCourse.com  Fact Sheet for Healthcare Providers: SeriousBroker.it  This test is not yet approved or cleared by the [place] FDA and has been authorized for detection and/or diagnosis of SARS-CoV-2 by FDA under an Emergency Use Authorization (EUA). This EUA will remain in effect (meaning this test can be used) for the duration of the COVID-19 declaration under Section 564(b)(1) of the Act, 21 U.S.C. section 360bbb-3(b)(1), unless the authorization is terminated or revoked.  Performed at [facility], [address], [place], [place] [address]   MRSA Next Gen by PCR, Nasal     Status: None   Collection Time: 07/05/23  7:00 PM   Specimen: Nasal Mucosa; Nasal Swab  Result Value Ref Range Status   MRSA by PCR Next Gen NOT DETECTED NOT DETECTED Final    Comment: (NOTE) The GeneXpert MRSA Assay (FDA approved for NASAL specimens  only), is one component of a comprehensive MRSA colonization surveillance program. It is not intended to diagnose MRSA infection nor to guide or monitor treatment for MRSA infections. Test performance is not FDA approved in patients less than 21 years old. Performed at [facility], [address] [place], [place] [address]      Scheduled Meds:  benztropine  0.5 mg Oral Daily   Chlorhexidine Gluconate Cloth  6 each Topical Daily   methylPREDNISolone (SOLU-MEDROL) injection  60 mg Intravenous Q12H   metoprolol tartrate  25 mg Oral BID   pantoprazole (PROTONIX) IV  40 mg Intravenous Q24H   risperiDONE  3 mg Oral BID   rivaroxaban  20 mg Oral Q supper   Continuous Infusions:  Procedures/Studies: CT Head Wo Contrast  Result Date: 07/05/2023 CLINICAL DATA:  Mental status change, unknown cause EXAM: CT HEAD WITHOUT CONTRAST TECHNIQUE: Contiguous axial images were obtained from the base of the skull through the vertex without intravenous contrast. RADIATION DOSE REDUCTION: This exam was performed according to the departmental dose-optimization program which includes automated exposure control, adjustment of the mA and/or kV according to patient size and/or use of iterative reconstruction technique. COMPARISON:  09/23/2017 FINDINGS: Brain: No  evidence of acute infarction, hemorrhage, hydrocephalus, extra-axial collection or mass lesion/mass effect. Vascular: No hyperdense vessel or unexpected calcification. Skull: Normal. Negative for fracture or focal lesion. Sinuses/Orbits: No acute finding. Other: None. IMPRESSION: No acute intracranial findings. Electronically Signed   By: Duanne Guess D.O.   On: 07/05/2023 17:19   CT Chest W Contrast  Result Date: 07/05/2023 CLINICAL DATA:  Abnormal chest x-ray EXAM: CT CHEST WITH CONTRAST TECHNIQUE: Multidetector CT imaging of the chest was performed during intravenous contrast administration. RADIATION DOSE REDUCTION: This exam was performed according  to the departmental dose-optimization program which includes automated exposure control, adjustment of the mA and/or kV according to patient size and/or use of iterative reconstruction technique. CONTRAST:  OMNIPAQUE IOHEXOL 300 MG/ML  SOLN COMPARISON:  One-view chest x-ray 07/05/2023. Two-view chest x-ray 02/04/2023. FINDINGS: Cardiovascular: The heart size is normal. Coronary artery calcifications are present. Atherosclerotic calcifications are present the aortic arch great vessel origins. Pulmonary arteries are enlarged measuring 3.1 cm on the right and 2.5 cm on the left, similar the prior studies. Mediastinum/Nodes: Breaking scratched at paratracheal lymph nodes up to 9 mm are stable from the prior study. Subcentimeter prevascular nodes are present. No hilar mass is present. Lungs/Pleura: Centrilobular emphysematous changes are present. Small bilateral pleural effusions are present. No focal nodule or mass lesion is present. Mild dependent atelectasis is associated with the effusions. Upper Abdomen: The visualized upper abdomen is unremarkable. Musculoskeletal: No chest wall abnormality. No acute or significant osseous findings. IMPRESSION: 1. Small bilateral pleural effusions with mild dependent atelectasis. 2. No focal nodule or mass lesion. 3. Enlarged pulmonary arteries bilaterally compatible with pulmonary arterial hypertension. 4. Coronary artery disease. 5. Aortic Atherosclerosis (ICD10-I70.0) and Emphysema (ICD10-J43.9). Electronically Signed   By: Marin Roberts M.D.   On: 07/05/2023 15:50   DG Chest Portable 1 View  Result Date: 07/05/2023 CLINICAL DATA:  hypoxia EXAM: PORTABLE CHEST 1 VIEW COMPARISON:  Chest x-ray 02/04/2023, CT chest 09/23/2017 FINDINGS: The heart and mediastinal contours are within normal limits. Question right hilar mass. Interval increase in interstitial markings. Blunting of bilateral costophrenic angles with no definite pleural effusion. No pneumothorax. No  acute osseous abnormality.  Old healed right rib fractures. IMPRESSION: Question right hilar mass. Interval increase in interstitial markings. Recommend CT with intravenous contrast for further evaluation (not CTPA). Electronically Signed   By: Tish Frederickson M.D.   On: 07/05/2023 14:27   DG Wrist Navic Only Right  Result Date: 06/18/2023 X-ray report Chief complaint possible scaphoid fracture Images scaphoid only Reading: No evidence of scaphoid fracture Impression: Normal scaphoid distal radius fracture noted with ulnar styloid fracture see other films as noted   DG Wrist Complete Right  Result Date: 06/18/2023 X-ray report Chief complaint right wrist fracture right wrist pain Images 3 views right wrist Reading: Fracture distal radius metaphyseal slight shortening minimal angulation Impression: Distal radius fracture ulnar styloid also involved by the way typical Colles' type fracture   DG Wrist Complete Right  Result Date: 06/12/2023 CLINICAL DATA:  Fall. EXAM: RIGHT WRIST - COMPLETE 3+ VIEW COMPARISON:  None Available. FINDINGS: Acute transverse mildly impacted distal radial metaphyseal fracture with extension to the level of the distal radioulnar joint. Minimally displaced fracture at the base of the ulnar styloid. Subtle cortical irregularity at the waist of the scaphoid is concerning for a nondisplaced fracture. Normal carpal alignment. Diffuse soft tissue swelling of the distal forearm and wrist. No radiopaque foreign body. IMPRESSION: 1. Acute transverse mildly impacted distal radial metaphyseal fracture  with extension to the level of the distal radioulnar joint. 2. Minimally displaced fracture at the base of the ulnar styloid. 3. Subtle cortical irregularity at the waist of the scaphoid is concerning for a nondisplaced fracture. A CT could be obtained for further evaluation. Electronically Signed   By: Hart Robinsons M.D.   On: 06/12/2023 16:32    Catarina Hartshorn, DO  [facility]  If  7PM-7AM, please contact night-coverage [email or web address] Password [email or web address] 07/06/2023, 8:18 AM   LOS: 1 day

## 2023-07-06 NOTE — Progress Notes (Signed)
Safety observation orders renewed. Sitter at bedside.

## 2023-07-06 NOTE — Plan of Care (Signed)

## 2023-07-07 ENCOUNTER — Inpatient Hospital Stay (HOSPITAL_COMMUNITY): Payer: Medicare Other

## 2023-07-07 DIAGNOSIS — J9621 Acute and chronic respiratory failure with hypoxia: Secondary | ICD-10-CM | POA: Diagnosis not present

## 2023-07-07 DIAGNOSIS — G9341 Metabolic encephalopathy: Secondary | ICD-10-CM | POA: Diagnosis not present

## 2023-07-07 DIAGNOSIS — E871 Hypo-osmolality and hyponatremia: Secondary | ICD-10-CM | POA: Diagnosis not present

## 2023-07-07 DIAGNOSIS — F200 Paranoid schizophrenia: Secondary | ICD-10-CM | POA: Diagnosis not present

## 2023-07-07 LAB — BASIC METABOLIC PANEL
Anion gap: 7 (ref 5–15)
BUN: 8 mg/dL (ref 8–23)
CO2: 39 mmol/L — ABNORMAL HIGH (ref 22–32)
Calcium: 8.4 mg/dL — ABNORMAL LOW (ref 8.9–10.3)
Chloride: 84 mmol/L — ABNORMAL LOW (ref 98–111)
Creatinine, Ser: 0.41 mg/dL — ABNORMAL LOW (ref 0.44–1.00)
GFR, Estimated: >60 mL/min (ref >60)
Glucose, Bld: 135 mg/dL — ABNORMAL HIGH (ref 70–99)
Potassium: 4.2 mmol/L (ref 3.5–5.1)
Sodium: 130 mmol/L — ABNORMAL LOW (ref 135–145)

## 2023-07-07 LAB — RESPIRATORY PANEL BY PCR

## 2023-07-07 LAB — MAGNESIUM: Magnesium: 2.1 mg/dL (ref 1.7–2.4)

## 2023-07-07 LAB — CBC
HCT: 40.9 % (ref 36.0–46.0)
Hemoglobin: 13 g/dL (ref 12.0–15.0)
MCH: 29.4 pg (ref 26.0–34.0)
MCHC: 31.8 g/dL (ref 30.0–36.0)
MCV: 92.5 fL (ref 80.0–100.0)
Platelets: 299 10*3/uL (ref 150–400)
RBC: 4.42 MIL/uL (ref 3.87–5.11)
RDW: 14.2 % (ref 11.5–15.5)
WBC: 10.7 10*3/uL — ABNORMAL HIGH (ref 4.0–10.5)
nRBC: 0 % (ref 0.0–0.2)

## 2023-07-07 LAB — HEPARIN LEVEL (UNFRACTIONATED)
Heparin Unfractionated: 0.53 [IU]/mL (ref 0.30–0.70)
Heparin Unfractionated: 0.46 [IU]/mL (ref 0.30–0.70)

## 2023-07-07 LAB — PROCALCITONIN: Procalcitonin: <0.1 ng/mL

## 2023-07-07 LAB — SODIUM
Sodium: 130 mmol/L — ABNORMAL LOW (ref 135–145)
Sodium: 132 mmol/L — ABNORMAL LOW (ref 135–145)

## 2023-07-07 LAB — PHOSPHORUS: Phosphorus: 3.1 mg/dL (ref 2.5–4.6)

## 2023-07-07 MED ORDER — DOXYCYCLINE HYCLATE 100 MG PO TABS
100.0000 mg | ORAL_TABLET | Freq: Two times a day (BID) | ORAL | Status: DC
  Administered 2023-07-07 – 2023-07-09 (×5): 100 mg via ORAL
  Filled 2023-07-07 (×5): qty 1

## 2023-07-07 MED ORDER — METOPROLOL TARTRATE 25 MG PO TABS
25.0000 mg | ORAL_TABLET | Freq: Two times a day (BID) | ORAL | Status: DC
  Administered 2023-07-07 – 2023-07-09 (×5): 25 mg via ORAL
  Filled 2023-07-07 (×5): qty 1

## 2023-07-07 MED ORDER — IPRATROPIUM-ALBUTEROL 0.5-2.5 (3) MG/3ML IN SOLN
3.0000 mL | Freq: Three times a day (TID) | RESPIRATORY_TRACT | Status: DC
  Administered 2023-07-07: 3 mL via RESPIRATORY_TRACT
  Filled 2023-07-07: qty 3

## 2023-07-07 MED ORDER — RIVAROXABAN 20 MG PO TABS: 20.0000 mg | ORAL_TABLET | Freq: Every day | ORAL | Status: DC

## 2023-07-07 MED ORDER — ORAL CARE MOUTH RINSE: 15.0000 mL | OROMUCOSAL | Status: DC | PRN

## 2023-07-07 MED ORDER — RIVAROXABAN 20 MG PO TABS
20.0000 mg | ORAL_TABLET | Freq: Every day | ORAL | Status: DC
  Administered 2023-07-07 – 2023-07-08 (×2): 20 mg via ORAL
  Filled 2023-07-07 (×2): qty 1

## 2023-07-07 MED ORDER — PNEUMOCOCCAL 20-VAL CONJ VACC 0.5 ML IM SUSY
0.5000 mL | PREFILLED_SYRINGE | INTRAMUSCULAR | Status: DC
  Filled 2023-07-07: qty 0.5

## 2023-07-07 MED ORDER — IPRATROPIUM-ALBUTEROL 0.5-2.5 (3) MG/3ML IN SOLN
3.0000 mL | Freq: Three times a day (TID) | RESPIRATORY_TRACT | Status: DC
  Administered 2023-07-07 – 2023-07-08 (×2): 3 mL via RESPIRATORY_TRACT
  Filled 2023-07-07 (×2): qty 3

## 2023-07-07 MED ORDER — FUROSEMIDE 10 MG/ML IJ SOLN
40.0000 mg | Freq: Once | INTRAMUSCULAR | Status: AC
  Administered 2023-07-07: 40 mg via INTRAVENOUS
  Filled 2023-07-07: qty 4

## 2023-07-07 NOTE — Plan of Care (Signed)

## 2023-07-07 NOTE — Progress Notes (Signed)
PHARMACY - ANTICOAGULATION CONSULT NOTE  Pharmacy Consult for Heparin Infusion Indication: DVT  Allergies  Allergen Reactions   Phosphate Nausea And Vomiting   Chocolate Swelling    Facial swelling and bumps   Artane [Trihexyphenidyl] Other (See Comments)    unknown   Codeine Other (See Comments)    unknown    Patient Measurements: Height: 5\' 4"  (162.6 cm) Weight: 69.9 kg (154 lb 1.6 oz) IBW/kg (Calculated) : 54.7 Heparin Dosing Weight: 68.8 kg  Vital Signs: Temp: 98.4 F (36.9 C) (12/02 0734) Temp Source: Axillary (12/02 0734) BP: 169/80 (12/02 1002) Pulse Rate: 101 (12/02 1002)  Labs: Recent Labs    07/05/23 1349 07/06/23 0514 07/06/23 0821 07/06/23 1721 07/07/23 0103 07/07/23 0426 07/07/23 0935  HGB  --   --  12.9  --   --  13.0  --   HCT  --   --  40.1  --   --  40.9  --   PLT  --   --  282  --   --  299  --   APTT  --   --   --  29  --   --   --   LABPROT  --   --   --  14.0  --   --   --   INR  --   --   --  1.1  --   --   --   HEPARINUNFRC  --   --   --  <0.10* 0.53  --  0.46  CREATININE 0.43* 0.46  --   --   --  0.41*  --   CKTOTAL  --   --  37*  --   --   --   --   TROPONINIHS 5  --   --   --   --   --   --     Estimated Creatinine Clearance: 67.3 mL/min (A) (by C-G formula based on SCr of 0.41 mg/dL (L)).   Medical History: Past Medical History:  Diagnosis Date   Hypertension    Hyponatremia    Paranoid schizophrenia (HCC)    Pulmonary embolus (HCC)    Venous insufficiency of right lower extremity     Assessment: Patient is a 67 year old female with a past medical history of pulmonary embolus on the rivaroxaban, hypertension, pulmonary hypertension, paranoid schizophrenia, primary polydipsia, chronic respiratory failure on 3 L, tobacco abuse in remission presenting from [facility] secondary to altered mental status and shortness of breath. Patient's last dose of rivaroxaban was 11/29 @ 1700. Pharmacy was consulted to initiate patient on a  heparin infusion.  HL 0.46- therapeutic  Heparin level 0.3-0.7 units/ml Heparin level 66-102 units/ml Monitor platelets by anticoagulation protocol: Yes   Plan:  Continue heparin infusion at 1150 units/hr Heparin level daily Continue to monitor H&H and platelets.  Tad Moore, PharmD Clinical Pharmacist 07/07/2023,11:46 AM

## 2023-07-07 NOTE — Plan of Care (Signed)
Patient remains on AP-CCU at time of writing. Patient continues to have a Recruitment consultant at the bedside. Patient's admission profile completed overnight by this RN. Patient has been scored consistently as a "high falls risk"; however, no bed alarm nor yellow falls risk armband were in place upon my assumption of patient care. Falls risk bundle implemented by this RN. Also, upon change of shift, the patient was still receiving an IV infusion of Heparin for which the order had been set to expire at 1400 hours today. The infusion was stopped by this RN at 1930 hours and Dr. Thomes Dinning was notified via Secure Chat by this RN stating: "Just going to let you know, this patient was receiving IV heparin that should have dropped off at 1500 hrs (sic) today. When I came on at 1900 it was still infusing, it was never stopped, so she got a little more heparin than was planned. She also received PO Xarelto this evening... just keeping you in the loop". Not evidence of bleeding from the patient at this time. Furthermore, the patient also has a cast to the RUE that was apparently present upon arrival but, until this shift, had not been documented upon as having been assessed; no evidence of complications regarding the cast at this time.   Problem: Education: Goal: Knowledge of General Education information will improve Description: Including pain rating scale, medication(s)/side effects and non-pharmacologic comfort measures Outcome: Progressing   Problem: Health Behavior/Discharge Planning: Goal: Ability to manage health-related needs will improve Outcome: Progressing   Problem: Clinical Measurements: Goal: Ability to maintain clinical measurements within normal limits will improve Outcome: Progressing Goal: Will remain free from infection Outcome: Progressing Goal: Diagnostic test results will improve Outcome: Progressing Goal: Respiratory complications will improve Outcome: Progressing Goal: Cardiovascular  complication will be avoided Outcome: Progressing   Problem: Activity: Goal: Risk for activity intolerance will decrease Outcome: Progressing   Problem: Nutrition: Goal: Adequate nutrition will be maintained Outcome: Progressing   Problem: Coping: Goal: Level of anxiety will decrease Outcome: Progressing   Problem: Elimination: Goal: Will not experience complications related to bowel motility Outcome: Progressing Goal: Will not experience complications related to urinary retention Outcome: Progressing   Problem: Pain Management: Goal: General experience of comfort will improve Outcome: Progressing   Problem: Safety: Goal: Ability to remain free from injury will improve Outcome: Progressing   Problem: Skin Integrity: Goal: Risk for impaired skin integrity will decrease Outcome: Progressing

## 2023-07-07 NOTE — Progress Notes (Addendum)
PROGRESS NOTE  Nancy Erickson YQM:[id number] DOB: [birth year] DOA: 07/05/2023 PCP: Henrine Screws, MD  Brief History:  67 year old female with a history of pulmonary embolus on the rivaroxaban, hypertension, pulmonary hypertension, paranoid schizophrenia, primary polydipsia, chronic respiratory failure on 3 L, tobacco abuse in remission presenting from [facility] secondary to altered mental status and shortness of breath.  The patient was noted to have increasing confusion and nonsensical speech on 07/04/2023.  In the morning of 07/05/2023, the patient was found sitting on the floor of her room confused.  Apparently the patient "looked blue".  The patient has had noncompliance with her oxygen.  She frequently takes it off.  Patient continues to have problems with polydipsia.  caregiver confirms that pt continues to have problems with excessive water intake. He states at one point her primary MD had written orders to restrict her intake and the only way to comply was to turn the water source off to the sink in her bathroom. However, state inspection would not allow this to remain off.   In the ED, the patient was afebrile and hemodynamically stable.  Oxygen saturation was 59% on room air.  She was placed on nonrebreather.  Repeat ABG on 07/05/2023 1935 hrs. showed 7.29/85/75/40.  Because of increasing pCO2 and some somnolence, the patient was placed on BiPAP.  Apparently, the patient had periods of agitation overnight 11/30-12/1/24.  The patient was given Haldol 5 mg IV at 2028 and 0327.  She was given IV ativan 1 mg at 1908, 0005, 0405.  At the time of my evaluation on 07/06/2023, the patient remains on BiPAP.  She is somnolent.  She is arousable to protopathic stimuli.   Assessment/Plan: Acute on chronic respiratory failure with hypoxia -multifactorial including COPD exac, fluid overload and possible atypical pneumonia -Check PCT <0.10 x 2 -BNP 93 -12/2 chest x-ray--personally  reviewed--increased interstitial markings -Repeat ABG 7.25/99/68/43 -decision was made to defer intubation despite the 07/06/23 0845 ABG as the patient's BiPAP settings were not changed/adjusted since ABG 1935 on 07/05/23 and pt received multiple doses of ativan and haldol as discussed in my previous note  -07/05/2023 CT chest--small bilateral pleural effusions, emphysema, no frank consolidations, no mass -Currently on BiPAP>>now on 4L -Continue DuoNebs -COVID/RSV/flu--neg -viral resp panel -lasix IV x 1 40mg  -start empiric doxy   Acute metabolic encephalopathy -Likely multifactorial including hyponatremia, hypercarbia -Check ammonia--51 -B12--340 -TSH--0.947 -Repeat ABG -07/05/2023 CT brain negative for acute findings -Obtain UA--neg pyuria -UDS--neg -Discontinue hypnotic meds for now -improving   Hyponatremia -Presented with sodium 119>>130>>132 -Patient has chronic hyponatremia with baseline 125-130 -Discontinue sodium chloride tablets for now -Urine osmolarity 356 -Serum osmolarity 257 -FeNa 0.2%   History of pulmonary embolus -restart rivaroxaban  Essential hypertension -Holding amlodipine temporarily -restart metoprolol   Paranoid schizophrenia -Hold Risperdal temporarily as the patient is somnolent         Family Communication:  no  Family at bedside   Consultants:  none   Code Status:  FULL    DVT Prophylaxis:  Xarelto          Subjective: Patient denies fevers, chills, headache, chest pain, dyspnea, nausea, vomiting, diarrhea, abdominal pain,   Objective: Vitals:   07/07/23 0900 07/07/23 1000 07/07/23 1002 07/07/23 1200  BP:   (!) 169/80   Pulse: (!) 109 97 (!) 101   Resp: 19 17 19    Temp:    98.2 F (36.8 C)  TempSrc:    Axillary  SpO2: 100% 95% 96%   Weight:      Height:        Intake/Output Summary (Last 24 hours) at 07/07/2023 1334 Last data filed at 07/07/2023 1106 Gross per 24 hour  Intake 1109.19 ml  Output 3150 ml  Net  -2040.81 ml   Weight change:  Exam:  General:  Pt is alert, follows commands appropriately, not in acute distress HEENT: No icterus, No thrush, No neck mass, [place]/AT Cardiovascular: RRR, S1/S2, no rubs, no gallops +JVD Respiratory: bilateral crackles.  No wheeze Abdomen: Soft/+BS, non tender, non distended, no guarding Extremities: trace LE edema, No lymphangitis, No petechiae, No rashes, no synovitis   Data Reviewed: I have personally reviewed following labs and imaging studies Basic Metabolic Panel: Recent Labs  Lab 07/05/23 1349 07/05/23 2020 07/06/23 0514 07/06/23 0821 07/06/23 1721 07/06/23 2138 07/07/23 0103 07/07/23 0426 07/07/23 0935  NA 119*   < > 129*   < > 129* 128* 130* 130* 132*  K 4.0  --  4.1  --   --   --   --  4.2  --   CL 78*  --  85*  --   --   --   --  84*  --   CO2 33*  --  36*  --   --   --   --  39*  --   GLUCOSE 126*  --  134*  --   --   --   --  135*  --   BUN 10  --  8  --   --   --   --  8  --   CREATININE 0.43*  --  0.46  --   --   --   --  0.41*  --   CALCIUM 8.3*  --  8.5*  --   --   --   --  8.4*  --   MG  --   --   --   --   --   --   --  2.1  --   PHOS  --   --   --   --   --   --   --  3.1  --    < > = values in this interval not displayed.   Liver Function Tests: No results for input(s): "AST", "ALT", "ALKPHOS", "BILITOT", "PROT", "ALBUMIN" in the last 168 hours. No results for input(s): "LIPASE", "AMYLASE" in the last 168 hours. Recent Labs  Lab 07/06/23 0905  AMMONIA 51*   Coagulation Profile: Recent Labs  Lab 07/06/23 1721  INR 1.1   CBC: Recent Labs  Lab 07/06/23 0821 07/07/23 0426  WBC 6.6 10.7*  HGB 12.9 13.0  HCT 40.1 40.9  MCV 92.2 92.5  PLT 282 299   Cardiac Enzymes: Recent Labs  Lab 07/06/23 0821  CKTOTAL 37*   BNP: Invalid input(s): "POCBNP" CBG: No results for input(s): "GLUCAP" in the last 168 hours. HbA1C: No results for input(s): "HGBA1C" in the last 72 hours. Urine analysis:    Component  Value Date/Time   COLORURINE STRAW (A) 07/06/2023 0845   APPEARANCEUR CLEAR 07/06/2023 0845   LABSPEC 1.008 07/06/2023 0845   PHURINE 6.0 07/06/2023 0845   GLUCOSEU NEGATIVE 07/06/2023 0845   HGBUR NEGATIVE 07/06/2023 0845   BILIRUBINUR NEGATIVE 07/06/2023 0845   KETONESUR 5 (A) 07/06/2023 0845   PROTEINUR NEGATIVE 07/06/2023 0845   UROBILINOGEN 0.2 11/07/2013 2103   NITRITE NEGATIVE 07/06/2023 0845   LEUKOCYTESUR NEGATIVE  07/06/2023 0845   Sepsis Labs: @LABRCNTIP (procalcitonin:4,lacticidven:4) ) Recent Results (from the past 240 hour(s))  Resp panel by RT-PCR (RSV, Flu A&B, Covid) Anterior Nasal Swab     Status: None   Collection Time: 07/05/23  2:36 PM   Specimen: Anterior Nasal Swab  Result Value Ref Range Status   SARS Coronavirus 2 by RT PCR NEGATIVE NEGATIVE Final    Comment: (NOTE) SARS-CoV-2 target nucleic acids are NOT DETECTED.  The SARS-CoV-2 RNA is generally detectable in upper respiratory specimens during the acute phase of infection. The lowest concentration of SARS-CoV-2 viral copies this assay can detect is 138 copies/mL. A negative result does not preclude SARS-Cov-2 infection and should not be used as the sole basis for treatment or other patient management decisions. A negative result may occur with  improper specimen collection/handling, submission of specimen other than nasopharyngeal swab, presence of viral mutation(s) within the areas targeted by this assay, and inadequate number of viral copies(<138 copies/mL). A negative result must be combined with clinical observations, patient history, and epidemiological information. The expected result is Negative.  Fact Sheet for Patients:  BloggerCourse.com  Fact Sheet for Healthcare Providers:  SeriousBroker.it  This test is no t yet approved or cleared by the [place] FDA and  has been authorized for detection and/or diagnosis of SARS-CoV-2 by FDA  under an Emergency Use Authorization (EUA). This EUA will remain  in effect (meaning this test can be used) for the duration of the COVID-19 declaration under Section 564(b)(1) of the Act, 21 U.S.C.section 360bbb-3(b)(1), unless the authorization is terminated  or revoked sooner.       Influenza A by PCR NEGATIVE NEGATIVE Final   Influenza B by PCR NEGATIVE NEGATIVE Final    Comment: (NOTE) The Xpert Xpress SARS-CoV-2/FLU/RSV plus assay is intended as an aid in the diagnosis of influenza from Nasopharyngeal swab specimens and should not be used as a sole basis for treatment. Nasal washings and aspirates are unacceptable for Xpert Xpress SARS-CoV-2/FLU/RSV testing.  Fact Sheet for Patients: BloggerCourse.com  Fact Sheet for Healthcare Providers: SeriousBroker.it  This test is not yet approved or cleared by the [place] FDA and has been authorized for detection and/or diagnosis of SARS-CoV-2 by FDA under an Emergency Use Authorization (EUA). This EUA will remain in effect (meaning this test can be used) for the duration of the COVID-19 declaration under Section 564(b)(1) of the Act, 21 U.S.C. section 360bbb-3(b)(1), unless the authorization is terminated or revoked.     Resp Syncytial Virus by PCR NEGATIVE NEGATIVE Final    Comment: (NOTE) Fact Sheet for Patients: BloggerCourse.com  Fact Sheet for Healthcare Providers: SeriousBroker.it  This test is not yet approved or cleared by the [place] FDA and has been authorized for detection and/or diagnosis of SARS-CoV-2 by FDA under an Emergency Use Authorization (EUA). This EUA will remain in effect (meaning this test can be used) for the duration of the COVID-19 declaration under Section 564(b)(1) of the Act, 21 U.S.C. section 360bbb-3(b)(1), unless the authorization is terminated or revoked.  Performed at [facility], [address] [place], [place] [address]   MRSA Next Gen by PCR, Nasal     Status: None   Collection Time: 07/05/23  7:00 PM   Specimen: Nasal Mucosa; Nasal Swab  Result Value Ref Range Status   MRSA by PCR Next Gen NOT DETECTED NOT DETECTED Final    Comment: (NOTE) The GeneXpert MRSA Assay (FDA approved for NASAL specimens only), is one component of a  comprehensive MRSA colonization surveillance program. It is not intended to diagnose MRSA infection nor to guide or monitor treatment for MRSA infections. Test performance is not FDA approved in patients less than 18 years old. Performed at [facility], [address], [place], [place] [address]      Scheduled Meds:  benztropine  0.5 mg Oral Daily   Chlorhexidine Gluconate Cloth  6 each Topical Daily   doxycycline  100 mg Oral Q12H   furosemide  40 mg Intravenous Once   methylPREDNISolone (SOLU-MEDROL) injection  60 mg Intravenous Q12H   pantoprazole (PROTONIX) IV  40 mg Intravenous Q24H   rivaroxaban  20 mg Oral Daily   Continuous Infusions:  Procedures/Studies: DG CHEST PORT 1 VIEW  Result Date: 07/07/2023 CLINICAL DATA:  [id number] Acute respiratory failure with hypoxia and hypercarbia (HCC) [id number] EXAM: PORTABLE CHEST 1 VIEW COMPARISON:  07/06/2023 FINDINGS: Persistent hazy densities at the left lung base that could represent a small amount of pleural fluid and atelectasis. Coarse lung markings are similar to the previous examination. Heart size is upper limits of normal but stable. Negative for a pneumothorax. Old left rib fractures. IMPRESSION: 1. Minimal change from the recent comparison examination. Persistent left basilar densities. Electronically Signed   By: Richarda Overlie M.D.   On: 07/07/2023 07:56   DG CHEST PORT 1 VIEW  Result Date: 07/06/2023 CLINICAL DATA:  Acute respiratory failure with hypoxia and hypercarbia. EXAM: PORTABLE CHEST 1 VIEW COMPARISON:  One-view chest x-ray 07/05/2023.  CT chest 07/05/2023. FINDINGS:  Heart is mildly enlarged. Progressive bilateral pleural effusions and basilar airspace disease is present. Mild pulmonary vascular congestion is again noted. IMPRESSION: 1. Progressive bilateral pleural effusions and basilar airspace disease. 2. Mild pulmonary vascular congestion. Electronically Signed   By: Marin Roberts M.D.   On: 07/06/2023 08:39   CT Head Wo Contrast  Result Date: 07/05/2023 CLINICAL DATA:  Mental status change, unknown cause EXAM: CT HEAD WITHOUT CONTRAST TECHNIQUE: Contiguous axial images were obtained from the base of the skull through the vertex without intravenous contrast. RADIATION DOSE REDUCTION: This exam was performed according to the departmental dose-optimization program which includes automated exposure control, adjustment of the mA and/or kV according to patient size and/or use of iterative reconstruction technique. COMPARISON:  09/23/2017 FINDINGS: Brain: No evidence of acute infarction, hemorrhage, hydrocephalus, extra-axial collection or mass lesion/mass effect. Vascular: No hyperdense vessel or unexpected calcification. Skull: Normal. Negative for fracture or focal lesion. Sinuses/Orbits: No acute finding. Other: None. IMPRESSION: No acute intracranial findings. Electronically Signed   By: Duanne Guess D.O.   On: 07/05/2023 17:19   CT Chest W Contrast  Result Date: 07/05/2023 CLINICAL DATA:  Abnormal chest x-ray EXAM: CT CHEST WITH CONTRAST TECHNIQUE: Multidetector CT imaging of the chest was performed during intravenous contrast administration. RADIATION DOSE REDUCTION: This exam was performed according to the departmental dose-optimization program which includes automated exposure control, adjustment of the mA and/or kV according to patient size and/or use of iterative reconstruction technique. CONTRAST:  OMNIPAQUE IOHEXOL 300 MG/ML  SOLN COMPARISON:  One-view chest x-ray 07/05/2023. Two-view chest x-ray 02/04/2023. FINDINGS: Cardiovascular: The  heart size is normal. Coronary artery calcifications are present. Atherosclerotic calcifications are present the aortic arch great vessel origins. Pulmonary arteries are enlarged measuring 3.1 cm on the right and 2.5 cm on the left, similar the prior studies. Mediastinum/Nodes: Breaking scratched at paratracheal lymph nodes up to 9 mm are stable from the prior study. Subcentimeter prevascular nodes are present. No hilar mass is  present. Lungs/Pleura: Centrilobular emphysematous changes are present. Small bilateral pleural effusions are present. No focal nodule or mass lesion is present. Mild dependent atelectasis is associated with the effusions. Upper Abdomen: The visualized upper abdomen is unremarkable. Musculoskeletal: No chest wall abnormality. No acute or significant osseous findings. IMPRESSION: 1. Small bilateral pleural effusions with mild dependent atelectasis. 2. No focal nodule or mass lesion. 3. Enlarged pulmonary arteries bilaterally compatible with pulmonary arterial hypertension. 4. Coronary artery disease. 5. Aortic Atherosclerosis (ICD10-I70.0) and Emphysema (ICD10-J43.9). Electronically Signed   By: Marin Roberts M.D.   On: 07/05/2023 15:50   DG Chest Portable 1 View  Result Date: 07/05/2023 CLINICAL DATA:  hypoxia EXAM: PORTABLE CHEST 1 VIEW COMPARISON:  Chest x-ray 02/04/2023, CT chest 09/23/2017 FINDINGS: The heart and mediastinal contours are within normal limits. Question right hilar mass. Interval increase in interstitial markings. Blunting of bilateral costophrenic angles with no definite pleural effusion. No pneumothorax. No acute osseous abnormality.  Old healed right rib fractures. IMPRESSION: Question right hilar mass. Interval increase in interstitial markings. Recommend CT with intravenous contrast for further evaluation (not CTPA). Electronically Signed   By: Tish Frederickson M.D.   On: 07/05/2023 14:27   DG Wrist Navic Only Right  Result Date: 06/18/2023 X-ray report  Chief complaint possible scaphoid fracture Images scaphoid only Reading: No evidence of scaphoid fracture Impression: Normal scaphoid distal radius fracture noted with ulnar styloid fracture see other films as noted   DG Wrist Complete Right  Result Date: 06/18/2023 X-ray report Chief complaint right wrist fracture right wrist pain Images 3 views right wrist Reading: Fracture distal radius metaphyseal slight shortening minimal angulation Impression: Distal radius fracture ulnar styloid also involved by the way typical Colles' type fracture   DG Wrist Complete Right  Result Date: 06/12/2023 CLINICAL DATA:  Fall. EXAM: RIGHT WRIST - COMPLETE 3+ VIEW COMPARISON:  None Available. FINDINGS: Acute transverse mildly impacted distal radial metaphyseal fracture with extension to the level of the distal radioulnar joint. Minimally displaced fracture at the base of the ulnar styloid. Subtle cortical irregularity at the waist of the scaphoid is concerning for a nondisplaced fracture. Normal carpal alignment. Diffuse soft tissue swelling of the distal forearm and wrist. No radiopaque foreign body. IMPRESSION: 1. Acute transverse mildly impacted distal radial metaphyseal fracture with extension to the level of the distal radioulnar joint. 2. Minimally displaced fracture at the base of the ulnar styloid. 3. Subtle cortical irregularity at the waist of the scaphoid is concerning for a nondisplaced fracture. A CT could be obtained for further evaluation. Electronically Signed   By: Hart Robinsons M.D.   On: 06/12/2023 16:32    Catarina Hartshorn, DO  [facility]  If 7PM-7AM, please contact night-coverage [email or web address] Password [email or web address] 07/07/2023, 1:34 PM   LOS: 2 days

## 2023-07-07 NOTE — Evaluation (Signed)
Clinical/Bedside Swallow Evaluation Patient Details  Name: Nancy Erickson MRN: [id number] Date of Birth: 1956-01-05  Today's Date: 07/07/2023 Time: SLP Start Time (ACUTE ONLY): 1415 SLP Stop Time (ACUTE ONLY): 1443 SLP Time Calculation (min) (ACUTE ONLY): 28 min  Past Medical History:  Past Medical History:  Diagnosis Date   Hypertension    Hyponatremia    Paranoid schizophrenia (HCC)    Pulmonary embolus (HCC)    Venous insufficiency of right lower extremity    Past Surgical History: History reviewed. No pertinent surgical history. HPI:  67 year old female with a history of pulmonary embolus on the rivaroxaban, hypertension, pulmonary hypertension, paranoid schizophrenia, primary polydipsia, chronic respiratory failure on 3 L, tobacco abuse in remission presenting from [facility] secondary to altered mental status and shortness of breath.  The patient was noted to have increasing confusion and nonsensical speech on 07/04/2023.  In the morning of 07/05/2023, the patient was found sitting on the floor of her room confused.  Apparently the patient "looked blue".  The patient has had noncompliance with her oxygen.  She frequently takes it off.  Patient continues to have problems with polydipsia.    Assessment / Plan / Recommendation  Clinical Impression  Clinical swallow evaluation completed at bedside with sitter present. Sitter stated that Pt was noted to cough with PO intake earlier today and she suspects it is due to rate of self feeding. Nursing reported significant coughing last night during dinner meal and also noted likely due to impulsivity with intake. Oral motor examination is WNL. Pt is on a fluid restriction of 1200  cc per day. Pt was presented with ice chips, thin via cup/straw, puree, and regular textures and SLP provided tactile cues for rate and amount of intake for liquids. Pt did not exhibit coughing or other signs of aspiration, however Pt was only given small amounts at  at ime. SLP encouraged Pt to focus on taking small bites/sips and eat slowly to help prevent possible prandial aspiration. Will downgrade textures to D3/mech soft and continue thin liquids and staff was encouraged to only provide Pt with one food item at a time or to have Pt rest utensil down on the tray between each bite to control rate of intake. SLP will check back tomorrow. Suspect cognitive based dysphagia due to impulsivity. OK for PO medications whole with water. SLP Visit Diagnosis: Dysphagia, unspecified (R13.10)    Aspiration Risk  Mild aspiration risk    Diet Recommendation Dysphagia 3 (Mech soft);Thin liquid    Liquid Administration via: Cup;Straw Medication Administration: Whole meds with liquid Supervision: Patient able to self feed;Full supervision/cueing for compensatory strategies (Pt needs cues to decrease rate) Compensations: Slow rate;Small sips/bites Postural Changes: Seated upright at 90 degrees;Remain upright for at least 30 minutes after po intake    Other  Recommendations Oral Care Recommendations: Oral care BID;Staff/trained caregiver to provide oral care    Recommendations for follow up therapy are one component of a multi-disciplinary discharge planning process, led by the attending physician.  Recommendations may be updated based on patient status, additional functional criteria and insurance authorization.  Follow up Recommendations Follow physician's recommendations for discharge plan and follow up therapies      Assistance Recommended at Discharge    Functional Status Assessment Patient has had a recent decline in their functional status and demonstrates the ability to make significant improvements in function in a reasonable and predictable amount of time.  Frequency and Duration min 2x/week  1 week  Prognosis Prognosis for improved oropharyngeal function: Fair Barriers to Reach Goals: Cognitive deficits      Swallow Study   General Date of  Onset: 07/05/23 HPI: 67 year old female with a history of pulmonary embolus on the rivaroxaban, hypertension, pulmonary hypertension, paranoid schizophrenia, primary polydipsia, chronic respiratory failure on 3 L, tobacco abuse in remission presenting from [facility] secondary to altered mental status and shortness of breath.  The patient was noted to have increasing confusion and nonsensical speech on 07/04/2023.  In the morning of 07/05/2023, the patient was found sitting on the floor of her room confused.  Apparently the patient "looked blue".  The patient has had noncompliance with her oxygen.  She frequently takes it off.  Patient continues to have problems with polydipsia. Type of Study: Bedside Swallow Evaluation Previous Swallow Assessment: BSE 09/2017 Diet Prior to this Study: Regular;Thin liquids (Level 0) Temperature Spikes Noted: No Respiratory Status: Nasal cannula History of Recent Intubation: No Behavior/Cognition: Alert;Cooperative;Pleasant mood;Requires cueing Oral Cavity Assessment: Within Functional Limits Oral Care Completed by SLP: Recent completion by staff Oral Cavity - Dentition: Adequate natural dentition Vision: Functional for self-feeding Self-Feeding Abilities: Able to feed self Patient Positioning: Upright in bed Baseline Vocal Quality: Normal Volitional Cough: Strong Volitional Swallow: Able to elicit    Oral/Motor/Sensory Function Overall Oral Motor/Sensory Function: Within functional limits   Ice Chips Ice chips: Within functional limits Presentation: Spoon   Thin Liquid Thin Liquid: Within functional limits Presentation: Cup;Self Fed;Straw    Nectar Thick Nectar Thick Liquid: Not tested   Honey Thick Honey Thick Liquid: Not tested   Puree Puree: Within functional limits Presentation: Spoon   Solid     Solid: Within functional limits Presentation: Self Fed     Thank you,  Havery Moros, CCC-SLP [phone]  Jaylene Schrom 07/07/2023,2:52  PM

## 2023-07-07 NOTE — TOC Initial Note (Signed)
Transition of Care [facility]) - Initial/Assessment Note    Patient Details  Name: Nancy Erickson MRN: [id number] Date of Birth: 1956/03/25  Transition of Care [facility]) CM/SW Contact:    Elliot Gault, LCSW Phone Number: 07/07/2023, 11:13 AM  Clinical Narrative:                  Pt admitted from [facility]. Spoke with Oneal Grout today to update on pt's status. Per Meriam Sprague, pt is ambulatory at the [facility]. She does not need assistance in ADLs. Pt has O2 at the facility.   Meriam Sprague states that they will pick up pt when she is medically stable for dc. TOC will follow and assist.  Expected Discharge Plan: Assisted Living Barriers to Discharge: Continued Medical Work up   Patient Goals and CMS Choice Patient states their goals for this hospitalization and ongoing recovery are:: return to ALF          Expected Discharge Plan and Services In-house Referral: Clinical Social Work     Living arrangements for the past 2 months: Assisted Living Facility                                      Prior Living Arrangements/Services Living arrangements for the past 2 months: Assisted Living Facility Lives with:: Facility Resident Patient language and need for interpreter reviewed:: Yes Do you feel safe going back to the place where you live?: Yes      Need for Family Participation in Patient Care: No (Comment) Care giver support system in place?: Yes (comment)      Activities of Daily Living      Permission Sought/Granted                  Emotional Assessment       Orientation: : Oriented to Self, Oriented to Place, Oriented to Situation Alcohol / Substance Use: Not Applicable Psych Involvement: No (comment)  Admission diagnosis:  Hyponatremia [E87.1] Hypoxia [R09.02] Acute on chronic respiratory failure with hypoxia (HCC) [J96.21] Patient Active Problem List   Diagnosis Date Noted   Acute on chronic respiratory failure with hypoxia and hypercapnia (HCC)  07/06/2023   Acute on chronic respiratory failure with hypoxia (HCC) 07/05/2023   COPD (chronic obstructive pulmonary disease) (HCC) 02/08/2022   Hypoxemia 02/08/2022   Schizo affective schizophrenia (HCC) 02/08/2022   Encounter for intubation    Gastroesophageal reflux disease    Acute respiratory failure (HCC) 09/24/2017   Acute metabolic encephalopathy 09/23/2017   Insomnia    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   Cardiomyopathy (HCC)    Pulmonary hypertension (HCC)    Pulmonary embolus (HCC)    Paranoid schizophrenia (HCC)    Hypertension, uncontrolled 10/14/2014   Schizophrenia (HCC) 10/12/2013   Hyponatremia 10/12/2013   Altered mental state 10/12/2013   HTN (hypertension) 10/11/2013   Hypertensive urgency 10/11/2013   PCP:  Henrine Screws, MD Pharmacy:   [facility] [phone] - [place], [place] - [address] AT [facility] OF [address] [facility] [address]  [place] [address] Phone: [phone] Fax: [phone]  [facility] [place],  - [address] [place] [address] Phone: [phone] Fax: [phone]     Social Determinants of Health (SDOH) Social History: SDOH Screenings   Food Insecurity: Patient Unable To Answer (07/06/2023)  Housing: Patient Unable To Answer (07/06/2023)  Transportation Needs: Patient Unable To Answer (07/06/2023)  Utilities: Patient Unable To Answer (07/06/2023)  Tobacco Use: Medium Risk (07/05/2023)   SDOH Interventions:     Readmission Risk Interventions     No data to display

## 2023-07-07 NOTE — Progress Notes (Signed)
PHARMACY - ANTICOAGULATION CONSULT NOTE  Pharmacy Consult for Heparin Infusion Indication: VTE  Allergies  Allergen Reactions   Phosphate Nausea And Vomiting   Chocolate Swelling    Facial swelling and bumps   Artane [Trihexyphenidyl] Other (See Comments)    unknown   Codeine Other (See Comments)    unknown    Patient Measurements: Height: 5\' 4"  (162.6 cm) Weight: 69.9 kg (154 lb 1.6 oz) IBW/kg (Calculated) : 54.7 Heparin Dosing Weight: 68.8 kg  Vital Signs: Temp: 98 F (36.7 C) (12/02 0000) Temp Source: Oral (12/02 0000) BP: 136/85 (12/02 0300) Pulse Rate: 107 (12/02 0300)  Labs: Recent Labs    07/05/23 1349 07/06/23 0514 07/06/23 0821 07/06/23 1721 07/07/23 0103  HGB  --   --  12.9  --   --   HCT  --   --  40.1  --   --   PLT  --   --  282  --   --   APTT  --   --   --  29  --   LABPROT  --   --   --  14.0  --   INR  --   --   --  1.1  --   HEPARINUNFRC  --   --   --  <0.10* 0.53  CREATININE 0.43* 0.46  --   --   --   CKTOTAL  --   --  37*  --   --   TROPONINIHS 5  --   --   --   --     Estimated Creatinine Clearance: 67.3 mL/min (by C-G formula based on SCr of 0.46 mg/dL).   Medical History: Past Medical History:  Diagnosis Date   Hypertension    Hyponatremia    Paranoid schizophrenia (HCC)    Pulmonary embolus (HCC)    Venous insufficiency of right lower extremity     Assessment: Patient is a 67 year old female with a past medical history of pulmonary embolus on the rivaroxaban, hypertension, pulmonary hypertension, paranoid schizophrenia, primary polydipsia, chronic respiratory failure on 3 L, tobacco abuse in remission presenting from [facility] secondary to altered mental status and shortness of breath. Patient's last dose of rivaroxaban was 11/29 @ 1700. Pharmacy was consulted to initiate patient on a heparin infusion.  Baseline INR 1.1, aPTT 29, and heparin level <0.1. Hgb 12.9. PLT 282.  No signs/symptoms of bleeding noted in  chart.  12/2 AM update:  Initial heparin level therapeutic   Goal of Therapy:  Heparin level 0.3-0.7 units/ml Heparin level 66-102 units/ml Monitor platelets by anticoagulation protocol: Yes   Plan:  Cont heparin 1150 units/hr Heparin level with AM labs  Abran Duke, PharmD, BCPS Clinical Pharmacist Phone: [phone]

## 2023-07-08 ENCOUNTER — Other Ambulatory Visit (HOSPITAL_COMMUNITY): Payer: Self-pay | Admitting: *Deleted

## 2023-07-08 ENCOUNTER — Other Ambulatory Visit (HOSPITAL_COMMUNITY): Payer: Medicare Other

## 2023-07-08 DIAGNOSIS — J9622 Acute and chronic respiratory failure with hypercapnia: Secondary | ICD-10-CM | POA: Diagnosis not present

## 2023-07-08 DIAGNOSIS — E871 Hypo-osmolality and hyponatremia: Secondary | ICD-10-CM | POA: Diagnosis not present

## 2023-07-08 DIAGNOSIS — J9621 Acute and chronic respiratory failure with hypoxia: Secondary | ICD-10-CM | POA: Diagnosis not present

## 2023-07-08 DIAGNOSIS — G9341 Metabolic encephalopathy: Secondary | ICD-10-CM | POA: Diagnosis not present

## 2023-07-08 LAB — BASIC METABOLIC PANEL
Anion gap: 10 (ref 5–15)
BUN: 12 mg/dL (ref 8–23)
CO2: 42 mmol/L — ABNORMAL HIGH (ref 22–32)
Calcium: 8.7 mg/dL — ABNORMAL LOW (ref 8.9–10.3)
Chloride: 82 mmol/L — ABNORMAL LOW (ref 98–111)
Creatinine, Ser: 0.54 mg/dL (ref 0.44–1.00)
GFR, Estimated: >60 mL/min (ref >60)
Glucose, Bld: 143 mg/dL — ABNORMAL HIGH (ref 70–99)
Potassium: 3.8 mmol/L (ref 3.5–5.1)
Sodium: 134 mmol/L — ABNORMAL LOW (ref 135–145)

## 2023-07-08 LAB — CBC
HCT: 40.9 % (ref 36.0–46.0)
Hemoglobin: 12.9 g/dL (ref 12.0–15.0)
MCH: 29.5 pg (ref 26.0–34.0)
MCHC: 31.5 g/dL (ref 30.0–36.0)
MCV: 93.6 fL (ref 80.0–100.0)
Platelets: 298 10*3/uL (ref 150–400)
RBC: 4.37 MIL/uL (ref 3.87–5.11)
RDW: 14.2 % (ref 11.5–15.5)
WBC: 11.7 10*3/uL — ABNORMAL HIGH (ref 4.0–10.5)
nRBC: 0 % (ref 0.0–0.2)

## 2023-07-08 LAB — PHOSPHORUS: Phosphorus: 3.5 mg/dL (ref 2.5–4.6)

## 2023-07-08 LAB — MAGNESIUM: Magnesium: 2.1 mg/dL (ref 1.7–2.4)

## 2023-07-08 MED ORDER — FUROSEMIDE 10 MG/ML IJ SOLN
40.0000 mg | Freq: Once | INTRAMUSCULAR | Status: AC
  Administered 2023-07-08: 40 mg via INTRAVENOUS
  Filled 2023-07-08: qty 4

## 2023-07-08 MED ORDER — PREDNISONE 20 MG PO TABS
50.0000 mg | ORAL_TABLET | Freq: Every day | ORAL | Status: DC
  Administered 2023-07-09: 50 mg via ORAL
  Filled 2023-07-08: qty 1

## 2023-07-08 MED ORDER — IPRATROPIUM-ALBUTEROL 0.5-2.5 (3) MG/3ML IN SOLN
3.0000 mL | Freq: Two times a day (BID) | RESPIRATORY_TRACT | Status: DC
  Filled 2023-07-08: qty 3

## 2023-07-08 NOTE — Progress Notes (Signed)
PROGRESS NOTE  Nancy Erickson HYQ:[id number] DOB: [birth year] DOA: 07/05/2023 PCP: Henrine Screws, MD  Brief History:  67 year old female with a history of pulmonary embolus on the rivaroxaban, hypertension, pulmonary hypertension, paranoid schizophrenia, primary polydipsia, chronic respiratory failure on 3 L, tobacco abuse in remission presenting from [facility] secondary to altered mental status and shortness of breath.  The patient was noted to have increasing confusion and nonsensical speech on 07/04/2023.  In the morning of 07/05/2023, the patient was found sitting on the floor of her room confused.  Apparently the patient "looked blue".  The patient has had noncompliance with her oxygen.  She frequently takes it off.  Patient continues to have problems with polydipsia.  caregiver confirms that pt continues to have problems with excessive water intake. He states at one point her primary MD had written orders to restrict her intake and the only way to comply was to turn the water source off to the sink in her bathroom. However, state inspection would not allow this to remain off.   In the ED, the patient was afebrile and hemodynamically stable.  Oxygen saturation was 59% on room air.  She was placed on nonrebreather.  Repeat ABG on 07/05/2023 1935 hrs. showed 7.29/85/75/40.  Because of increasing pCO2 and some somnolence, the patient was placed on BiPAP.  Apparently, the patient had periods of agitation overnight 11/30-12/1/24.  The patient was given Haldol 5 mg IV at 2028 and 0327.  She was given IV ativan 1 mg at 1908, 0005, 0405.  At the time of my evaluation on 07/06/2023, the patient remains on BiPAP.  She is somnolent.  She is arousable to protopathic stimuli.   Assessment/Plan: Acute on chronic respiratory failure with hypoxia -multifactorial including COPD exac, fluid overload and possible atypical pneumonia -Check PCT <0.10 x 2 -BNP 93 -12/2 chest x-ray--personally  reviewed--increased interstitial markings -Repeat ABG 7.25/99/68/43 -decision was made to defer intubation despite the 07/06/23 0845 ABG as the patient's BiPAP settings were not changed/adjusted since ABG 1935 on 07/05/23 and pt received multiple doses of ativan and haldol as discussed in my previous note  -07/05/2023 CT chest--small bilateral pleural effusions, emphysema, no frank consolidations, no mass -Currently on BiPAP>>now on 4L>>2L -Continue DuoNebs -COVID/RSV/flu--neg -viral resp panel -lasix IV x 1 40mg --repeat 12/3 -startdd empiric doxy -Echo   Acute metabolic encephalopathy -Likely multifactorial including hyponatremia, hypercarbia -Check ammonia--51 -B12--340 -TSH--0.947 -07/05/2023 CT brain negative for acute findings -Obtain UA--neg pyuria -UDS--neg -Discontinue hypnotic meds for now -improving>>back to baseline   Hyponatremia -Presented with sodium 119>>130>>132>>134 -Patient has chronic hyponatremia with baseline 125-130 -Discontinue sodium chloride tablets for now -Urine osmolarity 356 -Serum osmolarity 257 -FeNa 0.2%   History of pulmonary embolus -restart rivaroxaban   Essential hypertension -Holding amlodipine temporarily -restart metoprolol   Paranoid schizophrenia -Hold Risperdal temporarily as the patient was somnolent         Family Communication:  no  Family at bedside   Consultants:  none   Code Status:  FULL    DVT Prophylaxis:  Xarelto                Subjective: Patient denies fevers, chills, headache, chest pain, dyspnea, nausea, vomiting, diarrhea, abdominal pain, dysuria, hematuria, hematochezia, and melena.   Objective: Vitals:   07/08/23 0850 07/08/23 0900 07/08/23 1156 07/08/23 1219  BP: (!) 155/64 (!) 153/58  (!) 147/76  Pulse: 96 97  73  Resp:  20  18  Temp:  97.8 F (36.6 C) (!) 97 F (36.1 C)  TempSrc:   Oral Oral  SpO2:  92%  95%  Weight:      Height:        Intake/Output Summary (Last 24 hours)  at 07/08/2023 1327 Last data filed at 07/08/2023 0949 Gross per 24 hour  Intake 1608.42 ml  Output 2650 ml  Net -1041.58 ml   Weight change:  Exam:  General:  Pt is alert, follows commands appropriately, not in acute distress HEENT: No icterus, No thrush, No neck mass, [place]/AT Cardiovascular: RRR, S1/S2, no rubs, no gallops Respiratory: bilateral rales.  No wheeze Abdomen: Soft/+BS, non tender, non distended, no guarding Extremities: trace LE edema, No lymphangitis, No petechiae, No rashes, no synovitis   Data Reviewed: I have personally reviewed following labs and imaging studies Basic Metabolic Panel: Recent Labs  Lab 07/05/23 1349 07/05/23 2020 07/06/23 0514 07/06/23 0821 07/06/23 2138 07/07/23 0103 07/07/23 0426 07/07/23 0935 07/08/23 0441  NA 119*   < > 129*   < > 128* 130* 130* 132* 134*  K 4.0  --  4.1  --   --   --  4.2  --  3.8  CL 78*  --  85*  --   --   --  84*  --  82*  CO2 33*  --  36*  --   --   --  39*  --  42*  GLUCOSE 126*  --  134*  --   --   --  135*  --  143*  BUN 10  --  8  --   --   --  8  --  12  CREATININE 0.43*  --  0.46  --   --   --  0.41*  --  0.54  CALCIUM 8.3*  --  8.5*  --   --   --  8.4*  --  8.7*  MG  --   --   --   --   --   --  2.1  --  2.1  PHOS  --   --   --   --   --   --  3.1  --  3.5   < > = values in this interval not displayed.   Liver Function Tests: No results for input(s): "AST", "ALT", "ALKPHOS", "BILITOT", "PROT", "ALBUMIN" in the last 168 hours. No results for input(s): "LIPASE", "AMYLASE" in the last 168 hours. Recent Labs  Lab 07/06/23 0905  AMMONIA 51*   Coagulation Profile: Recent Labs  Lab 07/06/23 1721  INR 1.1   CBC: Recent Labs  Lab 07/06/23 0821 07/07/23 0426 07/08/23 0441  WBC 6.6 10.7* 11.7*  HGB 12.9 13.0 12.9  HCT 40.1 40.9 40.9  MCV 92.2 92.5 93.6  PLT 282 299 298   Cardiac Enzymes: Recent Labs  Lab 07/06/23 0821  CKTOTAL 37*   BNP: Invalid input(s): "POCBNP" CBG: No results for  input(s): "GLUCAP" in the last 168 hours. HbA1C: No results for input(s): "HGBA1C" in the last 72 hours. Urine analysis:    Component Value Date/Time   COLORURINE STRAW (A) 07/06/2023 0845   APPEARANCEUR CLEAR 07/06/2023 0845   LABSPEC 1.008 07/06/2023 0845   PHURINE 6.0 07/06/2023 0845   GLUCOSEU NEGATIVE 07/06/2023 0845   HGBUR NEGATIVE 07/06/2023 0845   BILIRUBINUR NEGATIVE 07/06/2023 0845   KETONESUR 5 (A) 07/06/2023 0845   PROTEINUR NEGATIVE 07/06/2023 0845   UROBILINOGEN 0.2 11/07/2013 2103   NITRITE NEGATIVE 07/06/2023 0845  LEUKOCYTESUR NEGATIVE 07/06/2023 0845   Sepsis Labs: @LABRCNTIP (procalcitonin:4,lacticidven:4) ) Recent Results (from the past 240 hour(s))  Resp panel by RT-PCR (RSV, Flu A&B, Covid) Anterior Nasal Swab     Status: None   Collection Time: 07/05/23  2:36 PM   Specimen: Anterior Nasal Swab  Result Value Ref Range Status   SARS Coronavirus 2 by RT PCR NEGATIVE NEGATIVE Final    Comment: (NOTE) SARS-CoV-2 target nucleic acids are NOT DETECTED.  The SARS-CoV-2 RNA is generally detectable in upper respiratory specimens during the acute phase of infection. The lowest concentration of SARS-CoV-2 viral copies this assay can detect is 138 copies/mL. A negative result does not preclude SARS-Cov-2 infection and should not be used as the sole basis for treatment or other patient management decisions. A negative result may occur with  improper specimen collection/handling, submission of specimen other than nasopharyngeal swab, presence of viral mutation(s) within the areas targeted by this assay, and inadequate number of viral copies(<138 copies/mL). A negative result must be combined with clinical observations, patient history, and epidemiological information. The expected result is Negative.  Fact Sheet for Patients:  BloggerCourse.com  Fact Sheet for Healthcare Providers:  SeriousBroker.it  This test  is no t yet approved or cleared by the [place] FDA and  has been authorized for detection and/or diagnosis of SARS-CoV-2 by FDA under an Emergency Use Authorization (EUA). This EUA will remain  in effect (meaning this test can be used) for the duration of the COVID-19 declaration under Section 564(b)(1) of the Act, 21 U.S.C.section 360bbb-3(b)(1), unless the authorization is terminated  or revoked sooner.       Influenza A by PCR NEGATIVE NEGATIVE Final   Influenza B by PCR NEGATIVE NEGATIVE Final    Comment: (NOTE) The Xpert Xpress SARS-CoV-2/FLU/RSV plus assay is intended as an aid in the diagnosis of influenza from Nasopharyngeal swab specimens and should not be used as a sole basis for treatment. Nasal washings and aspirates are unacceptable for Xpert Xpress SARS-CoV-2/FLU/RSV testing.  Fact Sheet for Patients: BloggerCourse.com  Fact Sheet for Healthcare Providers: SeriousBroker.it  This test is not yet approved or cleared by the [place] FDA and has been authorized for detection and/or diagnosis of SARS-CoV-2 by FDA under an Emergency Use Authorization (EUA). This EUA will remain in effect (meaning this test can be used) for the duration of the COVID-19 declaration under Section 564(b)(1) of the Act, 21 U.S.C. section 360bbb-3(b)(1), unless the authorization is terminated or revoked.     Resp Syncytial Virus by PCR NEGATIVE NEGATIVE Final    Comment: (NOTE) Fact Sheet for Patients: BloggerCourse.com  Fact Sheet for Healthcare Providers: SeriousBroker.it  This test is not yet approved or cleared by the [place] FDA and has been authorized for detection and/or diagnosis of SARS-CoV-2 by FDA under an Emergency Use Authorization (EUA). This EUA will remain in effect (meaning this test can be used) for the duration of the COVID-19 declaration under Section  564(b)(1) of the Act, 21 U.S.C. section 360bbb-3(b)(1), unless the authorization is terminated or revoked.  Performed at [facility], [address], [place], [place] [address]   MRSA Next Gen by PCR, Nasal     Status: None   Collection Time: 07/05/23  7:00 PM   Specimen: Nasal Mucosa; Nasal Swab  Result Value Ref Range Status   MRSA by PCR Next Gen NOT DETECTED NOT DETECTED Final    Comment: (NOTE) The GeneXpert MRSA Assay (FDA approved for NASAL specimens only), is one component  of a comprehensive MRSA colonization surveillance program. It is not intended to diagnose MRSA infection nor to guide or monitor treatment for MRSA infections. Test performance is not FDA approved in patients less than 68 years old. Performed at [facility], [address], [place], [place] [address]   Respiratory (~20 pathogens) panel by PCR     Status: None   Collection Time: 07/07/23  2:00 PM   Specimen: Nasopharyngeal Swab; Respiratory  Result Value Ref Range Status   Adenovirus NOT DETECTED NOT DETECTED Final   Coronavirus 229E NOT DETECTED NOT DETECTED Final    Comment: (NOTE) The Coronavirus on the Respiratory Panel, DOES NOT test for the novel  Coronavirus (2019 nCoV)    Coronavirus HKU1 NOT DETECTED NOT DETECTED Final   Coronavirus NL63 NOT DETECTED NOT DETECTED Final   Coronavirus OC43 NOT DETECTED NOT DETECTED Final   Metapneumovirus NOT DETECTED NOT DETECTED Final   Rhinovirus / Enterovirus NOT DETECTED NOT DETECTED Final   Influenza A NOT DETECTED NOT DETECTED Final   Influenza B NOT DETECTED NOT DETECTED Final   Parainfluenza Virus 1 NOT DETECTED NOT DETECTED Final   Parainfluenza Virus 2 NOT DETECTED NOT DETECTED Final   Parainfluenza Virus 3 NOT DETECTED NOT DETECTED Final   Parainfluenza Virus 4 NOT DETECTED NOT DETECTED Final   Respiratory Syncytial Virus NOT DETECTED NOT DETECTED Final   Bordetella pertussis NOT DETECTED NOT DETECTED Final   Bordetella Parapertussis NOT DETECTED  NOT DETECTED Final   Chlamydophila pneumoniae NOT DETECTED NOT DETECTED Final   Mycoplasma pneumoniae NOT DETECTED NOT DETECTED Final    Comment: Performed at [facility], [address], [place], [place] [address]     Scheduled Meds:  benztropine  0.5 mg Oral Daily   Chlorhexidine Gluconate Cloth  6 each Topical Daily   doxycycline  100 mg Oral Q12H   ipratropium-albuterol  3 mL Nebulization TID   methylPREDNISolone (SOLU-MEDROL) injection  60 mg Intravenous Q12H   metoprolol tartrate  25 mg Oral BID   pantoprazole (PROTONIX) IV  40 mg Intravenous Q24H   pneumococcal 20-valent conjugate vaccine  0.5 mL Intramuscular Tomorrow-1000   rivaroxaban  20 mg Oral Q supper   Continuous Infusions:  Procedures/Studies: DG CHEST PORT 1 VIEW  Result Date: 07/07/2023 CLINICAL DATA:  [id number] Acute respiratory failure with hypoxia and hypercarbia (HCC) [id number] EXAM: PORTABLE CHEST 1 VIEW COMPARISON:  07/06/2023 FINDINGS: Persistent hazy densities at the left lung base that could represent a small amount of pleural fluid and atelectasis. Coarse lung markings are similar to the previous examination. Heart size is upper limits of normal but stable. Negative for a pneumothorax. Old left rib fractures. IMPRESSION: 1. Minimal change from the recent comparison examination. Persistent left basilar densities. Electronically Signed   By: Richarda Overlie M.D.   On: 07/07/2023 07:56   DG CHEST PORT 1 VIEW  Result Date: 07/06/2023 CLINICAL DATA:  Acute respiratory failure with hypoxia and hypercarbia. EXAM: PORTABLE CHEST 1 VIEW COMPARISON:  One-view chest x-ray 07/05/2023.  CT chest 07/05/2023. FINDINGS: Heart is mildly enlarged. Progressive bilateral pleural effusions and basilar airspace disease is present. Mild pulmonary vascular congestion is again noted. IMPRESSION: 1. Progressive bilateral pleural effusions and basilar airspace disease. 2. Mild pulmonary vascular congestion. Electronically Signed   By:  Marin Roberts M.D.   On: 07/06/2023 08:39   CT Head Wo Contrast  Result Date: 07/05/2023 CLINICAL DATA:  Mental status change, unknown cause EXAM: CT HEAD WITHOUT CONTRAST TECHNIQUE: Contiguous axial images  were obtained from the base of the skull through the vertex without intravenous contrast. RADIATION DOSE REDUCTION: This exam was performed according to the departmental dose-optimization program which includes automated exposure control, adjustment of the mA and/or kV according to patient size and/or use of iterative reconstruction technique. COMPARISON:  09/23/2017 FINDINGS: Brain: No evidence of acute infarction, hemorrhage, hydrocephalus, extra-axial collection or mass lesion/mass effect. Vascular: No hyperdense vessel or unexpected calcification. Skull: Normal. Negative for fracture or focal lesion. Sinuses/Orbits: No acute finding. Other: None. IMPRESSION: No acute intracranial findings. Electronically Signed   By: Duanne Guess D.O.   On: 07/05/2023 17:19   CT Chest W Contrast  Result Date: 07/05/2023 CLINICAL DATA:  Abnormal chest x-ray EXAM: CT CHEST WITH CONTRAST TECHNIQUE: Multidetector CT imaging of the chest was performed during intravenous contrast administration. RADIATION DOSE REDUCTION: This exam was performed according to the departmental dose-optimization program which includes automated exposure control, adjustment of the mA and/or kV according to patient size and/or use of iterative reconstruction technique. CONTRAST:  OMNIPAQUE IOHEXOL 300 MG/ML  SOLN COMPARISON:  One-view chest x-ray 07/05/2023. Two-view chest x-ray 02/04/2023. FINDINGS: Cardiovascular: The heart size is normal. Coronary artery calcifications are present. Atherosclerotic calcifications are present the aortic arch great vessel origins. Pulmonary arteries are enlarged measuring 3.1 cm on the right and 2.5 cm on the left, similar the prior studies. Mediastinum/Nodes: Breaking scratched at paratracheal  lymph nodes up to 9 mm are stable from the prior study. Subcentimeter prevascular nodes are present. No hilar mass is present. Lungs/Pleura: Centrilobular emphysematous changes are present. Small bilateral pleural effusions are present. No focal nodule or mass lesion is present. Mild dependent atelectasis is associated with the effusions. Upper Abdomen: The visualized upper abdomen is unremarkable. Musculoskeletal: No chest wall abnormality. No acute or significant osseous findings. IMPRESSION: 1. Small bilateral pleural effusions with mild dependent atelectasis. 2. No focal nodule or mass lesion. 3. Enlarged pulmonary arteries bilaterally compatible with pulmonary arterial hypertension. 4. Coronary artery disease. 5. Aortic Atherosclerosis (ICD10-I70.0) and Emphysema (ICD10-J43.9). Electronically Signed   By: Marin Roberts M.D.   On: 07/05/2023 15:50   DG Chest Portable 1 View  Result Date: 07/05/2023 CLINICAL DATA:  hypoxia EXAM: PORTABLE CHEST 1 VIEW COMPARISON:  Chest x-ray 02/04/2023, CT chest 09/23/2017 FINDINGS: The heart and mediastinal contours are within normal limits. Question right hilar mass. Interval increase in interstitial markings. Blunting of bilateral costophrenic angles with no definite pleural effusion. No pneumothorax. No acute osseous abnormality.  Old healed right rib fractures. IMPRESSION: Question right hilar mass. Interval increase in interstitial markings. Recommend CT with intravenous contrast for further evaluation (not CTPA). Electronically Signed   By: Tish Frederickson M.D.   On: 07/05/2023 14:27   DG Wrist Navic Only Right  Result Date: 06/18/2023 X-ray report Chief complaint possible scaphoid fracture Images scaphoid only Reading: No evidence of scaphoid fracture Impression: Normal scaphoid distal radius fracture noted with ulnar styloid fracture see other films as noted   DG Wrist Complete Right  Result Date: 06/18/2023 X-ray report Chief complaint right wrist  fracture right wrist pain Images 3 views right wrist Reading: Fracture distal radius metaphyseal slight shortening minimal angulation Impression: Distal radius fracture ulnar styloid also involved by the way typical Colles' type fracture   DG Wrist Complete Right  Result Date: 06/12/2023 CLINICAL DATA:  Fall. EXAM: RIGHT WRIST - COMPLETE 3+ VIEW COMPARISON:  None Available. FINDINGS: Acute transverse mildly impacted distal radial metaphyseal fracture with extension to the level of the distal  radioulnar joint. Minimally displaced fracture at the base of the ulnar styloid. Subtle cortical irregularity at the waist of the scaphoid is concerning for a nondisplaced fracture. Normal carpal alignment. Diffuse soft tissue swelling of the distal forearm and wrist. No radiopaque foreign body. IMPRESSION: 1. Acute transverse mildly impacted distal radial metaphyseal fracture with extension to the level of the distal radioulnar joint. 2. Minimally displaced fracture at the base of the ulnar styloid. 3. Subtle cortical irregularity at the waist of the scaphoid is concerning for a nondisplaced fracture. A CT could be obtained for further evaluation. Electronically Signed   By: Hart Robinsons M.D.   On: 06/12/2023 16:32    Catarina Hartshorn, DO  [facility]  If 7PM-7AM, please contact night-coverage [email or web address] Password [email or web address] 07/08/2023, 1:27 PM   LOS: 3 days

## 2023-07-08 NOTE — Progress Notes (Signed)
Mobility Specialist Progress Note:    07/08/23 1600  Mobility  Activity Ambulated with assistance to bathroom;Stood at bedside (Stood at sink)  Level of Assistance Minimal assist, patient does 75% or more  Assistive Device None  Distance Ambulated (ft) 20 ft  Range of Motion/Exercises Active;All extremities  Activity Response Tolerated well  Mobility Referral Yes  $Mobility charge 1 Mobility  Mobility Specialist Start Time (ACUTE ONLY) 1500  Mobility Specialist Stop Time (ACUTE ONLY) 1520  Mobility Specialist Time Calculation (min) (ACUTE ONLY) 20 min   Pt received in chair, requesting assistance to bathroom. Required MinA to stand and ambulate with no AD. Tolerated well, asx throughout. Returned to chair, alarm on and call bell in reach. All needs met.  Lawerance Bach Mobility Specialist Please contact via Special educational needs teacher or  Rehab office at [phone]

## 2023-07-08 NOTE — Progress Notes (Signed)
Speech Language Pathology Treatment: Dysphagia  Patient Details Name: Nancy Erickson MRN: [id number] DOB: 1956-01-03 Today's Date: 07/08/2023 Time: 4098-1191 SLP Time Calculation (min) (ACUTE ONLY): 14 min  Assessment / Plan / Recommendation Clinical Impression  Pt seen for brief f/u dysphagia tx with min verbal cues provided for safety/swallowing precautions during meals such as small bites/sips, slow rate and min distractions within environment with sitter eliminated since last tx session.  Pt consumed snack of Dysphagia 3/thin liquids via straw without overt s/sx of aspiration present.  Adequate mastication noted and timely swallow.  Pt able to reiterate precautions for decreasing rate during intake and taking smaller bites/volumes of liquids during meals.  Pt's speech was difficult to discern intermittently d/t telegraphic type speech pattern.  Encouraged pt to use shorter phrases with shared context/visual cues to increase communicative competency.  Nursing staff noted pt did not exhibit any difficulty with morning tray/food regarding impulsivity/behavior impacting swallowing function.  Continue current diet of Dysphagia 3/thin liquids with intermittent supervision during meals/snacks.  ST will s/o in acute setting.    HPI HPI: 67 year old female with a history of pulmonary embolus on the rivaroxaban, hypertension, pulmonary hypertension, paranoid schizophrenia, primary polydipsia, chronic respiratory failure on 3 L, tobacco abuse in remission presenting from [facility] secondary to altered mental status and shortness of breath. The patient was noted to have increasing confusion and nonsensical speech on 07/04/2023. In the morning of 07/05/2023, the patient was found sitting on the floor of her room confused. Apparently the patient "looked blue". The patient has had noncompliance with her oxygen. She frequently takes it off. Patient continues to have problems with polydipsia. ST f/u for diet check  after BSE completed with Dysphagia 3/thin recommendation d/t impulsivity.      SLP Plan  All goals met      Recommendations for follow up therapy are one component of a multi-disciplinary discharge planning process, led by the attending physician.  Recommendations may be updated based on patient status, additional functional criteria and insurance authorization.    Recommendations  Diet recommendations: Dysphagia 3 (mechanical soft);Thin liquid Liquids provided via: Cup;Straw Medication Administration: Whole meds with liquid Supervision: Patient able to self feed;Intermittent supervision to cue for compensatory strategies Compensations: Slow rate;Small sips/bites Postural Changes and/or Swallow Maneuvers: Seated upright 90 degrees                  Oral care BID;Staff/trained caregiver to provide oral care     Dysphagia, unspecified (R13.10)     All goals met     Pat Assad Harbeson,M.S.,CCC-SLP  07/08/2023, 12:51 PM

## 2023-07-09 ENCOUNTER — Inpatient Hospital Stay (HOSPITAL_COMMUNITY): Payer: Medicare Other

## 2023-07-09 DIAGNOSIS — J441 Chronic obstructive pulmonary disease with (acute) exacerbation: Secondary | ICD-10-CM | POA: Diagnosis not present

## 2023-07-09 DIAGNOSIS — J9621 Acute and chronic respiratory failure with hypoxia: Secondary | ICD-10-CM | POA: Diagnosis not present

## 2023-07-09 DIAGNOSIS — R0609 Other forms of dyspnea: Secondary | ICD-10-CM | POA: Diagnosis not present

## 2023-07-09 DIAGNOSIS — I10 Essential (primary) hypertension: Secondary | ICD-10-CM

## 2023-07-09 DIAGNOSIS — F200 Paranoid schizophrenia: Secondary | ICD-10-CM | POA: Diagnosis not present

## 2023-07-09 DIAGNOSIS — G9341 Metabolic encephalopathy: Secondary | ICD-10-CM | POA: Diagnosis not present

## 2023-07-09 DIAGNOSIS — K219 Gastro-esophageal reflux disease without esophagitis: Secondary | ICD-10-CM

## 2023-07-09 LAB — CBC
HCT: 41.2 % (ref 36.0–46.0)
Hemoglobin: 13.2 g/dL (ref 12.0–15.0)
MCH: 29.9 pg (ref 26.0–34.0)
MCHC: 32 g/dL (ref 30.0–36.0)
MCV: 93.2 fL (ref 80.0–100.0)
Platelets: 305 10*3/uL (ref 150–400)
RBC: 4.42 MIL/uL (ref 3.87–5.11)
RDW: 14.3 % (ref 11.5–15.5)
WBC: 10.7 10*3/uL — ABNORMAL HIGH (ref 4.0–10.5)
nRBC: 0 % (ref 0.0–0.2)

## 2023-07-09 LAB — ECHOCARDIOGRAM COMPLETE
AR max vel: 2.3 cm2
AV Area VTI: 2.31 cm2
AV Area mean vel: 2.26 cm2
AV Mean grad: 4 mm[Hg]
AV Peak grad: 7.2 mm[Hg]
Ao pk vel: 1.34 m/s
Area-P 1/2: 3.46 cm2
Calc EF: 57 %
Height: 64 in
S' Lateral: 4.1 cm
Single Plane A2C EF: 46.3 %
Single Plane A4C EF: 61.7 %
Weight: 2472.68 [oz_av]

## 2023-07-09 LAB — LEGIONELLA PNEUMOPHILA SEROGP 1 UR AG: L. pneumophila Serogp 1 Ur Ag: NEGATIVE

## 2023-07-09 LAB — BASIC METABOLIC PANEL
Anion gap: 8 (ref 5–15)
BUN: 14 mg/dL (ref 8–23)
CO2: 40 mmol/L — ABNORMAL HIGH (ref 22–32)
Calcium: 8.6 mg/dL — ABNORMAL LOW (ref 8.9–10.3)
Chloride: 86 mmol/L — ABNORMAL LOW (ref 98–111)
Creatinine, Ser: 0.59 mg/dL (ref 0.44–1.00)
GFR, Estimated: >60 mL/min (ref >60)
Glucose, Bld: 92 mg/dL (ref 70–99)
Potassium: 3.5 mmol/L (ref 3.5–5.1)
Sodium: 134 mmol/L — ABNORMAL LOW (ref 135–145)

## 2023-07-09 MED ORDER — DOXYCYCLINE HYCLATE 100 MG PO TABS: 100.0000 mg | ORAL_TABLET | Freq: Two times a day (BID) | ORAL | 0 refills | Status: AC

## 2023-07-09 MED ORDER — PANTOPRAZOLE SODIUM 40 MG PO TBEC: 40.0000 mg | DELAYED_RELEASE_TABLET | Freq: Two times a day (BID) | ORAL | 1 refills | Status: AC

## 2023-07-09 MED ORDER — SODIUM CHLORIDE 1 G PO TABS: 1.0000 g | ORAL_TABLET | Freq: Every day | ORAL | Status: AC

## 2023-07-09 MED ORDER — PREDNISONE 20 MG PO TABS: ORAL_TABLET | ORAL | 0 refills | Status: AC

## 2023-07-09 MED ORDER — AMLODIPINE BESYLATE 5 MG PO TABS: 5.0000 mg | ORAL_TABLET | Freq: Every day | ORAL | 1 refills | Status: AC

## 2023-07-09 MED ORDER — PREDNISONE 20 MG PO TABS: ORAL_TABLET | ORAL | 0 refills | Status: DC

## 2023-07-09 NOTE — TOC Transition Note (Signed)
Transition of Care [facility]) - CM/SW Discharge Note   Patient Details  Name: Nancy Erickson MRN: [id number] Date of Birth: Dec 27, 1955  [facility] of [facility] [place] CM/SW Contact:  Karn Cassis, LCSW Phone Number: 07/09/2023, 2:55 PM   Clinical Narrative:  Pt d/c today back to [facility]. [name] at facility aware and agreeable. She reports they do not have transportation today. [name] aware per MD, pt can return to normal O2 needs- PRN during day and 2L at night. This is also on FL2. Pt's brother can pick up pt this afternoon and take her to facility. FL2 and D/C summary sent to [facility]. RN updated.      Final next level of care: Group Home Barriers to Discharge: Barriers Resolved   Patient Goals and CMS Choice      Discharge Placement                  Patient to be transferred to facility by: brother Name of family member notified: [name]- brother Patient and family notified of of transfer: 07/09/23  Discharge Plan and Services Additional resources added to the After Visit Summary for   In-house Referral: Clinical Social Work                                   Social Determinants of Health (SDOH) Interventions SDOH Screenings   Food Insecurity: Patient Unable To Answer (07/06/2023)  Housing: Patient Unable To Answer (07/06/2023)  Transportation Needs: Patient Unable To Answer (07/06/2023)  Utilities: Patient Unable To Answer (07/06/2023)  Tobacco Use: Medium Risk (07/05/2023)     Readmission Risk Interventions     No data to display

## 2023-07-09 NOTE — [place] FL2 (Deleted)
[place] MEDICAID FL2 LEVEL OF CARE FORM     IDENTIFICATION  Patient Name: Nancy Erickson Birthdate: July 19, 1956 Sex: female Admission Date (Current Location): 07/05/2023  [place] Number:  Aaron Edelman [id number] L Facility and Address:  [facility],  [address] [place] [address]      Provider Number: [id number]  Attending Physician Name and Address:  Vassie Loll, MD  Relative Name and Phone Number:       Current Level of Care: Hospital Recommended Level of Care: [facility] Prior Approval Number:    Date Approved/Denied:   PASRR Number:    Discharge Plan: Domiciliary (Rest home)    Current Diagnoses: Patient Active Problem List   Diagnosis Date Noted   Acute on chronic respiratory failure with hypoxia and hypercapnia (HCC) 07/06/2023   Acute on chronic respiratory failure with hypoxia (HCC) 07/05/2023   COPD (chronic obstructive pulmonary disease) (HCC) 02/08/2022   Hypoxemia 02/08/2022   Schizo affective schizophrenia (HCC) 02/08/2022   Encounter for intubation    Gastroesophageal reflux disease    Acute respiratory failure (HCC) 09/24/2017   Acute metabolic encephalopathy 09/23/2017   Insomnia    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   Cardiomyopathy (HCC)    Pulmonary hypertension (HCC)    Pulmonary embolus (HCC)    Paranoid schizophrenia (HCC)    Hypertension, uncontrolled 10/14/2014   Schizophrenia (HCC) 10/12/2013   Hyponatremia 10/12/2013   Altered mental state 10/12/2013   HTN (hypertension) 10/11/2013   Hypertensive urgency 10/11/2013    Orientation RESPIRATION BLADDER Height & Weight     Self, Time, Situation, Place  O2 (PRN during day, 2L at night) Incontinent Weight: 154 lb 8.7 oz (70.1 kg) Height:  5\' 4"  (162.6 cm)  BEHAVIORAL SYMPTOMS/MOOD NEUROLOGICAL BOWEL NUTRITION STATUS      Continent Diet (heart healthy)  AMBULATORY STATUS COMMUNICATION OF NEEDS Skin   Limited Assist Verbally Normal                        Personal Care Assistance Level of Assistance  Bathing, Feeding, Dressing Bathing Assistance: Limited assistance Feeding assistance: Limited assistance Dressing Assistance: Limited assistance     Functional Limitations Info  Sight, Hearing, Speech Sight Info: Adequate Hearing Info: Adequate Speech Info: Adequate    SPECIAL CARE FACTORS FREQUENCY                       Contractures Contractures Info: Not present    Additional Factors Info  Code Status, Allergies, Psychotropic Code Status Info: Full Allergies Info: Phosphate, Chocolate, Artane, Codeine Psychotropic Info: Risperdal         Current Medications (07/09/2023):  This is the current hospital active medication list Current Facility-Administered Medications  Medication Dose Route Frequency Provider Last Rate Last Admin   acetaminophen (TYLENOL) tablet 650 mg  650 mg Oral Q6H PRN Tat, David, MD       Or   acetaminophen (TYLENOL) suppository 650 mg  650 mg Rectal Q6H PRN Tat, Onalee Hua, MD       benztropine (COGENTIN) tablet 0.5 mg  0.5 mg Oral Daily Tat, David, MD   0.5 mg at 07/09/23 4696   Chlorhexidine Gluconate Cloth 2 % PADS 6 each  6 each Topical Daily Tat, David, MD   6 each at 07/08/23 0404   doxycycline (VIBRA-TABS) tablet 100 mg  100 mg Oral Pablo Ledger, MD   100 mg at 07/09/23 0856   haloperidol lactate (HALDOL) injection 5 mg  5 mg Intravenous Q6H PRN Tat, Onalee Hua, MD   5 mg at 07/06/23 0327   ipratropium-albuterol (DUONEB) 0.5-2.5 (3) MG/3ML nebulizer solution 3 mL  3 mL Nebulization Q4H PRN Tat, Onalee Hua, MD       metoprolol tartrate (LOPRESSOR) tablet 25 mg  25 mg Oral BID Tat, David, MD   25 mg at 07/09/23 0855   ondansetron (ZOFRAN) tablet 4 mg  4 mg Oral Q6H PRN Tat, Onalee Hua, MD       Or   ondansetron (ZOFRAN) injection 4 mg  4 mg Intravenous Q6H PRN Tat, Onalee Hua, MD       Oral care mouth rinse  15 mL Mouth Rinse PRN Tat, Onalee Hua, MD       pantoprazole (PROTONIX) injection 40 mg  40 mg  Intravenous Q24H Tat, Onalee Hua, MD   40 mg at 07/09/23 0856   pneumococcal 20-valent conjugate vaccine (PREVNAR 20) injection 0.5 mL  0.5 mL Intramuscular Tomorrow-1000 Tat, Onalee Hua, MD       predniSONE (DELTASONE) tablet 50 mg  50 mg Oral Q breakfast Tat, David, MD   50 mg at 07/09/23 0737   rivaroxaban (XARELTO) tablet 20 mg  20 mg Oral Q supper Tat, Onalee Hua, MD   20 mg at 07/08/23 1730     Discharge Medications: TAKE these medications     albuterol 108 (90 Base) MCG/ACT inhaler Commonly known as: VENTOLIN HFA Inhale 2 puffs into the lungs every 6 (six) hours as needed for wheezing or shortness of breath.    amLODipine 5 MG tablet Commonly known as: NORVASC Take 1 tablet (5 mg total) by mouth daily. What changed:  medication strength how much to take    Atrovent HFA 17 MCG/ACT inhaler Generic drug: ipratropium Inhale 1 puff into the lungs every 4 (four) hours as needed for wheezing.    benztropine 0.5 MG tablet Commonly known as: COGENTIN Take 0.5 mg by mouth daily.    doxycycline 100 MG tablet Commonly known as: VIBRA-TABS Take 1 tablet (100 mg total) by mouth every 12 (twelve) hours for 5 days.    eucerin cream Apply topically as needed for wound care. Tac/euc 0.1% 1:4 Apply sparingly to rash on lower legs    Invega Trinza 410 MG/1. injection Generic drug: Paliperidone Palmitate ER Inject 410 mg into the muscle every 3 (three) months.    loratadine 10 MG tablet Commonly known as: CLARITIN Take 10 mg by mouth daily.    metoprolol tartrate 25 MG tablet Commonly known as: LOPRESSOR Take 25 mg by mouth 2 (two) times daily.    OXYGEN Inhale 3 L/min into the lungs at bedtime.    pantoprazole 40 MG tablet Commonly known as: PROTONIX Take 1 tablet (40 mg total) by mouth 2 (two) times daily. What changed: when to take this    predniSONE 20 MG tablet Commonly known as: DELTASONE Take Haldol 1 C8 2 tablets by mouth daily x 2 days; then 1 tablet by mouth daily x 3  days; then half tablet by mouth daily x 3 days and stop prednisone. Start taking on: July 10, 2023    risperiDONE 3 MG tablet Commonly known as: RISPERDAL Take 3 mg by mouth 2 (two) times daily.    rivaroxaban 20 MG Tabs tablet Commonly known as: XARELTO Take 1 tablet (20 mg total) by mouth daily with supper.    sodium chloride 1 g tablet Take 1 tablet (1 g total) by mouth daily. What changed: when to take this    triamcinolone  cream 0.1 % Commonly known as: KENALOG Apply 1 application topically 2 (two) times daily.    Relevant Imaging Results:  Relevant Lab Results:   Additional Information    Karn Cassis, LCSW

## 2023-07-09 NOTE — Plan of Care (Signed)

## 2023-07-09 NOTE — [place] FL2 (Signed)
[place] MEDICAID FL2 LEVEL OF CARE FORM     IDENTIFICATION  Patient Name: Nancy Erickson Birthdate: [birth year] Sex: female Admission Date (Current Location): 07/05/2023  [place] and [place] Number:  Aaron Edelman [id number] L Facility and Address:  [facility],  [address], [place] [address]      Provider Number: [id number]  Attending Physician Name and Address:  Vassie Loll, MD  Relative Name and Phone Number:       Current Level of Care: Hospital Recommended Level of Care: [facility] Prior Approval Number:    Date Approved/Denied:   PASRR Number:    Discharge Plan: Domiciliary (Rest home)    Current Diagnoses: Patient Active Problem List   Diagnosis Date Noted   Acute on chronic respiratory failure with hypoxia and hypercapnia (HCC) 07/06/2023   Acute on chronic respiratory failure with hypoxia (HCC) 07/05/2023   COPD (chronic obstructive pulmonary disease) (HCC) 02/08/2022   Hypoxemia 02/08/2022   Schizo affective schizophrenia (HCC) 02/08/2022   Encounter for intubation    Gastroesophageal reflux disease    Acute respiratory failure (HCC) 09/24/2017   Acute metabolic encephalopathy 09/23/2017   Insomnia    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   Cardiomyopathy (HCC)    Pulmonary hypertension (HCC)    Pulmonary embolus (HCC)    Paranoid schizophrenia (HCC)    Hypertension, uncontrolled 10/14/2014   Schizophrenia (HCC) 10/12/2013   Hyponatremia 10/12/2013   Altered mental state 10/12/2013   HTN (hypertension) 10/11/2013   Hypertensive urgency 10/11/2013    Orientation RESPIRATION BLADDER Height & Weight     Self, Time, Situation, Place  O2 (PRN during day, 2L at night) Incontinent Weight: 154 lb 8.7 oz (70.1 kg) Height:  5\' 4"  (162.6 cm)  BEHAVIORAL SYMPTOMS/MOOD NEUROLOGICAL BOWEL NUTRITION STATUS      Continent Diet (heart healthy)  AMBULATORY STATUS COMMUNICATION OF NEEDS Skin   Limited Assist Verbally Normal                        Personal Care Assistance Level of Assistance  Bathing, Feeding, Dressing Bathing Assistance: Limited assistance Feeding assistance: Limited assistance Dressing Assistance: Limited assistance     Functional Limitations Info  Sight, Hearing, Speech Sight Info: Adequate Hearing Info: Adequate Speech Info: Adequate    SPECIAL CARE FACTORS FREQUENCY                       Contractures Contractures Info: Not present    Additional Factors Info  Code Status, Allergies, Psychotropic Code Status Info: Full Allergies Info: Phosphate, Chocolate, Artane, Codeine Psychotropic Info: Risperdal         Current Medications (07/09/2023):  This is the current hospital active medication list Current Facility-Administered Medications  Medication Dose Route Frequency Provider Last Rate Last Admin   acetaminophen (TYLENOL) tablet 650 mg  650 mg Oral Q6H PRN Tat, David, MD       Or   acetaminophen (TYLENOL) suppository 650 mg  650 mg Rectal Q6H PRN Tat, Onalee Hua, MD       benztropine (COGENTIN) tablet 0.5 mg  0.5 mg Oral Daily Tat, David, MD   0.5 mg at 07/09/23 9562   Chlorhexidine Gluconate Cloth 2 % PADS 6 each  6 each Topical Daily Tat, David, MD   6 each at 07/08/23 0404   doxycycline (VIBRA-TABS) tablet 100 mg  100 mg Oral Pablo Ledger, MD   100 mg at 07/09/23 0856   haloperidol lactate (HALDOL) injection 5 mg  5 mg Intravenous Q6H PRN Tat, Onalee Hua, MD   5 mg at 07/06/23 0327   ipratropium-albuterol (DUONEB) 0.5-2.5 (3) MG/3ML nebulizer solution 3 mL  3 mL Nebulization Q4H PRN Tat, Onalee Hua, MD       metoprolol tartrate (LOPRESSOR) tablet 25 mg  25 mg Oral BID Tat, David, MD   25 mg at 07/09/23 0855   ondansetron (ZOFRAN) tablet 4 mg  4 mg Oral Q6H PRN Tat, Onalee Hua, MD       Or   ondansetron (ZOFRAN) injection 4 mg  4 mg Intravenous Q6H PRN Tat, Onalee Hua, MD       Oral care mouth rinse  15 mL Mouth Rinse PRN Tat, Onalee Hua, MD       pantoprazole (PROTONIX) injection 40 mg  40 mg  Intravenous Q24H Tat, Onalee Hua, MD   40 mg at 07/09/23 0856   pneumococcal 20-valent conjugate vaccine (PREVNAR 20) injection 0.5 mL  0.5 mL Intramuscular Tomorrow-1000 Tat, Onalee Hua, MD       predniSONE (DELTASONE) tablet 50 mg  50 mg Oral Q breakfast Tat, David, MD   50 mg at 07/09/23 8413   rivaroxaban (XARELTO) tablet 20 mg  20 mg Oral Q supper Tat, Onalee Hua, MD   20 mg at 07/08/23 1730     Discharge Medications: TAKE these medications     albuterol 108 (90 Base) MCG/ACT inhaler Commonly known as: VENTOLIN HFA Inhale 2 puffs into the lungs every 6 (six) hours as needed for wheezing or shortness of breath.    amLODipine 5 MG tablet Commonly known as: NORVASC Take 1 tablet (5 mg total) by mouth daily. What changed:  medication strength how much to take    Atrovent HFA 17 MCG/ACT inhaler Generic drug: ipratropium Inhale 1 puff into the lungs every 4 (four) hours as needed for wheezing.    benztropine 0.5 MG tablet Commonly known as: COGENTIN Take 0.5 mg by mouth daily.    doxycycline 100 MG tablet Commonly known as: VIBRA-TABS Take 1 tablet (100 mg total) by mouth every 12 (twelve) hours for 5 days.    eucerin cream Apply topically as needed for wound care. Tac/euc 0.1% 1:4 Apply sparingly to rash on lower legs    Invega Trinza 410 MG/1. injection Generic drug: Paliperidone Palmitate ER Inject 410 mg into the muscle every 3 (three) months.    loratadine 10 MG tablet Commonly known as: CLARITIN Take 10 mg by mouth daily.    metoprolol tartrate 25 MG tablet Commonly known as: LOPRESSOR Take 25 mg by mouth 2 (two) times daily.    OXYGEN Inhale 3 L/min into the lungs at bedtime.    pantoprazole 40 MG tablet Commonly known as: PROTONIX Take 1 tablet (40 mg total) by mouth 2 (two) times daily. What changed: when to take this    predniSONE 20 MG tablet Commonly known as: DELTASONE Take 2 tablets by mouth daily x 2 days; then 1 tablet by mouth daily x 3 days; then half  tablet by mouth daily x 3 days and stop prednisone. Start taking on: July 10, 2023    risperiDONE 3 MG tablet Commonly known as: RISPERDAL Take 3 mg by mouth 2 (two) times daily.    rivaroxaban 20 MG Tabs tablet Commonly known as: XARELTO Take 1 tablet (20 mg total) by mouth daily with supper.    sodium chloride 1 g tablet Take 1 tablet (1 g total) by mouth daily. What changed: when to take this    triamcinolone cream 0.1 %  Commonly known as: KENALOG Apply 1 application topically 2 (two) times daily.    Relevant Imaging Results:  Relevant Lab Results:   Additional Information    Karn Cassis, LCSW

## 2023-07-09 NOTE — Progress Notes (Signed)
  Echocardiogram 2D Echocardiogram has been performed.  Reinaldo Raddle Draydon [place] 07/09/2023, 11:33 AM

## 2023-07-09 NOTE — Discharge Summary (Addendum)
Physician Discharge Summary   Patient: Nancy Erickson MRN: [id number] DOB: 07-31-1956  Admit date:     07/05/2023  Discharge date: 07/09/23  Discharge Physician: Vassie Loll   PCP: Henrine Screws, MD   Recommendations at discharge:  Repeat basic metabolic panel to follow ultralights renal function Repeat chest x-ray in 6 weeks to assure resolution of bronchitic changes. Reassess blood pressure and adjust antihypertensive treatment as needed  Discharge Diagnoses: Principal Problem:   Acute on chronic respiratory failure with hypoxia (HCC) Active Problems:   HTN (hypertension)   Hyponatremia   Altered mental state   Paranoid schizophrenia (HCC)   Psychogenic polydipsia   Acute metabolic encephalopathy   Gastroesophageal reflux disease   COPD (chronic obstructive pulmonary disease) (HCC)   Acute on chronic respiratory failure with hypoxia and hypercapnia (HCC) Chronic diastolic heart failure  Brief Hospital admission course: 67 year old female with a history of pulmonary embolus on the rivaroxaban, hypertension, pulmonary hypertension, paranoid schizophrenia, primary polydipsia, chronic respiratory failure on 3 L, tobacco abuse in remission presenting from [facility] secondary to altered mental status and shortness of breath.  The patient was noted to have increasing confusion and nonsensical speech on 07/04/2023.  In the morning of 07/05/2023, the patient was found sitting on the floor of her room confused.  Apparently the patient "looked blue".  The patient has had noncompliance with her oxygen.  She frequently takes it off.  Patient continues to have problems with polydipsia.   caregiver confirms that pt continues to have problems with excessive water intake. He states at one point her primary MD had written orders to restrict her intake and the only way to comply was to turn the water source off to the sink in her bathroom. However, state inspection would not allow this to  remain off.    In the ED, the patient was afebrile and hemodynamically stable.  Oxygen saturation was 59% on room air.  She was placed on nonrebreather.  Repeat ABG on 07/05/2023 1935 hrs. showed 7.29/85/75/40.  Because of increasing pCO2 and some somnolence, the patient was placed on BiPAP.   Apparently, the patient had periods of agitation overnight 11/30-12/1/24.  The patient was given Haldol 5 mg IV at 2028 and 0327.  She was given IV ativan 1 mg at 1908, 0005, 0405.   At the time of my evaluation on 07/06/2023, the patient remains on BiPAP.    Assessment and Plan: Acute on chronic respiratory failure with hypoxia -multifactorial including COPD exac, fluid overload and possible bronchitis/bronchiectasis. -Check PCT <0.10 x 2 -BNP 93 -07/05/2023 CT chest--small bilateral pleural effusions, emphysema, no frank consolidations, no mass; positive bronchitic changes appreciated. -Currently on BiPAP>>now on 4L>>2L -Continue DuoNebs -COVID/RSV/flu--neg -viral resp panel -Given concerns for slight vascular congestion patient received 2 doses of Lasix throughout hospitalization. -Complete empirical doxycycline and a steroid tapering -2D echo demonstrating preserved ejection fraction, no wall motion abnormalities and grade 1 diastolic dysfunction -Heart healthy diet discussed with patient. -Adequate hydration recommended. -At discharge good saturation appreciated on room air; patient will continue the use of oxygen at bedtime and PRN during the day as previously instructed.   Acute metabolic encephalopathy -Likely multifactorial including hyponatremia, hypercarbia -Checked ammonia--51 -B12--340 -TSH--0.947 -07/05/2023 CT brain negative for acute findings -Obtained UA--neg pyuria -UDS--neg -continue to be judicious with any sedative agent. -mentation improved and back to baseline at time of discharge.  Chronic diastolic dysfunction -Grade 1 diastolic dysfunction appreciated on 2D  echo -No crackles on exam -BNP within normal limit -  Given concern for vascular congestion on chest x-ray 2 doses of Lasix provided throughout hospitalization -Will recommend close monitoring of patient's sodium intake and continue checking daily weights.   Hyponatremia -Presented with sodium 119>>130>>132>>134 -Patient has chronic hyponatremia with baseline 125-130 -Most likely associated with chronic psychotropic medications usage. -Urine osmolarity 356 -Serum osmolarity 257 -FeNa 0.2% -Continue adjusted dose of sodium tablets.   History of pulmonary embolus -restart rivaroxaban   Essential hypertension -Stable overall -Resume home antihypertensive regimen at adjusted dosages -Heart healthy diet discussed with patient.   Paranoid schizophrenia -Continue home psychotropic medications -No suicidal ideation or hallucinations at discharge.  -Following commands appropriately.  Consultants: None Procedures performed: See below for x-ray report; 2D echo. Disposition: Assisted living Diet recommendation: Heart healthy diet.  DISCHARGE MEDICATION: Allergies as of 07/09/2023       Reactions   Phosphate Nausea And Vomiting   Chocolate Swelling   Facial swelling and bumps   Artane [trihexyphenidyl] Other (See Comments)   unknown   Codeine Other (See Comments)   unknown        Medication List     TAKE these medications    albuterol 108 (90 Base) MCG/ACT inhaler Commonly known as: VENTOLIN HFA Inhale 2 puffs into the lungs every 6 (six) hours as needed for wheezing or shortness of breath.   amLODipine 5 MG tablet Commonly known as: NORVASC Take 1 tablet (5 mg total) by mouth daily. What changed:  medication strength how much to take   Atrovent HFA 17 MCG/ACT inhaler Generic drug: ipratropium Inhale 1 puff into the lungs every 4 (four) hours as needed for wheezing.   benztropine 0.5 MG tablet Commonly known as: COGENTIN Take 0.5 mg by mouth daily.    doxycycline 100 MG tablet Commonly known as: VIBRA-TABS Take 1 tablet (100 mg total) by mouth every 12 (twelve) hours for 5 days.   eucerin cream Apply topically as needed for wound care. Tac/euc 0.1% 1:4 Apply sparingly to rash on lower legs   Invega Trinza 410 MG/1. injection Generic drug: Paliperidone Palmitate ER Inject 410 mg into the muscle every 3 (three) months.   loratadine 10 MG tablet Commonly known as: CLARITIN Take 10 mg by mouth daily.   metoprolol tartrate 25 MG tablet Commonly known as: LOPRESSOR Take 25 mg by mouth 2 (two) times daily.   OXYGEN Inhale 3 L/min into the lungs at bedtime.   pantoprazole 40 MG tablet Commonly known as: PROTONIX Take 1 tablet (40 mg total) by mouth 2 (two) times daily. What changed: when to take this   predniSONE 20 MG tablet Commonly known as: DELTASONE Take 2 tablets by mouth daily x 2 days; then 1 tablet by mouth daily x 3 days; then half tablet by mouth daily x 3 days and stop prednisone. Start taking on: July 10, 2023   risperiDONE 3 MG tablet Commonly known as: RISPERDAL Take 3 mg by mouth 2 (two) times daily.   rivaroxaban 20 MG Tabs tablet Commonly known as: XARELTO Take 1 tablet (20 mg total) by mouth daily with supper.   sodium chloride 1 g tablet Take 1 tablet (1 g total) by mouth daily. What changed: when to take this   triamcinolone cream 0.1 % Commonly known as: KENALOG Apply 1 application topically 2 (two) times daily.        Follow-up Information     Henrine Screws, MD. Schedule an appointment as soon as possible for a visit in 10 day(s).   Specialty: Family Medicine  Contact information: [address] [place] [address] [phone]                Discharge Exam: Ceasar Mons Weights   07/05/23 1404 07/08/23 0500  Weight: 69.9 kg 70.1 kg   General exam: Alert, awake, following commands appropriately and in no acute distress. Respiratory system: Good air movement  bilaterally; no significant wheezing, no crackles, no using accessory muscle.  Good saturation on room air while at rest. Cardiovascular system:RRR. No rubs or gallops; no JVD. Gastrointestinal system: Abdomen is nondistended, soft and nontender. No organomegaly or masses felt. Normal bowel sounds heard. Central nervous system: Moving 4 limbs spontaneously.  No focal neurological deficits. Extremities: No cyanosis, clubbing or edema. Skin: No petechiae. Psychiatry: Judgement and insight appear at baseline; mood and affect appropriate.  Condition at discharge: Stable and improved.  The results of significant diagnostics from this hospitalization (including imaging, microbiology, ancillary and laboratory) are listed below for reference.   Imaging Studies: ECHOCARDIOGRAM COMPLETE  Result Date: 07/09/2023    ECHOCARDIOGRAM REPORT   Patient Name:   Nancy Erickson Date of Exam: 07/09/2023 Medical Rec #:  [id number]         Height:       64.0 in Accession #:    [id number]        Weight:       154.5 lb Date of Birth:  22-Dec-1955        BSA:          1.753 m Patient Age:    67 years          BP:           140/80 mmHg Patient Gender: F                 HR:           77 bpm. [facility] Procedure: 2D Echo, Cardiac Doppler and Color Doppler Indications:    Dyspnea  History:        Patient has prior history of Echocardiogram examinations, most                 recent 10/15/2014. Risk Factors:Former Smoker.  Sonographer:    Karma Ganja Referring Phys: [id number] DAVID TAT IMPRESSIONS  1. Left ventricular ejection fraction, by estimation, is 55 to 60%. The left ventricle has normal function. The left ventricle has no regional wall motion abnormalities. Left ventricular diastolic parameters are consistent with Grade I diastolic dysfunction (impaired relaxation). There is the interventricular septum is flattened in systole and diastole, consistent with right ventricular pressure and volume overload.  2. Right  ventricular systolic function is moderately reduced. The right ventricular size is mildly enlarged. There is mildly elevated pulmonary artery systolic pressure. The estimated right ventricular systolic pressure is 40.0 mmHg (potentially underestimated).  3. Right atrial size was mildly dilated.  4. The mitral valve is grossly normal. Trivial mitral valve regurgitation.  5. The aortic valve is tricuspid. Aortic valve regurgitation is not visualized. No aortic stenosis is present. Aortic valve mean gradient measures 4.0 mmHg.  6. The inferior vena cava is dilated in size with <50% respiratory variability, suggesting right atrial pressure of 15 mmHg. Comparison(s): Prior images unable to be directly viewed. FINDINGS  Left Ventricle: Left ventricular ejection fraction, by estimation, is 55 to 60%. The left ventricle has normal function. The left ventricle has no regional wall motion abnormalities. The left ventricular internal cavity size was normal in size. There  is  no left ventricular hypertrophy. The interventricular septum is flattened in systole and diastole, consistent with right ventricular pressure and volume overload. Left ventricular diastolic parameters are consistent with Grade I diastolic dysfunction (impaired relaxation). Right Ventricle: The right ventricular size is mildly enlarged. No increase in right ventricular wall thickness. Right ventricular systolic function is moderately reduced. There is mildly elevated pulmonary artery systolic pressure. The tricuspid regurgitant velocity is 2.50 m/s, and with an assumed right atrial pressure of 15 mmHg, the estimated right ventricular systolic pressure is 40.0 mmHg. Left Atrium: Left atrial size was normal in size. Right Atrium: Right atrial size was mildly dilated. Pericardium: Trivial pericardial effusion is present. The pericardial effusion is anterior to the right ventricle. Mitral Valve: The mitral valve is grossly normal. Trivial mitral valve  regurgitation. Tricuspid Valve: The tricuspid valve is grossly normal. Tricuspid valve regurgitation is mild. Aortic Valve: The aortic valve is tricuspid. There is mild aortic valve annular calcification. Aortic valve regurgitation is not visualized. No aortic stenosis is present. Aortic valve mean gradient measures 4.0 mmHg. Aortic valve peak gradient measures 7.2 mmHg. Aortic valve area, by VTI measures 2.31 cm. Pulmonic Valve: The pulmonic valve was not well visualized. Pulmonic valve regurgitation is not visualized. Aorta: The aortic root and ascending aorta are structurally normal, with no evidence of dilitation. Venous: The inferior vena cava is dilated in size with less than 50% respiratory variability, suggesting right atrial pressure of 15 mmHg. IAS/Shunts: No atrial level shunt detected by color flow Doppler.  LEFT VENTRICLE PLAX 2D LVIDd:         5.40 cm     Diastology LVIDs:         4.10 cm     LV e' medial:    8.27 cm/s LV PW:         0.90 cm     LV E/e' medial:  7.8 LV IVS:        0.90 cm     LV e' lateral:   9.25 cm/s LVOT diam:     2.00 cm     LV E/e' lateral: 7.0 LV SV:         57 LV SV Index:   33 LVOT Area:     3.14 cm  LV Volumes (MOD) LV vol d, MOD A2C: 86.2 ml LV vol d, MOD A4C: 84.3 ml LV vol s, MOD A2C: 46.3 ml LV vol s, MOD A4C: 32.3 ml LV SV MOD A2C:     39.9 ml LV SV MOD A4C:     84.3 ml LV SV MOD BP:      50.6 ml RIGHT VENTRICLE            IVC RV Basal diam:  4.00 cm    IVC diam: 2.60 cm RV S prime:     9.57 cm/s TAPSE (M-mode): 2.2 cm LEFT ATRIUM           Index        RIGHT ATRIUM           Index LA diam:      3.90 cm 2.22 cm/m   RA Area:     20.50 cm LA Vol (A2C): 44.6 ml 25.44 ml/m  RA Volume:   65.40 ml  37.30 ml/m LA Vol (A4C): 23.1 ml 13.18 ml/m  AORTIC VALVE AV Area (Vmax):    2.30 cm AV Area (Vmean):   2.26 cm AV Area (VTI):     2.31 cm AV Vmax:  134.00 cm/s AV Vmean:          88.200 cm/s AV VTI:            0.249 m AV Peak Grad:      7.2 mmHg AV Mean Grad:       4.0 mmHg LVOT Vmax:         98.10 cm/s LVOT Vmean:        63.400 cm/s LVOT VTI:          0.183 m LVOT/AV VTI ratio: 0.73  AORTA Ao Root diam: 2.70 cm Ao Asc diam:  2.40 cm MITRAL VALVE               TRICUSPID VALVE MV Area (PHT): 3.46 cm    TR Peak grad:   25.0 mmHg MV Decel Time: 219 msec    TR Vmax:        250.00 cm/s MV E velocity: 64.30 cm/s MV A velocity: 82.70 cm/s  SHUNTS MV E/A ratio:  0.78        Systemic VTI:  0.18 m                            Systemic Diam: 2.00 cm Nona Dell MD Electronically signed by Nona Dell MD Signature Date/Time: 07/09/2023/1:29:17 PM    Final    DG CHEST PORT 1 VIEW  Result Date: 07/07/2023 CLINICAL DATA:  [id number] Acute respiratory failure with hypoxia and hypercarbia (HCC) [id number] EXAM: PORTABLE CHEST 1 VIEW COMPARISON:  07/06/2023 FINDINGS: Persistent hazy densities at the left lung base that could represent a small amount of pleural fluid and atelectasis. Coarse lung markings are similar to the previous examination. Heart size is upper limits of normal but stable. Negative for a pneumothorax. Old left rib fractures. IMPRESSION: 1. Minimal change from the recent comparison examination. Persistent left basilar densities. Electronically Signed   By: Richarda Overlie M.D.   On: 07/07/2023 07:56   DG CHEST PORT 1 VIEW  Result Date: 07/06/2023 CLINICAL DATA:  Acute respiratory failure with hypoxia and hypercarbia. EXAM: PORTABLE CHEST 1 VIEW COMPARISON:  One-view chest x-ray 07/05/2023.  CT chest 07/05/2023. FINDINGS: Heart is mildly enlarged. Progressive bilateral pleural effusions and basilar airspace disease is present. Mild pulmonary vascular congestion is again noted. IMPRESSION: 1. Progressive bilateral pleural effusions and basilar airspace disease. 2. Mild pulmonary vascular congestion. Electronically Signed   By: Marin Roberts M.D.   On: 07/06/2023 08:39   CT Head Wo Contrast  Result Date: 07/05/2023 CLINICAL DATA:  Mental status change, unknown  cause EXAM: CT HEAD WITHOUT CONTRAST TECHNIQUE: Contiguous axial images were obtained from the base of the skull through the vertex without intravenous contrast. RADIATION DOSE REDUCTION: This exam was performed according to the departmental dose-optimization program which includes automated exposure control, adjustment of the mA and/or kV according to patient size and/or use of iterative reconstruction technique. COMPARISON:  09/23/2017 FINDINGS: Brain: No evidence of acute infarction, hemorrhage, hydrocephalus, extra-axial collection or mass lesion/mass effect. Vascular: No hyperdense vessel or unexpected calcification. Skull: Normal. Negative for fracture or focal lesion. Sinuses/Orbits: No acute finding. Other: None. IMPRESSION: No acute intracranial findings. Electronically Signed   By: Duanne Guess D.O.   On: 07/05/2023 17:19   CT Chest W Contrast  Result Date: 07/05/2023 CLINICAL DATA:  Abnormal chest x-ray EXAM: CT CHEST WITH CONTRAST TECHNIQUE: Multidetector CT imaging of the chest was performed during intravenous contrast administration. RADIATION DOSE REDUCTION: This exam was  performed according to the departmental dose-optimization program which includes automated exposure control, adjustment of the mA and/or kV according to patient size and/or use of iterative reconstruction technique. CONTRAST:  OMNIPAQUE IOHEXOL 300 MG/ML  SOLN COMPARISON:  One-view chest x-ray 07/05/2023. Two-view chest x-ray 02/04/2023. FINDINGS: Cardiovascular: The heart size is normal. Coronary artery calcifications are present. Atherosclerotic calcifications are present the aortic arch great vessel origins. Pulmonary arteries are enlarged measuring 3.1 cm on the right and 2.5 cm on the left, similar the prior studies. Mediastinum/Nodes: Breaking scratched at paratracheal lymph nodes up to 9 mm are stable from the prior study. Subcentimeter prevascular nodes are present. No hilar mass is present. Lungs/Pleura:  Centrilobular emphysematous changes are present. Small bilateral pleural effusions are present. No focal nodule or mass lesion is present. Mild dependent atelectasis is associated with the effusions. Upper Abdomen: The visualized upper abdomen is unremarkable. Musculoskeletal: No chest wall abnormality. No acute or significant osseous findings. IMPRESSION: 1. Small bilateral pleural effusions with mild dependent atelectasis. 2. No focal nodule or mass lesion. 3. Enlarged pulmonary arteries bilaterally compatible with pulmonary arterial hypertension. 4. Coronary artery disease. 5. Aortic Atherosclerosis (ICD10-I70.0) and Emphysema (ICD10-J43.9). Electronically Signed   By: Marin Roberts M.D.   On: 07/05/2023 15:50   DG Chest Portable 1 View  Result Date: 07/05/2023 CLINICAL DATA:  hypoxia EXAM: PORTABLE CHEST 1 VIEW COMPARISON:  Chest x-ray 02/04/2023, CT chest 09/23/2017 FINDINGS: The heart and mediastinal contours are within normal limits. Question right hilar mass. Interval increase in interstitial markings. Blunting of bilateral costophrenic angles with no definite pleural effusion. No pneumothorax. No acute osseous abnormality.  Old healed right rib fractures. IMPRESSION: Question right hilar mass. Interval increase in interstitial markings. Recommend CT with intravenous contrast for further evaluation (not CTPA). Electronically Signed   By: Tish Frederickson M.D.   On: 07/05/2023 14:27   DG Wrist Navic Only Right  Result Date: 06/18/2023 X-ray report Chief complaint possible scaphoid fracture Images scaphoid only Reading: No evidence of scaphoid fracture Impression: Normal scaphoid distal radius fracture noted with ulnar styloid fracture see other films as noted   DG Wrist Complete Right  Result Date: 06/18/2023 X-ray report Chief complaint right wrist fracture right wrist pain Images 3 views right wrist Reading: Fracture distal radius metaphyseal slight shortening minimal angulation  Impression: Distal radius fracture ulnar styloid also involved by the way typical Colles' type fracture   DG Wrist Complete Right  Result Date: 06/12/2023 CLINICAL DATA:  Fall. EXAM: RIGHT WRIST - COMPLETE 3+ VIEW COMPARISON:  None Available. FINDINGS: Acute transverse mildly impacted distal radial metaphyseal fracture with extension to the level of the distal radioulnar joint. Minimally displaced fracture at the base of the ulnar styloid. Subtle cortical irregularity at the waist of the scaphoid is concerning for a nondisplaced fracture. Normal carpal alignment. Diffuse soft tissue swelling of the distal forearm and wrist. No radiopaque foreign body. IMPRESSION: 1. Acute transverse mildly impacted distal radial metaphyseal fracture with extension to the level of the distal radioulnar joint. 2. Minimally displaced fracture at the base of the ulnar styloid. 3. Subtle cortical irregularity at the waist of the scaphoid is concerning for a nondisplaced fracture. A CT could be obtained for further evaluation. Electronically Signed   By: Hart Robinsons M.D.   On: 06/12/2023 16:32    Microbiology: Results for orders placed or performed during the hospital encounter of 07/05/23  Resp panel by RT-PCR (RSV, Flu A&B, Covid) Anterior Nasal Swab     Status:  None   Collection Time: 07/05/23  2:36 PM   Specimen: Anterior Nasal Swab  Result Value Ref Range Status   SARS Coronavirus 2 by RT PCR NEGATIVE NEGATIVE Final    Comment: (NOTE) SARS-CoV-2 target nucleic acids are NOT DETECTED.  The SARS-CoV-2 RNA is generally detectable in upper respiratory specimens during the acute phase of infection. The lowest concentration of SARS-CoV-2 viral copies this assay can detect is 138 copies/mL. A negative result does not preclude SARS-Cov-2 infection and should not be used as the sole basis for treatment or other patient management decisions. A negative result may occur with  improper specimen collection/handling,  submission of specimen other than nasopharyngeal swab, presence of viral mutation(s) within the areas targeted by this assay, and inadequate number of viral copies(<138 copies/mL). A negative result must be combined with clinical observations, patient history, and epidemiological information. The expected result is Negative.  Fact Sheet for Patients:  BloggerCourse.com  Fact Sheet for Healthcare Providers:  SeriousBroker.it  This test is no t yet approved or cleared by the [place] FDA and  has been authorized for detection and/or diagnosis of SARS-CoV-2 by FDA under an Emergency Use Authorization (EUA). This EUA will remain  in effect (meaning this test can be used) for the duration of the COVID-19 declaration under Section 564(b)(1) of the Act, 21 U.S.C.section 360bbb-3(b)(1), unless the authorization is terminated  or revoked sooner.       Influenza A by PCR NEGATIVE NEGATIVE Final   Influenza B by PCR NEGATIVE NEGATIVE Final    Comment: (NOTE) The Xpert Xpress SARS-CoV-2/FLU/RSV plus assay is intended as an aid in the diagnosis of influenza from Nasopharyngeal swab specimens and should not be used as a sole basis for treatment. Nasal washings and aspirates are unacceptable for Xpert Xpress SARS-CoV-2/FLU/RSV testing.  Fact Sheet for Patients: BloggerCourse.com  Fact Sheet for Healthcare Providers: SeriousBroker.it  This test is not yet approved or cleared by the [place] FDA and has been authorized for detection and/or diagnosis of SARS-CoV-2 by FDA under an Emergency Use Authorization (EUA). This EUA will remain in effect (meaning this test can be used) for the duration of the COVID-19 declaration under Section 564(b)(1) of the Act, 21 U.S.C. section 360bbb-3(b)(1), unless the authorization is terminated or revoked.     Resp Syncytial Virus by PCR NEGATIVE  NEGATIVE Final    Comment: (NOTE) Fact Sheet for Patients: BloggerCourse.com  Fact Sheet for Healthcare Providers: SeriousBroker.it  This test is not yet approved or cleared by the [place] FDA and has been authorized for detection and/or diagnosis of SARS-CoV-2 by FDA under an Emergency Use Authorization (EUA). This EUA will remain in effect (meaning this test can be used) for the duration of the COVID-19 declaration under Section 564(b)(1) of the Act, 21 U.S.C. section 360bbb-3(b)(1), unless the authorization is terminated or revoked.  Performed at [facility], [address], [place], [place] [address]   MRSA Next Gen by PCR, Nasal     Status: None   Collection Time: 07/05/23  7:00 PM   Specimen: Nasal Mucosa; Nasal Swab  Result Value Ref Range Status   MRSA by PCR Next Gen NOT DETECTED NOT DETECTED Final    Comment: (NOTE) The GeneXpert MRSA Assay (FDA approved for NASAL specimens only), is one component of a comprehensive MRSA colonization surveillance program. It is not intended to diagnose MRSA infection nor to guide or monitor treatment for MRSA infections. Test performance is not FDA approved in patients less than  65 years old. Performed at [facility], [address] [place], [place] [address]   Respiratory (~20 pathogens) panel by PCR     Status: None   Collection Time: 07/07/23  2:00 PM   Specimen: Nasopharyngeal Swab; Respiratory  Result Value Ref Range Status   Adenovirus NOT DETECTED NOT DETECTED Final   Coronavirus 229E NOT DETECTED NOT DETECTED Final    Comment: (NOTE) The Coronavirus on the Respiratory Panel, DOES NOT test for the novel  Coronavirus (2019 nCoV)    Coronavirus HKU1 NOT DETECTED NOT DETECTED Final   Coronavirus NL63 NOT DETECTED NOT DETECTED Final   Coronavirus OC43 NOT DETECTED NOT DETECTED Final   Metapneumovirus NOT DETECTED NOT DETECTED Final   Rhinovirus / Enterovirus NOT DETECTED  NOT DETECTED Final   Influenza A NOT DETECTED NOT DETECTED Final   Influenza B NOT DETECTED NOT DETECTED Final   Parainfluenza Virus 1 NOT DETECTED NOT DETECTED Final   Parainfluenza Virus 2 NOT DETECTED NOT DETECTED Final   Parainfluenza Virus 3 NOT DETECTED NOT DETECTED Final   Parainfluenza Virus 4 NOT DETECTED NOT DETECTED Final   Respiratory Syncytial Virus NOT DETECTED NOT DETECTED Final   Bordetella pertussis NOT DETECTED NOT DETECTED Final   Bordetella Parapertussis NOT DETECTED NOT DETECTED Final   Chlamydophila pneumoniae NOT DETECTED NOT DETECTED Final   Mycoplasma pneumoniae NOT DETECTED NOT DETECTED Final    Comment: Performed at [facility], [address] [place], [place] [address]    Labs: CBC: Recent Labs  Lab 07/06/23 [phone] 07/07/23 0426 07/08/23 0441 07/09/23 0408  WBC 6.6 10.7* 11.7* 10.7*  HGB 12.9 13.0 12.9 13.2  HCT 40.1 40.9 40.9 41.2  MCV 92.2 92.5 93.6 93.2  PLT 282 299 298 305   Basic Metabolic Panel: Recent Labs  Lab 07/05/23 1349 07/05/23 2020 07/06/23 0514 07/06/23 0821 07/07/23 0103 07/07/23 0426 07/07/23 0935 07/08/23 0441 07/09/23 0408  NA 119*   < > 129*   < > 130* 130* 132* 134* 134*  K 4.0  --  4.1  --   --  4.2  --  3.8 3.5  CL 78*  --  85*  --   --  84*  --  82* 86*  CO2 33*  --  36*  --   --  39*  --  42* 40*  GLUCOSE 126*  --  134*  --   --  135*  --  143* 92  BUN 10  --  8  --   --  8  --  12 14  CREATININE 0.43*  --  0.46  --   --  0.41*  --  0.54 0.59  CALCIUM 8.3*  --  8.5*  --   --  8.4*  --  8.7* 8.6*  MG  --   --   --   --   --  2.1  --  2.1  --   PHOS  --   --   --   --   --  3.1  --  3.5  --    < > = values in this interval not displayed.    Discharge time spent: greater than 30 minutes.  Signed: Vassie Loll, MD [facility] 07/09/2023

## 2023-07-09 NOTE — Progress Notes (Signed)
Discharge instructions and paperwork given to patient's brother

## 2023-07-09 NOTE — Progress Notes (Signed)
Mobility Specialist Progress Note:    07/09/23 0940  Mobility  Activity Ambulated with assistance in hallway;Transferred from bed to chair;Ambulated with assistance in room;Ambulated with assistance to bathroom  Level of Assistance Contact guard assist, steadying assist  Assistive Device None  Distance Ambulated (ft) 120 ft  Range of Motion/Exercises Active;All extremities  Activity Response Tolerated well  Mobility Referral Yes  $Mobility charge 1 Mobility  Mobility Specialist Start Time (ACUTE ONLY) 0920  Mobility Specialist Stop Time (ACUTE ONLY) 0940  Mobility Specialist Time Calculation (min) (ACUTE ONLY) 20 min   Pt received in bed, agreeable to mobility. Required CGA to stand and ambulate with no AD.Tolerated well, pt received without [place]. SPO2 82% on RA. Placed [place] on pt, SpO2 93% on 2L. Left in chair, alarm on and call bell in reach. All needs met.   Lawerance Bach Mobility Specialist Please contact via Special educational needs teacher or  Rehab office at [phone]

## 2023-07-15 DIAGNOSIS — S52531A Colles' fracture of right radius, initial encounter for closed fracture: Secondary | ICD-10-CM | POA: Insufficient documentation

## 2023-07-17 ENCOUNTER — Encounter: Payer: Medicare Other | Admitting: Orthopedic Surgery

## 2023-07-28 ENCOUNTER — Encounter: Payer: Medicare Other | Admitting: Orthopedic Surgery

## 2023-08-06 DEATH — deceased
